# Patient Record
Sex: Female | Born: 1970 | Race: White | Hispanic: No | Smoking: Former smoker
Health system: Southern US, Community
[De-identification: ages and names within clinical notes are randomized; demographics above are authoritative.]

## PROBLEM LIST (undated history)

## (undated) DIAGNOSIS — C801 Malignant (primary) neoplasm, unspecified: Secondary | ICD-10-CM

## (undated) DIAGNOSIS — J449 Chronic obstructive pulmonary disease, unspecified: Secondary | ICD-10-CM

## (undated) DIAGNOSIS — F419 Anxiety disorder, unspecified: Secondary | ICD-10-CM

## (undated) DIAGNOSIS — T8859XA Other complications of anesthesia, initial encounter: Secondary | ICD-10-CM

## (undated) DIAGNOSIS — M797 Fibromyalgia: Secondary | ICD-10-CM

## (undated) DIAGNOSIS — M1990 Unspecified osteoarthritis, unspecified site: Secondary | ICD-10-CM

## (undated) DIAGNOSIS — K589 Irritable bowel syndrome without diarrhea: Secondary | ICD-10-CM

## (undated) DIAGNOSIS — J189 Pneumonia, unspecified organism: Secondary | ICD-10-CM

## (undated) DIAGNOSIS — F909 Attention-deficit hyperactivity disorder, unspecified type: Secondary | ICD-10-CM

## (undated) DIAGNOSIS — IMO0001 Reserved for inherently not codable concepts without codable children: Secondary | ICD-10-CM

## (undated) DIAGNOSIS — G709 Myoneural disorder, unspecified: Secondary | ICD-10-CM

## (undated) DIAGNOSIS — G5640 Causalgia of unspecified upper limb: Secondary | ICD-10-CM

## (undated) DIAGNOSIS — T4145XA Adverse effect of unspecified anesthetic, initial encounter: Secondary | ICD-10-CM

## (undated) DIAGNOSIS — M792 Neuralgia and neuritis, unspecified: Secondary | ICD-10-CM

## (undated) DIAGNOSIS — Z8719 Personal history of other diseases of the digestive system: Secondary | ICD-10-CM

## (undated) DIAGNOSIS — F329 Major depressive disorder, single episode, unspecified: Secondary | ICD-10-CM

## (undated) DIAGNOSIS — R519 Headache, unspecified: Secondary | ICD-10-CM

## (undated) DIAGNOSIS — F32A Depression, unspecified: Secondary | ICD-10-CM

## (undated) DIAGNOSIS — J45909 Unspecified asthma, uncomplicated: Secondary | ICD-10-CM

## (undated) DIAGNOSIS — K219 Gastro-esophageal reflux disease without esophagitis: Secondary | ICD-10-CM

## (undated) DIAGNOSIS — D649 Anemia, unspecified: Secondary | ICD-10-CM

## (undated) DIAGNOSIS — R51 Headache: Secondary | ICD-10-CM

## (undated) DIAGNOSIS — Z8489 Family history of other specified conditions: Secondary | ICD-10-CM

## (undated) DIAGNOSIS — G8929 Other chronic pain: Secondary | ICD-10-CM

## (undated) HISTORY — PX: WISDOM TOOTH EXTRACTION: SHX21

## (undated) HISTORY — PX: BUNIONECTOMY: SHX129

## (undated) HISTORY — PX: COLONOSCOPY: SHX174

## (undated) HISTORY — PX: TUBAL LIGATION: SHX77

## (undated) HISTORY — PX: DILATION AND CURETTAGE OF UTERUS: SHX78

## (undated) HISTORY — PX: TONSILLECTOMY: SUR1361

## (undated) HISTORY — PX: ESOPHAGOGASTRODUODENOSCOPY: SHX1529

## (undated) HISTORY — PX: FRACTURE SURGERY: SHX138

---

## 1998-05-24 HISTORY — PX: VAGINAL HYSTERECTOMY: SUR661

## 2010-05-24 DIAGNOSIS — J189 Pneumonia, unspecified organism: Secondary | ICD-10-CM

## 2010-05-24 HISTORY — DX: Pneumonia, unspecified organism: J18.9

## 2013-12-13 ENCOUNTER — Other Ambulatory Visit: Payer: Self-pay | Admitting: Orthopedic Surgery

## 2013-12-13 DIAGNOSIS — IMO0002 Reserved for concepts with insufficient information to code with codable children: Secondary | ICD-10-CM

## 2013-12-14 ENCOUNTER — Ambulatory Visit
Admission: RE | Admit: 2013-12-14 | Discharge: 2013-12-14 | Disposition: A | Discharge status: Home / Self Care | Payer: Medicaid Other | Source: Ambulatory Visit | Attending: Orthopedic Surgery | Admitting: Orthopedic Surgery

## 2013-12-14 DIAGNOSIS — IMO0002 Reserved for concepts with insufficient information to code with codable children: Secondary | ICD-10-CM

## 2014-03-21 ENCOUNTER — Encounter (HOSPITAL_COMMUNITY): Payer: Self-pay | Admitting: Pharmacy Technician

## 2014-03-26 ENCOUNTER — Encounter (HOSPITAL_COMMUNITY): Payer: Self-pay

## 2014-03-26 ENCOUNTER — Other Ambulatory Visit (HOSPITAL_COMMUNITY): Payer: Self-pay | Admitting: *Deleted

## 2014-03-26 ENCOUNTER — Encounter (HOSPITAL_COMMUNITY)
Admission: RE | Admit: 2014-03-26 | Discharge: 2014-03-26 | Disposition: A | Discharge status: Home / Self Care | Payer: Medicaid Other | Source: Ambulatory Visit | Attending: Orthopedic Surgery | Admitting: Orthopedic Surgery

## 2014-03-26 DIAGNOSIS — X58XXXA Exposure to other specified factors, initial encounter: Secondary | ICD-10-CM | POA: Diagnosis not present

## 2014-03-26 DIAGNOSIS — S82201A Unspecified fracture of shaft of right tibia, initial encounter for closed fracture: Secondary | ICD-10-CM | POA: Diagnosis not present

## 2014-03-26 DIAGNOSIS — Z01812 Encounter for preprocedural laboratory examination: Secondary | ICD-10-CM | POA: Diagnosis not present

## 2014-03-26 HISTORY — DX: Anemia, unspecified: D64.9

## 2014-03-26 HISTORY — DX: Adverse effect of unspecified anesthetic, initial encounter: T41.45XA

## 2014-03-26 HISTORY — DX: Chronic obstructive pulmonary disease, unspecified: J44.9

## 2014-03-26 HISTORY — DX: Irritable bowel syndrome, unspecified: K58.9

## 2014-03-26 HISTORY — DX: Malignant (primary) neoplasm, unspecified: C80.1

## 2014-03-26 HISTORY — DX: Unspecified asthma, uncomplicated: J45.909

## 2014-03-26 HISTORY — DX: Myoneural disorder, unspecified: G70.9

## 2014-03-26 HISTORY — DX: Anxiety disorder, unspecified: F41.9

## 2014-03-26 HISTORY — DX: Pneumonia, unspecified organism: J18.9

## 2014-03-26 HISTORY — DX: Headache: R51

## 2014-03-26 HISTORY — DX: Personal history of other diseases of the digestive system: Z87.19

## 2014-03-26 HISTORY — DX: Other complications of anesthesia, initial encounter: T88.59XA

## 2014-03-26 HISTORY — DX: Attention-deficit hyperactivity disorder, unspecified type: F90.9

## 2014-03-26 HISTORY — DX: Unspecified osteoarthritis, unspecified site: M19.90

## 2014-03-26 HISTORY — DX: Headache, unspecified: R51.9

## 2014-03-26 HISTORY — DX: Gastro-esophageal reflux disease without esophagitis: K21.9

## 2014-03-26 LAB — COMPREHENSIVE METABOLIC PANEL
ALT: 33 U/L (ref 0–35)
ANION GAP: 14 (ref 5–15)
AST: 31 U/L (ref 0–37)
Albumin: 4.2 g/dL (ref 3.5–5.2)
Alkaline Phosphatase: 97 U/L (ref 39–117)
BUN: 10 mg/dL (ref 6–23)
CALCIUM: 9.4 mg/dL (ref 8.4–10.5)
CO2: 20 mEq/L (ref 19–32)
Chloride: 104 mEq/L (ref 96–112)
Creatinine, Ser: 0.63 mg/dL (ref 0.50–1.10)
GFR calc non Af Amer: >90 mL/min (ref >90)
Glucose, Bld: 103 mg/dL — ABNORMAL HIGH (ref 70–99)
Potassium: 4.2 mEq/L (ref 3.7–5.3)
Sodium: 138 mEq/L (ref 137–147)
TOTAL PROTEIN: 7.6 g/dL (ref 6.0–8.3)
Total Bilirubin: 0.3 mg/dL (ref 0.3–1.2)

## 2014-03-26 LAB — SEDIMENTATION RATE: SED RATE: 3 mm/h (ref 0–22)

## 2014-03-26 LAB — CBC WITH DIFFERENTIAL/PLATELET
Basophils Absolute: 0 10*3/uL (ref 0.0–0.1)
Basophils Relative: 0 % (ref 0–1)
EOS ABS: 0.1 10*3/uL (ref 0.0–0.7)
EOS PCT: 1 % (ref 0–5)
HCT: 46.3 % — ABNORMAL HIGH (ref 36.0–46.0)
HEMOGLOBIN: 16.4 g/dL — AB (ref 12.0–15.0)
Lymphocytes Relative: 29 % (ref 12–46)
Lymphs Abs: 2 10*3/uL (ref 0.7–4.0)
MCH: 32 pg (ref 26.0–34.0)
MCHC: 35.4 g/dL (ref 30.0–36.0)
MCV: 90.4 fL (ref 78.0–100.0)
MONOS PCT: 8 % (ref 3–12)
Monocytes Absolute: 0.6 10*3/uL (ref 0.1–1.0)
Neutro Abs: 4.4 10*3/uL (ref 1.7–7.7)
Neutrophils Relative %: 62 % (ref 43–77)
PLATELETS: 312 10*3/uL (ref 150–400)
RBC: 5.12 MIL/uL — ABNORMAL HIGH (ref 3.87–5.11)
RDW: 12.3 % (ref 11.5–15.5)
WBC: 7.1 10*3/uL (ref 4.0–10.5)

## 2014-03-26 LAB — APTT: aPTT: 34 seconds (ref 24–37)

## 2014-03-26 LAB — C-REACTIVE PROTEIN: CRP: <0.5 mg/dL — ABNORMAL LOW (ref <0.60)

## 2014-03-26 LAB — PROTIME-INR
INR: 1.05 (ref 0.00–1.49)
PROTHROMBIN TIME: 13.8 s (ref 11.6–15.2)

## 2014-03-26 NOTE — Progress Notes (Signed)
No pre-op orders in EPIC. Called Dr. Carlean Jews office and requested orders. Spoke with [employer].

## 2014-03-26 NOTE — Pre-Procedure Instructions (Signed)
Ivianna Notch  03/26/2014   Your procedure is scheduled on:  Tuesday, April 02, 2014 at 8:00 AM.   Report to [facility] Entrance "A" Admitting Office at 6:00 AM.   Call this number if you have problems the morning of surgery: [phone]   Remember:   Do not eat food or drink liquids after midnight Monday, 04/01/14   Take these medicines the morning of surgery with A SIP OF WATER: diazepam (VALIUM) - if needed, albuterol (PROVENTIL HFA;VENTOLIN HFA) - if needed, acetaminophen (TYLENOL) - if needed.   Do not wear jewelry, make-up or nail polish.  Do not wear lotions, powders, or perfumes. You may wear deodorant.  Do not shave 48 hours prior to surgery.   Do not bring valuables to the hospital.  [facility] is not responsible                  for any belongings or valuables.               Contacts, dentures or bridgework may not be worn into surgery.  Leave suitcase in the car. After surgery it may be brought to your room.  For patients admitted to the hospital, discharge time is determined by your                treatment team.              Special Instructions: [facility] - Preparing for Surgery  Before surgery, you can play an important role.  Because skin is not sterile, your skin needs to be as free of germs as possible.  You can reduce the number of germs on you skin by washing with CHG (chlorahexidine gluconate) soap before surgery.  CHG is an antiseptic cleaner which kills germs and bonds with the skin to continue killing germs even after washing.  Please DO NOT use if you have an allergy to CHG or antibacterial soaps.  If your skin becomes reddened/irritated stop using the CHG and inform your nurse when you arrive at Short Stay.  Do not shave (including legs and underarms) for at least 48 hours prior to the first CHG shower.  You may shave your face.  Please follow these instructions carefully:   1.  Shower with CHG Soap the night before surgery and the                                 morning of Surgery.  2.  If you choose to wash your hair, wash your hair first as usual with your       normal shampoo.  3.  After you shampoo, rinse your hair and body thoroughly to remove the                      Shampoo.  4.  Use CHG as you would any other liquid soap.  You can apply chg directly       to the skin and wash gently with scrungie or a clean washcloth.  5.  Apply the CHG Soap to your body ONLY FROM THE NECK DOWN.        Do not use on open wounds or open sores.  Avoid contact with your eyes, ears, mouth and genitals (private parts).  Wash genitals (private parts) with your normal soap.  6.  Wash thoroughly, paying special attention to the area where your surgery  will be performed.  7.  Thoroughly rinse your body with warm water from the neck down.  8.  DO NOT shower/wash with your normal soap after using and rinsing off       the CHG Soap.  9.  Pat yourself dry with a clean towel.            10.  Wear clean pajamas.            11.  Place clean sheets on your bed the night of your first shower and do not        sleep with pets.  Day of Surgery  Do not apply any lotions the morning of surgery.  Please wear clean clothes to the hospital.     Please read over the following fact sheets that you were given: Pain Booklet, Coughing and Deep Breathing and Surgical Site Infection Prevention

## 2014-03-26 NOTE — Progress Notes (Signed)
Pt's PCP is [place], [place] with [facility] in [place], [place].

## 2014-03-27 LAB — VITAMIN D 25 HYDROXY (VIT D DEFICIENCY, FRACTURES): VIT D 25 HYDROXY: 52 ng/mL (ref 30–89)

## 2014-03-29 LAB — VITAMIN D 1,25 DIHYDROXY
VITAMIN D3 1, 25 (OH): 42 pg/mL
Vitamin D 1, 25 (OH)2 Total: 42 pg/mL (ref 18–72)
Vitamin D2 1, 25 (OH)2: <8 pg/mL

## 2014-03-29 LAB — NICOTINE/COTININE METABOLITES: Cotinine: <10 ng/mL

## 2014-04-01 MED ORDER — CEFAZOLIN SODIUM-DEXTROSE 2-3 GM-% IV SOLR
2.0000 g | Freq: Once | INTRAVENOUS | Status: AC
  Administered 2014-04-02: 2 g via INTRAVENOUS
  Filled 2014-04-01: qty 50

## 2014-04-01 MED ORDER — ACETAMINOPHEN 500 MG PO TABS
1000.0000 mg | ORAL_TABLET | Freq: Once | ORAL | Status: AC
  Administered 2014-04-02: 1000 mg via ORAL
  Filled 2014-04-01: qty 2

## 2014-04-01 NOTE — H&P (Signed)
[facility] H&P   Chief Complaint:  R distal tibia nonunion  HPI:   44 y/o white female sustained R distal tibia fracture approximately 2 years ago. Had multiple surgeries on R leg. Has persistent nonunion R distal tibia fracture. Has had revision IMN performed as well w/o success. Pt has long history of smoking but has recently quit and this has been confirmed on PAT labs.  Pt presents today for ORIF R distal tibial nonunion with ICBG autografting  Past Medical History  Diagnosis Date  . COPD (chronic obstructive pulmonary disease)   . Asthma   . Pneumonia   . Anxiety   . GERD (gastroesophageal reflux disease)     years ago  . History of hiatal hernia     years ago  . Headache     migraines   . Neuromuscular disorder     nerve damage in right leg  . Arthritis   . Cancer     uterine cancer, hysterectomy - ?2005  . Anemia     years ago  . Irritable bowel syndrome   . Complication of anesthesia     woke up with an anxiety attack with last surgery (2013)  . ADHD (attention deficit hyperactivity disorder)     Past Surgical History  Procedure Laterality Date  . Tonsillectomy    . Tubal ligation    . Vaginal hysterectomy    . Bunionectomy Bilateral   . Esophagogastroduodenoscopy    . Fracture surgery      right tibia  . Dilation and curettage of uterus    . Wisdom tooth extraction    . Colonoscopy      Family History  Problem Relation Age of Onset  . Lung cancer Mother   . Heart disease Father   . Alcoholism Father    Social History:  reports that she quit smoking about 4 months ago. Her smoking use included Cigarettes. She smoked 0.00 packs per day. She has never used smokeless tobacco. She reports that she drinks alcohol. She reports that she does not use illicit drugs.  Allergies: No Known Allergies  No prescriptions prior to admission    Labs Results for GISEL, VIPOND (MRN [id number]) as of 04/01/2014 21:51  Ref. Range 03/26/2014 15:07   Sodium Latest Range: 137-147 mEq/L 138  Potassium Latest Range: 3.7-5.3 mEq/L 4.2  Chloride Latest Range: 96-112 mEq/L 104  CO2 Latest Range: 19-32 mEq/L 20  BUN Latest Range: 6-23 mg/dL 10  Creatinine Latest Range: 0.50-1.10 mg/dL 0.63  Calcium Latest Range: 8.4-10.5 mg/dL 9.4  GFR calc non Af Amer Latest Range: >90 mL/min >90  GFR calc Af Amer Latest Range: >90 mL/min >90  Glucose Latest Range: 70-99 mg/dL 103 (H)  Anion gap Latest Range: 5-15  14  Alkaline Phosphatase Latest Range: 39-117 U/L 97  Albumin Latest Range: 3.5-5.2 g/dL 4.2  AST Latest Range: 0-37 U/L 31  ALT Latest Range: 0-35 U/L 33  Total Protein Latest Range: 6.0-8.3 g/dL 7.6  Total Bilirubin Latest Range: 0.3-1.2 mg/dL 0.3  CRP Latest Range: <0.60 mg/dL <0.5 (L)  Vit D, 25-Hydroxy Latest Range: 30-89 ng/mL 52  Vitamin D 1, 25 (OH) Total Latest Range: 18-72 pg/mL 42  Vitamin D2 1, 25 (OH) No range found <8  Vitamin D3 1, 25 (OH) No range found 42  WBC Latest Range: 4.0-10.5 K/uL 7.1  RBC Latest Range: 3.87-5.11 MIL/uL 5.12 (H)  Hemoglobin Latest Range: 12.0-15.0 g/dL 16.4 (H)  HCT Latest Range: 36.0-46.0 % 46.3 (H)  MCV Latest Range: 78.0-100.0 fL 90.4  MCH Latest Range: 26.0-34.0 pg 32.0  MCHC Latest Range: 30.0-36.0 g/dL 35.4  RDW Latest Range: 11.5-15.5 % 12.3  Platelets Latest Range: 150-400 K/uL 312  Neutrophils Relative % Latest Range: 43-77 % 62  Lymphocytes Relative Latest Range: 12-46 % 29  Monocytes Relative Latest Range: 3-12 % 8  Eosinophils Relative Latest Range: 0-5 % 1  Basophils Relative Latest Range: 0-1 % 0  NEUT# Latest Range: 1.7-7.7 K/uL 4.4  Lymphocytes Absolute Latest Range: 0.7-4.0 K/uL 2.0  Monocytes Absolute Latest Range: 0.1-1.0 K/uL 0.6  Eosinophils Absolute Latest Range: 0.0-0.7 K/uL 0.1  Basophils Absolute Latest Range: 0.0-0.1 K/uL 0.0  Sed Rate Latest Range: 0-22 mm/hr 3  Prothrombin Time Latest Range: 11.6-15.2 seconds 13.8  INR Latest Range: 0.00-1.49  1.05  APTT Latest  Range: 24-37 seconds 34  Cotinine No range found <10    Review of Systems  Constitutional: Negative for fever and chills.  Respiratory: Negative for shortness of breath and wheezing.   Cardiovascular: Negative for chest pain and palpitations.  Gastrointestinal: Negative for nausea, vomiting and abdominal pain.  Genitourinary: Negative for dysuria and urgency.  Musculoskeletal:       Persistent R ankle pain   Neurological: Positive for sensory change.       Decreased DPN and SPN sensation R foot    AF and vital signs are stable in office Updated vitals on arrival to pre-op  Physical Exam  Constitutional: She appears well-developed and well-nourished.  HENT:  Head: Normocephalic and atraumatic.  Eyes: EOM are normal. Pupils are equal, round, and reactive to light.  Cardiovascular: Normal rate, regular rhythm and normal heart sounds.   No murmur heard. Respiratory: Effort normal and breath sounds normal. She has no wheezes. She has no rales.  GI: Soft. Bowel sounds are normal. There is no tenderness.  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

## 2014-04-02 ENCOUNTER — Inpatient Hospital Stay (HOSPITAL_COMMUNITY): Payer: Medicaid Other

## 2014-04-02 ENCOUNTER — Encounter (HOSPITAL_COMMUNITY)
Admission: RE | Disposition: A | Discharge status: Home / Self Care | Payer: Self-pay | Source: Ambulatory Visit | Attending: Orthopedic Surgery

## 2014-04-02 ENCOUNTER — Inpatient Hospital Stay (HOSPITAL_COMMUNITY)
Admission: RE | Admit: 2014-04-02 | Discharge: 2014-04-05 | DRG: 493 | Disposition: A | Discharge status: Home / Self Care | Payer: Medicaid Other | Source: Ambulatory Visit | Attending: Orthopedic Surgery | Admitting: Orthopedic Surgery

## 2014-04-02 ENCOUNTER — Inpatient Hospital Stay (HOSPITAL_COMMUNITY): Payer: Medicaid Other | Admitting: Anesthesiology

## 2014-04-02 ENCOUNTER — Encounter (HOSPITAL_COMMUNITY): Payer: Self-pay | Admitting: *Deleted

## 2014-04-02 ENCOUNTER — Observation Stay (HOSPITAL_COMMUNITY): Payer: Medicaid Other

## 2014-04-02 DIAGNOSIS — J45909 Unspecified asthma, uncomplicated: Secondary | ICD-10-CM | POA: Diagnosis present

## 2014-04-02 DIAGNOSIS — X58XXXD Exposure to other specified factors, subsequent encounter: Secondary | ICD-10-CM | POA: Diagnosis present

## 2014-04-02 DIAGNOSIS — S82301K Unspecified fracture of lower end of right tibia, subsequent encounter for closed fracture with nonunion: Principal | ICD-10-CM

## 2014-04-02 DIAGNOSIS — S82201K Unspecified fracture of shaft of right tibia, subsequent encounter for closed fracture with nonunion: Secondary | ICD-10-CM

## 2014-04-02 DIAGNOSIS — F419 Anxiety disorder, unspecified: Secondary | ICD-10-CM | POA: Diagnosis present

## 2014-04-02 DIAGNOSIS — Z419 Encounter for procedure for purposes other than remedying health state, unspecified: Secondary | ICD-10-CM

## 2014-04-02 DIAGNOSIS — S82209A Unspecified fracture of shaft of unspecified tibia, initial encounter for closed fracture: Secondary | ICD-10-CM | POA: Diagnosis present

## 2014-04-02 DIAGNOSIS — F909 Attention-deficit hyperactivity disorder, unspecified type: Secondary | ICD-10-CM | POA: Diagnosis present

## 2014-04-02 DIAGNOSIS — D62 Acute posthemorrhagic anemia: Secondary | ICD-10-CM | POA: Diagnosis not present

## 2014-04-02 DIAGNOSIS — Z87891 Personal history of nicotine dependence: Secondary | ICD-10-CM

## 2014-04-02 DIAGNOSIS — K219 Gastro-esophageal reflux disease without esophagitis: Secondary | ICD-10-CM | POA: Diagnosis present

## 2014-04-02 DIAGNOSIS — J449 Chronic obstructive pulmonary disease, unspecified: Secondary | ICD-10-CM | POA: Diagnosis present

## 2014-04-02 HISTORY — DX: Family history of other specified conditions: Z84.89

## 2014-04-02 HISTORY — PX: HARDWARE REMOVAL: SHX979

## 2014-04-02 HISTORY — PX: HARVEST BONE GRAFT: SHX377

## 2014-04-02 HISTORY — PX: ORIF TIBIA FRACTURE: SHX5416

## 2014-04-02 LAB — CBC
HCT: 35.1 % — ABNORMAL LOW (ref 36.0–46.0)
Hemoglobin: 12.3 g/dL (ref 12.0–15.0)
MCH: 31.8 pg (ref 26.0–34.0)
MCHC: 35 g/dL (ref 30.0–36.0)
MCV: 90.7 fL (ref 78.0–100.0)
Platelets: 270 10*3/uL (ref 150–400)
RBC: 3.87 MIL/uL (ref 3.87–5.11)
RDW: 12.8 % (ref 11.5–15.5)
WBC: 10.6 10*3/uL — AB (ref 4.0–10.5)

## 2014-04-02 LAB — URINALYSIS, ROUTINE W REFLEX MICROSCOPIC
Bilirubin Urine: NEGATIVE
Glucose, UA: NEGATIVE mg/dL
Hgb urine dipstick: NEGATIVE
KETONES UR: 15 mg/dL — AB
LEUKOCYTES UA: NEGATIVE
NITRITE: NEGATIVE
PROTEIN: NEGATIVE mg/dL
Specific Gravity, Urine: 1.022 (ref 1.005–1.030)
UROBILINOGEN UA: 0.2 mg/dL (ref 0.0–1.0)
pH: 5.5 (ref 5.0–8.0)

## 2014-04-02 LAB — CREATININE, SERUM
CREATININE: 0.53 mg/dL (ref 0.50–1.10)
GFR calc Af Amer: >90 mL/min (ref >90)
GFR calc non Af Amer: >90 mL/min (ref >90)

## 2014-04-02 SURGERY — OPEN REDUCTION INTERNAL FIXATION (ORIF) TIBIA FRACTURE
Anesthesia: General | Laterality: Right

## 2014-04-02 MED ORDER — METOCLOPRAMIDE HCL 5 MG/ML IJ SOLN
5.0000 mg | Freq: Three times a day (TID) | INTRAMUSCULAR | Status: DC | PRN
  Administered 2014-04-03: 10 mg via INTRAVENOUS
  Filled 2014-04-02: qty 2

## 2014-04-02 MED ORDER — DOCUSATE SODIUM 100 MG PO CAPS
100.0000 mg | ORAL_CAPSULE | Freq: Two times a day (BID) | ORAL | Status: DC
  Administered 2014-04-02 – 2014-04-05 (×7): 100 mg via ORAL
  Filled 2014-04-02 (×7): qty 1

## 2014-04-02 MED ORDER — ENOXAPARIN SODIUM 40 MG/0.4ML ~~LOC~~ SOLN
40.0000 mg | SUBCUTANEOUS | Status: DC
  Administered 2014-04-03 – 2014-04-05 (×3): 40 mg via SUBCUTANEOUS
  Filled 2014-04-02 (×4): qty 0.4

## 2014-04-02 MED ORDER — GLYCOPYRROLATE 0.2 MG/ML IJ SOLN
INTRAMUSCULAR | Status: DC | PRN
  Administered 2014-04-02: 0.6 mg via INTRAVENOUS

## 2014-04-02 MED ORDER — PHENYLEPHRINE HCL 10 MG/ML IJ SOLN
10.0000 mg | INTRAVENOUS | Status: DC | PRN
  Administered 2014-04-02: 20 ug/min via INTRAVENOUS

## 2014-04-02 MED ORDER — ARTIFICIAL TEARS OP OINT
TOPICAL_OINTMENT | OPHTHALMIC | Status: DC | PRN
  Administered 2014-04-02: 1 via OPHTHALMIC

## 2014-04-02 MED ORDER — OXYCODONE HCL 5 MG/5ML PO SOLN: 5.0000 mg | Freq: Once | ORAL | Status: DC | PRN

## 2014-04-02 MED ORDER — PROPOFOL 10 MG/ML IV BOLUS
INTRAVENOUS | Status: DC | PRN
Start: 2014-04-02 — End: 2014-04-02
  Administered 2014-04-02: 130 mg via INTRAVENOUS

## 2014-04-02 MED ORDER — LIDOCAINE HCL (CARDIAC) 20 MG/ML IV SOLN
INTRAVENOUS | Status: DC | PRN
  Administered 2014-04-02: 20 mg via INTRAVENOUS

## 2014-04-02 MED ORDER — MORPHINE SULFATE 2 MG/ML IJ SOLN
1.0000 mg | INTRAMUSCULAR | Status: DC | PRN
Start: 2014-04-02 — End: 2014-04-03

## 2014-04-02 MED ORDER — BISACODYL 5 MG PO TBEC: 5.0000 mg | DELAYED_RELEASE_TABLET | Freq: Every day | ORAL | Status: DC | PRN

## 2014-04-02 MED ORDER — PROPOFOL 10 MG/ML IV BOLUS
INTRAVENOUS | Status: AC
  Filled 2014-04-02: qty 20

## 2014-04-02 MED ORDER — ONDANSETRON HCL 4 MG/2ML IJ SOLN
4.0000 mg | Freq: Four times a day (QID) | INTRAMUSCULAR | Status: DC | PRN
  Administered 2014-04-04 (×2): 4 mg via INTRAVENOUS
  Filled 2014-04-02 (×2): qty 2

## 2014-04-02 MED ORDER — METOCLOPRAMIDE HCL 10 MG PO TABS: 5.0000 mg | ORAL_TABLET | Freq: Three times a day (TID) | ORAL | Status: DC | PRN

## 2014-04-02 MED ORDER — POTASSIUM CHLORIDE IN NACL 20-0.9 MEQ/L-% IV SOLN
INTRAVENOUS | Status: DC
  Administered 2014-04-02: 18:00:00 via INTRAVENOUS
  Filled 2014-04-02 (×4): qty 1000

## 2014-04-02 MED ORDER — DIPHENHYDRAMINE HCL 50 MG/ML IJ SOLN: 12.5000 mg | Freq: Four times a day (QID) | INTRAMUSCULAR | Status: DC | PRN

## 2014-04-02 MED ORDER — CHLORHEXIDINE GLUCONATE 4 % EX LIQD
60.0000 mL | Freq: Once | CUTANEOUS | Status: DC
  Filled 2014-04-02: qty 60

## 2014-04-02 MED ORDER — MORPHINE SULFATE (PF) 1 MG/ML IV SOLN
INTRAVENOUS | Status: DC
  Administered 2014-04-02: 9 mg via INTRAVENOUS
  Administered 2014-04-02: 13:00:00 via INTRAVENOUS
  Administered 2014-04-02: 6.5 mg via INTRAVENOUS
  Administered 2014-04-03: via INTRAVENOUS
  Administered 2014-04-03: 6.49 mg via INTRAVENOUS
  Administered 2014-04-03: 10 mg via INTRAVENOUS
  Administered 2014-04-03: 8.7 mg via INTRAVENOUS
  Administered 2014-04-03: 18 mg via INTRAVENOUS
  Administered 2014-04-03 – 2014-04-04 (×2): via INTRAVENOUS
  Administered 2014-04-04: 16 mg via INTRAVENOUS
  Filled 2014-04-02 (×4): qty 25

## 2014-04-02 MED ORDER — DIAZEPAM 5 MG PO TABS
5.0000 mg | ORAL_TABLET | Freq: Two times a day (BID) | ORAL | Status: DC | PRN
  Administered 2014-04-02 – 2014-04-05 (×7): 5 mg via ORAL
  Filled 2014-04-02 (×8): qty 1

## 2014-04-02 MED ORDER — DIPHENHYDRAMINE HCL 50 MG/ML IJ SOLN
INTRAMUSCULAR | Status: AC
  Filled 2014-04-02: qty 1

## 2014-04-02 MED ORDER — PHENYLEPHRINE 40 MCG/ML (10ML) SYRINGE FOR IV PUSH (FOR BLOOD PRESSURE SUPPORT)
PREFILLED_SYRINGE | INTRAVENOUS | Status: AC
  Filled 2014-04-02: qty 10

## 2014-04-02 MED ORDER — METHOCARBAMOL 1000 MG/10ML IJ SOLN
1000.0000 mg | Freq: Four times a day (QID) | INTRAVENOUS | Status: DC | PRN
  Filled 2014-04-02: qty 10

## 2014-04-02 MED ORDER — BUPIVACAINE-EPINEPHRINE (PF) 0.5% -1:200000 IJ SOLN
INTRAMUSCULAR | Status: DC | PRN
  Administered 2014-04-02: 30 mL via PERINEURAL

## 2014-04-02 MED ORDER — MIDAZOLAM HCL 5 MG/5ML IJ SOLN
INTRAMUSCULAR | Status: DC | PRN
  Administered 2014-04-02 (×3): 1 mg via INTRAVENOUS

## 2014-04-02 MED ORDER — PNEUMOCOCCAL VAC POLYVALENT 25 MCG/0.5ML IJ INJ
0.5000 mL | INJECTION | INTRAMUSCULAR | Status: AC
  Administered 2014-04-03: 0.5 mL via INTRAMUSCULAR
  Filled 2014-04-02 (×2): qty 0.5

## 2014-04-02 MED ORDER — ALBUTEROL SULFATE HFA 108 (90 BASE) MCG/ACT IN AERS: 2.0000 | INHALATION_SPRAY | Freq: Four times a day (QID) | RESPIRATORY_TRACT | Status: DC | PRN

## 2014-04-02 MED ORDER — ALBUTEROL SULFATE (2.5 MG/3ML) 0.083% IN NEBU: 2.5000 mg | INHALATION_SOLUTION | Freq: Four times a day (QID) | RESPIRATORY_TRACT | Status: DC | PRN

## 2014-04-02 MED ORDER — LACTATED RINGERS IV SOLN: INTRAVENOUS | Status: DC

## 2014-04-02 MED ORDER — NALOXONE HCL 0.4 MG/ML IJ SOLN: 0.4000 mg | INTRAMUSCULAR | Status: DC | PRN

## 2014-04-02 MED ORDER — MORPHINE SULFATE (PF) 1 MG/ML IV SOLN
INTRAVENOUS | Status: AC
  Filled 2014-04-02: qty 25

## 2014-04-02 MED ORDER — OXYCODONE HCL 5 MG PO TABS: 5.0000 mg | ORAL_TABLET | ORAL | Status: DC | PRN

## 2014-04-02 MED ORDER — GLYCOPYRROLATE 0.2 MG/ML IJ SOLN
INTRAMUSCULAR | Status: AC
  Filled 2014-04-02: qty 2

## 2014-04-02 MED ORDER — HYDROMORPHONE HCL 1 MG/ML IJ SOLN
0.5000 mg | INTRAMUSCULAR | Status: DC | PRN
  Administered 2014-04-02: 1 mg via INTRAVENOUS

## 2014-04-02 MED ORDER — MONTELUKAST SODIUM 10 MG PO TABS
10.0000 mg | ORAL_TABLET | Freq: Every day | ORAL | Status: DC
  Administered 2014-04-02 – 2014-04-04 (×3): 10 mg via ORAL
  Filled 2014-04-02 (×5): qty 1

## 2014-04-02 MED ORDER — SODIUM CHLORIDE 0.9 % IJ SOLN
INTRAMUSCULAR | Status: DC | PRN
  Administered 2014-04-02: 20 mL

## 2014-04-02 MED ORDER — HYDROMORPHONE HCL 1 MG/ML IJ SOLN
INTRAMUSCULAR | Status: AC
  Filled 2014-04-02: qty 1

## 2014-04-02 MED ORDER — ONDANSETRON HCL 4 MG/2ML IJ SOLN
INTRAMUSCULAR | Status: DC | PRN
  Administered 2014-04-02: 4 mg via INTRAVENOUS

## 2014-04-02 MED ORDER — FENTANYL CITRATE 0.05 MG/ML IJ SOLN
INTRAMUSCULAR | Status: DC | PRN
  Administered 2014-04-02 (×3): 50 ug via INTRAVENOUS
  Administered 2014-04-02: 100 ug via INTRAVENOUS
  Administered 2014-04-02 (×3): 50 ug via INTRAVENOUS

## 2014-04-02 MED ORDER — HYDROMORPHONE HCL 1 MG/ML IJ SOLN
0.2500 mg | INTRAMUSCULAR | Status: DC | PRN
  Administered 2014-04-02 (×2): 1 mg via INTRAVENOUS

## 2014-04-02 MED ORDER — SODIUM CHLORIDE 0.9 % IJ SOLN: 9.0000 mL | INTRAMUSCULAR | Status: DC | PRN

## 2014-04-02 MED ORDER — MIDAZOLAM HCL 2 MG/2ML IJ SOLN
INTRAMUSCULAR | Status: AC
  Filled 2014-04-02: qty 2

## 2014-04-02 MED ORDER — ONDANSETRON HCL 4 MG PO TABS: 4.0000 mg | ORAL_TABLET | Freq: Four times a day (QID) | ORAL | Status: DC | PRN

## 2014-04-02 MED ORDER — ROCURONIUM BROMIDE 50 MG/5ML IV SOLN
INTRAVENOUS | Status: AC
  Filled 2014-04-02: qty 2

## 2014-04-02 MED ORDER — DEXTROSE 5 % IV SOLN
INTRAVENOUS | Status: DC | PRN
  Administered 2014-04-02: 08:00:00 via INTRAVENOUS

## 2014-04-02 MED ORDER — ONDANSETRON HCL 4 MG/2ML IJ SOLN
INTRAMUSCULAR | Status: AC
  Filled 2014-04-02: qty 2

## 2014-04-02 MED ORDER — DEXAMETHASONE SODIUM PHOSPHATE 4 MG/ML IJ SOLN
INTRAMUSCULAR | Status: DC | PRN
  Administered 2014-04-02: 8 mg via INTRAVENOUS

## 2014-04-02 MED ORDER — NEOSTIGMINE METHYLSULFATE 10 MG/10ML IV SOLN
INTRAVENOUS | Status: DC | PRN
  Administered 2014-04-02: 4 mg via INTRAVENOUS

## 2014-04-02 MED ORDER — INFLUENZA VAC SPLIT QUAD 0.5 ML IM SUSY
0.5000 mL | PREFILLED_SYRINGE | INTRAMUSCULAR | Status: AC
  Administered 2014-04-03: 0.5 mL via INTRAMUSCULAR
  Filled 2014-04-02: qty 0.5

## 2014-04-02 MED ORDER — OXYCODONE HCL 5 MG PO TABS: 5.0000 mg | ORAL_TABLET | Freq: Once | ORAL | Status: DC | PRN

## 2014-04-02 MED ORDER — GLYCOPYRROLATE 0.2 MG/ML IJ SOLN
INTRAMUSCULAR | Status: AC
  Filled 2014-04-02: qty 1

## 2014-04-02 MED ORDER — POLYETHYLENE GLYCOL 3350 17 G PO PACK
17.0000 g | PACK | Freq: Every day | ORAL | Status: DC
  Administered 2014-04-03 – 2014-04-05 (×3): 17 g via ORAL
  Filled 2014-04-02 (×4): qty 1

## 2014-04-02 MED ORDER — FENTANYL CITRATE 0.05 MG/ML IJ SOLN
INTRAMUSCULAR | Status: AC
  Filled 2014-04-02: qty 5

## 2014-04-02 MED ORDER — BUPIVACAINE HCL (PF) 0.5 % IJ SOLN
INTRAMUSCULAR | Status: DC | PRN
  Administered 2014-04-02: 10 mL

## 2014-04-02 MED ORDER — DIPHENHYDRAMINE HCL 50 MG/ML IJ SOLN
INTRAMUSCULAR | Status: DC | PRN
  Administered 2014-04-02: 12.5 mg via INTRAVENOUS

## 2014-04-02 MED ORDER — DEXAMETHASONE SODIUM PHOSPHATE 4 MG/ML IJ SOLN
INTRAMUSCULAR | Status: AC
  Filled 2014-04-02: qty 2

## 2014-04-02 MED ORDER — OXYCODONE-ACETAMINOPHEN 5-325 MG PO TABS
1.0000 | ORAL_TABLET | Freq: Four times a day (QID) | ORAL | Status: DC | PRN
  Administered 2014-04-02 – 2014-04-03 (×3): 2 via ORAL
  Administered 2014-04-03: 1 via ORAL
  Administered 2014-04-04 – 2014-04-05 (×5): 2 via ORAL
  Filled 2014-04-02 (×5): qty 2
  Filled 2014-04-02: qty 1
  Filled 2014-04-02 (×3): qty 2

## 2014-04-02 MED ORDER — ARTIFICIAL TEARS OP OINT
TOPICAL_OINTMENT | OPHTHALMIC | Status: AC
  Filled 2014-04-02: qty 3.5

## 2014-04-02 MED ORDER — CEFAZOLIN SODIUM 1-5 GM-% IV SOLN
1.0000 g | Freq: Four times a day (QID) | INTRAVENOUS | Status: AC
  Administered 2014-04-02 – 2014-04-03 (×3): 1 g via INTRAVENOUS
  Filled 2014-04-02 (×4): qty 50

## 2014-04-02 MED ORDER — ROCURONIUM BROMIDE 100 MG/10ML IV SOLN
INTRAVENOUS | Status: DC | PRN
  Administered 2014-04-02 (×2): 10 mg via INTRAVENOUS
  Administered 2014-04-02: 50 mg via INTRAVENOUS

## 2014-04-02 MED ORDER — LACTATED RINGERS IV SOLN
INTRAVENOUS | Status: DC | PRN
  Administered 2014-04-02 (×3): via INTRAVENOUS

## 2014-04-02 MED ORDER — LIDOCAINE HCL (CARDIAC) 20 MG/ML IV SOLN
INTRAVENOUS | Status: AC
  Filled 2014-04-02: qty 5

## 2014-04-02 MED ORDER — MIDAZOLAM HCL 2 MG/2ML IJ SOLN
1.0000 mg | Freq: Once | INTRAMUSCULAR | Status: AC
  Administered 2014-04-02: 1 mg via INTRAVENOUS

## 2014-04-02 MED ORDER — DIPHENHYDRAMINE HCL 12.5 MG/5ML PO ELIX: 12.5000 mg | ORAL_SOLUTION | Freq: Four times a day (QID) | ORAL | Status: DC | PRN

## 2014-04-02 MED ORDER — ONDANSETRON HCL 4 MG/2ML IJ SOLN
4.0000 mg | Freq: Four times a day (QID) | INTRAMUSCULAR | Status: DC | PRN
Start: 2014-04-02 — End: 2014-04-04
  Filled 2014-04-02: qty 2

## 2014-04-02 MED ORDER — HEMOSTATIC AGENTS (NO CHARGE) OPTIME
TOPICAL | Status: DC | PRN
  Administered 2014-04-02: 1 via TOPICAL

## 2014-04-02 MED ORDER — MAGNESIUM CITRATE PO SOLN: 1.0000 | Freq: Once | ORAL | Status: AC | PRN

## 2014-04-02 MED ORDER — 0.9 % SODIUM CHLORIDE (POUR BTL) OPTIME
TOPICAL | Status: DC | PRN
  Administered 2014-04-02: 1000 mL

## 2014-04-02 MED ORDER — BUPIVACAINE LIPOSOME 1.3 % IJ SUSP
20.0000 mL | INTRAMUSCULAR | Status: AC
  Administered 2014-04-02: 20 mL
  Filled 2014-04-02: qty 20

## 2014-04-02 MED ORDER — DIPHENHYDRAMINE HCL 12.5 MG/5ML PO ELIX
12.5000 mg | ORAL_SOLUTION | ORAL | Status: DC | PRN
  Administered 2014-04-04: 25 mg via ORAL
  Filled 2014-04-02: qty 10

## 2014-04-02 MED ORDER — METHOCARBAMOL 500 MG PO TABS
500.0000 mg | ORAL_TABLET | Freq: Four times a day (QID) | ORAL | Status: DC | PRN
  Administered 2014-04-02: 1000 mg via ORAL
  Administered 2014-04-03: 500 mg via ORAL
  Filled 2014-04-02: qty 2
  Filled 2014-04-02: qty 1
  Filled 2014-04-02: qty 2

## 2014-04-02 SURGICAL SUPPLY — 106 items
BANDAGE ELASTIC 4 VELCRO ST LF (GAUZE/BANDAGES/DRESSINGS) ×3 IMPLANT
BANDAGE ELASTIC 6 VELCRO ST LF (GAUZE/BANDAGES/DRESSINGS) ×3 IMPLANT
BANDAGE ESMARK 6X9 LF (GAUZE/BANDAGES/DRESSINGS) ×1 IMPLANT
BIT DRILL 2.5X2.75 QC CALB (BIT) ×3 IMPLANT
BIT DRILL CALIBRATED 2.7 (BIT) ×2 IMPLANT
BIT DRILL CALIBRATED 2.7MM (BIT) ×1
BIT DRILL SHORT 4.2 (BIT) ×1 IMPLANT
BLADE SURG 10 STRL SS (BLADE) ×3 IMPLANT
BLADE SURG 15 STRL LF DISP TIS (BLADE) ×1 IMPLANT
BLADE SURG 15 STRL SS (BLADE) ×2
BLADE SURG ROTATE 9660 (MISCELLANEOUS) IMPLANT
BNDG COHESIVE 4X5 TAN STRL (GAUZE/BANDAGES/DRESSINGS) IMPLANT
BNDG COHESIVE 6X5 TAN STRL LF (GAUZE/BANDAGES/DRESSINGS) IMPLANT
BNDG ESMARK 6X9 LF (GAUZE/BANDAGES/DRESSINGS) ×3
BNDG GAUZE ELAST 4 BULKY (GAUZE/BANDAGES/DRESSINGS) ×3 IMPLANT
BONE CANC CHIPS 40CC CAN1/2 (Bone Implant) ×3 IMPLANT
BRUSH SCRUB DISP (MISCELLANEOUS) ×6 IMPLANT
CHIPS CANC BONE 40CC CAN1/2 (Bone Implant) ×1 IMPLANT
CLEANER TIP ELECTROSURG 2X2 (MISCELLANEOUS) IMPLANT
CLOSURE WOUND 1/2 X4 (GAUZE/BANDAGES/DRESSINGS)
COVER MAYO STAND STRL (DRAPES) ×3 IMPLANT
COVER SURGICAL LIGHT HANDLE (MISCELLANEOUS) ×3 IMPLANT
CUFF TOURNIQUET SINGLE 18IN (TOURNIQUET CUFF) IMPLANT
CUFF TOURNIQUET SINGLE 24IN (TOURNIQUET CUFF) IMPLANT
CUFF TOURNIQUET SINGLE 34IN LL (TOURNIQUET CUFF) IMPLANT
DRAPE C-ARM 42X72 X-RAY (DRAPES) ×3 IMPLANT
DRAPE C-ARMOR (DRAPES) ×3 IMPLANT
DRAPE INCISE IOBAN 66X45 STRL (DRAPES) ×6 IMPLANT
DRAPE OEC MINIVIEW 54X84 (DRAPES) IMPLANT
DRAPE ORTHO SPLIT 77X108 STRL (DRAPES)
DRAPE SURG 17X23 STRL (DRAPES) ×3 IMPLANT
DRAPE SURG ORHT 6 SPLT 77X108 (DRAPES) IMPLANT
DRAPE U-SHAPE 47X51 STRL (DRAPES) ×3 IMPLANT
DRILL BIT SHORT 4.2 (BIT) ×2
DRSG ADAPTIC 3X8 NADH LF (GAUZE/BANDAGES/DRESSINGS) ×3 IMPLANT
DRSG MEPILEX BORDER 4X8 (GAUZE/BANDAGES/DRESSINGS) ×3 IMPLANT
DRSG PAD ABDOMINAL 8X10 ST (GAUZE/BANDAGES/DRESSINGS) ×3 IMPLANT
ELECT REM PT RETURN 9FT ADLT (ELECTROSURGICAL) ×3
ELECTRODE REM PT RTRN 9FT ADLT (ELECTROSURGICAL) ×1 IMPLANT
EVACUATOR 1/8 PVC DRAIN (DRAIN) IMPLANT
EVACUATOR 3/16  PVC DRAIN (DRAIN)
EVACUATOR 3/16 PVC DRAIN (DRAIN) IMPLANT
GAUZE SPONGE 4X4 12PLY STRL (GAUZE/BANDAGES/DRESSINGS) ×3 IMPLANT
GLOVE BIO SURGEON STRL SZ7.5 (GLOVE) ×3 IMPLANT
GLOVE BIO SURGEON STRL SZ8 (GLOVE) ×3 IMPLANT
GLOVE BIOGEL PI IND STRL 7.5 (GLOVE) ×1 IMPLANT
GLOVE BIOGEL PI IND STRL 8 (GLOVE) ×1 IMPLANT
GLOVE BIOGEL PI INDICATOR 7.5 (GLOVE) ×2
GLOVE BIOGEL PI INDICATOR 8 (GLOVE) ×2
GOWN STRL REUS W/ TWL LRG LVL3 (GOWN DISPOSABLE) ×2 IMPLANT
GOWN STRL REUS W/ TWL XL LVL3 (GOWN DISPOSABLE) ×2 IMPLANT
GOWN STRL REUS W/TWL LRG LVL3 (GOWN DISPOSABLE) ×4
GOWN STRL REUS W/TWL XL LVL3 (GOWN DISPOSABLE) ×4
IMMOBILIZER KNEE 22 UNIV (SOFTGOODS) IMPLANT
KIT BASIN OR (CUSTOM PROCEDURE TRAY) ×3 IMPLANT
KIT ROOM TURNOVER OR (KITS) ×3 IMPLANT
MANIFOLD NEPTUNE II (INSTRUMENTS) IMPLANT
NDL SUT .5 MAYO 1.404X.05X (NEEDLE) IMPLANT
NEEDLE 22X1 1/2 (OR ONLY) (NEEDLE) IMPLANT
NEEDLE MAYO TAPER (NEEDLE)
NS IRRIG 1000ML POUR BTL (IV SOLUTION) ×3 IMPLANT
PACK ORTHO EXTREMITY (CUSTOM PROCEDURE TRAY) ×3 IMPLANT
PAD ARMBOARD 7.5X6 YLW CONV (MISCELLANEOUS) ×6 IMPLANT
PAD CAST 4YDX4 CTTN HI CHSV (CAST SUPPLIES) ×1 IMPLANT
PADDING CAST COTTON 4X4 STRL (CAST SUPPLIES) ×2
PADDING CAST COTTON 6X4 STRL (CAST SUPPLIES) ×3 IMPLANT
PLATE 9H LT DIST ANTLAT TIB (Plate) ×2 IMPLANT
PLATE ANTLAT CNTR W 157X9 (Plate) ×1 IMPLANT
REAMER ROD DEEP FLUTE 2.5X950 (INSTRUMENTS) ×3 IMPLANT
SCREW CORTICAL 3.5MM  28MM (Screw) ×2 IMPLANT
SCREW CORTICAL 3.5MM  30MM (Screw) ×2 IMPLANT
SCREW CORTICAL 3.5MM 24MM (Screw) ×9 IMPLANT
SCREW CORTICAL 3.5MM 28MM (Screw) ×1 IMPLANT
SCREW CORTICAL 3.5MM 30MM (Screw) ×1 IMPLANT
SCREW LOCK CORT STAR 3.5X36 (Screw) ×6 IMPLANT
SCREW LOCK CORT STAR 3.5X40 (Screw) ×3 IMPLANT
SCREW LOCK CORT STAR 3.5X44 (Screw) ×3 IMPLANT
SCREW LP 3.5X44 (Screw) ×3 IMPLANT
SPLINT PLASTER CAST XFAST 5X30 (CAST SUPPLIES) ×1 IMPLANT
SPLINT PLASTER XFAST SET 5X30 (CAST SUPPLIES) ×2
SPONGE LAP 18X18 X RAY DECT (DISPOSABLE) ×3 IMPLANT
SPONGE SCRUB IODOPHOR (GAUZE/BANDAGES/DRESSINGS) ×3 IMPLANT
SPONGE SURGIFOAM ABS GEL SZ50 (HEMOSTASIS) ×3 IMPLANT
STAPLER VISISTAT 35W (STAPLE) ×3 IMPLANT
STOCKINETTE IMPERVIOUS LG (DRAPES) IMPLANT
STRIP CLOSURE SKIN 1/2X4 (GAUZE/BANDAGES/DRESSINGS) IMPLANT
SUCTION FRAZIER TIP 10 FR DISP (SUCTIONS) ×3 IMPLANT
SUT ETHILON 3 0 PS 1 (SUTURE) ×6 IMPLANT
SUT PDS AB 2-0 CT1 27 (SUTURE) IMPLANT
SUT PROLENE 0 CT 2 (SUTURE) IMPLANT
SUT VIC AB 0 CT1 27 (SUTURE) ×6
SUT VIC AB 0 CT1 27XBRD ANBCTR (SUTURE) ×3 IMPLANT
SUT VIC AB 1 CT1 27 (SUTURE)
SUT VIC AB 1 CT1 27XBRD ANBCTR (SUTURE) IMPLANT
SUT VIC AB 2-0 CT1 27 (SUTURE) ×4
SUT VIC AB 2-0 CT1 TAPERPNT 27 (SUTURE) ×2 IMPLANT
SYR 20ML ECCENTRIC (SYRINGE) IMPLANT
SYR CONTROL 10ML LL (SYRINGE) IMPLANT
TOWEL OR 17X24 6PK STRL BLUE (TOWEL DISPOSABLE) ×6 IMPLANT
TOWEL OR 17X26 10 PK STRL BLUE (TOWEL DISPOSABLE) ×6 IMPLANT
TRAY FOLEY CATH 16FRSI W/METER (SET/KITS/TRAYS/PACK) ×3 IMPLANT
TUBE CONNECTING 12'X1/4 (SUCTIONS) ×1
TUBE CONNECTING 12X1/4 (SUCTIONS) ×2 IMPLANT
UNDERPAD 30X30 INCONTINENT (UNDERPADS AND DIAPERS) ×3 IMPLANT
WATER STERILE IRR 1000ML POUR (IV SOLUTION) IMPLANT
YANKAUER SUCT BULB TIP NO VENT (SUCTIONS) ×3 IMPLANT

## 2014-04-02 NOTE — Transfer of Care (Signed)
Immediate Anesthesia Transfer of Care Note  Patient: Mieka Leaton  Procedure(s) Performed: Procedure(s): OPEN REDUCTION INTERNAL FIXATION (ORIF) RIGHT DISTAL TIBIA FRACTURE (Right) AND HARDWARE REMOVAL  (Right)  AND ILIAC BONE GRAFT (Right)  Patient Location: PACU  Anesthesia Type:General  Level of Consciousness: awake, alert  and patient cooperative  Airway & Oxygen Therapy: Patient Spontanous Breathing and Patient connected to nasal cannula oxygen  Post-op Assessment: Report given to PACU RN and Post -op Vital signs reviewed and stable  Post vital signs: Reviewed and stable  Complications: No apparent anesthesia complications

## 2014-04-02 NOTE — Anesthesia Preprocedure Evaluation (Addendum)
Anesthesia Evaluation  Patient identified by MRN, date of birth, ID band Patient awake    Reviewed: Allergy & Precautions, H&P , NPO status , Patient's Chart, lab work & pertinent test results  History of Anesthesia Complications (+) Emergence Delirium  Airway Mallampati: I  TM Distance: >3 FB Neck ROM: Full    Dental no notable dental hx. (+) Teeth Intact, Dental Advisory Given   Pulmonary asthma , COPD COPD inhaler, former smoker,  Used albuterol and singulair this morning breath sounds clear to auscultation  Pulmonary exam normal       Cardiovascular negative cardio ROS  Rhythm:Regular Rate:Normal     Neuro/Psych  Headaches, Anxiety    GI/Hepatic Neg liver ROS, hiatal hernia, GERD-  Medicated and Controlled,  Endo/Other  negative endocrine ROS  Renal/GU negative Renal ROS  negative genitourinary   Musculoskeletal  (+) Arthritis -, Osteoarthritis,    Abdominal   Peds  Hematology negative hematology ROS (+) anemia ,   Anesthesia Other Findings   Reproductive/Obstetrics negative OB ROS                           Anesthesia Physical Anesthesia Plan  ASA: II  Anesthesia Plan: General   Post-op Pain Management:    Induction: Intravenous  Airway Management Planned: Oral ETT  Additional Equipment:   Intra-op Plan:   Post-operative Plan: Extubation in OR  Informed Consent: I have reviewed the patients History and Physical, chart, labs and discussed the procedure including the risks, benefits and alternatives for the proposed anesthesia with the patient or authorized representative who has indicated his/her understanding and acceptance.   Dental advisory given  Plan Discussed with: CRNA  Anesthesia Plan Comments:         Anesthesia Quick Evaluation

## 2014-04-02 NOTE — Brief Op Note (Signed)
04/02/2014  11:38 AM  PATIENT:  April [name]  43 y.o. female  PRE-OPERATIVE DIAGNOSIS:  RIGHT DISTAL TIBIA NONUNION   POST-OPERATIVE DIAGNOSIS:  RIGHT DISTAL TIBIA NONUNION   PROCEDURE:  Procedure(s): 1. OPEN REDUCTION INTERNAL FIXATION (ORIF) RIGHT DISTAL TIBIA FRACTURE NONUNION 2. ILIAC CREST BONE GRAFTING (Right) 3. HARDWARE REMOVAL  (Right)  SURGEON:  Surgeon(s) and Role:    * Rozanna Box, MD - Primary  PHYSICIAN ASSISTANT: Ainsley Spinner, PA-C  ANESTHESIA:   general with supplemental regional block  I/O:  Total I/O In: 2050 [I.V.:2050] Out: 735 [Urine:600; Blood:135]  SPECIMEN:  Source of Specimen:  Nonunion site  DISPOSITION OF SPECIMEN:  To micro  TOURNIQUET:  * No tourniquets in log *  DICTATION: .Other Dictation: Dictation Number [id number]

## 2014-04-02 NOTE — Progress Notes (Signed)
Pt sleeps, no longer crying, cont to rate pain 10/10.

## 2014-04-02 NOTE — Anesthesia Procedure Notes (Addendum)
Anesthesia Regional Block:  Popliteal block  Pre-Anesthetic Checklist: ,, timeout performed, Correct Patient, Correct Site, Correct Laterality, Correct Procedure, Correct Position, site marked, Risks and benefits discussed, pre-op evaluation, post-op pain management  Laterality: Right  Prep: Maximum Sterile Barrier Precautions used and chloraprep       Needles:  Injection technique: Single-shot  Needle Type: Echogenic Stimulator Needle     Needle Length: 9cm 9 cm Needle Gauge: 21 and 21 G    Additional Needles:  Procedures: ultrasound guided (picture in chart) and nerve stimulator Popliteal block  Nerve Stimulator or Paresthesia:  Response: Peroneal,  Response: Tibial,   Additional Responses:   Narrative:  Start time: 04/02/2014 7:20 AM End time: 04/02/2014 7:33 AM Injection made incrementally with aspirations every 5 mL.  Additional Notes: 2% Lidocaine skin wheel. Saphenous block with 10cc of 0.5% Bupivicaine plain.   Date/Time: 04/02/2014 9:01 AM Performed by: Terrill Mohr

## 2014-04-02 NOTE — Progress Notes (Signed)
Orthopedic Tech Progress Note Patient Details:  April [name] 06/19/1970 [id number]  Ortho Devices Ortho Device/Splint Location: applied ohf to bed Ortho Device/Splint Interventions: Ordered, Application   Braulio Bosch 04/02/2014, 8:21 PM

## 2014-04-02 NOTE — Op Note (Signed)
[name], [name]              ACCOUNT NO.:  [id number]  MEDICAL RECORD NO.:  [id number]  LOCATION:  5N08C                        FACILITY:  [facility]  PHYSICIAN:  [name]. Marcelino Scot, M.D. DATE OF BIRTH:  07/31/1970  DATE OF PROCEDURE:  04/02/2014 DATE OF DISCHARGE:                              OPERATIVE REPORT   PREOPERATIVE DIAGNOSIS:  Right distal tibia nonunion.  POSTOPERATIVE DIAGNOSIS:  Right distal tibia nonunion.  PROCEDURES: 1. Repair of right tibial nonunion. 2. Iliac crest bone grafting. 3. Hardware removal of tibial nail with broken screw.  SURGEON:  [name]. Marcelino Scot, M.D.  ASSISTANT:  Jari Pigg, PA-C.  ANESTHESIA:  General.  COMPLICATIONS:  None.  TOURNIQUET:  None.  SPECIMENS:  Two anaerobic, aerobic cultures sent to Micro from the nonunion site.  I/O:  2050 mL crystalloid, out 735 (UOP 600 mL, EBL 135 mL).  DISPOSITION:  To PACU.  CONDITION:  Stable.  BRIEF SUMMARY AND INDICATION FOR PROCEDURE:  April [name] is a 43 year old female who is now 2 years status post IM nailing of a distal metaphyseal tibial fracture, treated with IM nailing and subsequent dynamization.  She did fracture the distal nail, but did not go onto unite.  She has been followed in the office for several months, during which time, she quit smoking in preparation for surgery.  I discussed with her the risks and benefits of surgery including the possibility of infection, nerve injury, vessel injury, DVT, PE, heart attack, stroke, loss of motion, arthritis, failure to achieve union and therefore, a persistent nonunion, symptomatic hardware, pain from the iliac crest bone grafting site, the later which we discussed in detail on several occasions.  After full discussion, she did wish to proceed.  BRIEF SUMMARY OF PROCEDURE:  [name] was taken to the operating room where general anesthesia was induced.  She did receive preoperative antibiotics.  Her right lower extremity was  prepped and draped in usual sterile fashion.  Again, no tourniquet was used during the procedure. She did receive a supplemental block preoperatively.  The procedure began by making part of the anterior knee incision, through which, curette was advanced in the center of the nail, curette was used to remove the nail.  I advanced the extraction bolt into the center and sequentially was able to engage this.  I then took C-arm distally where the lock screw that was broken was identified and then a drill bit introduced through the anterior cortex into the nail contacting the broken locking bolts.  I then in sequential fashion tapped that sufficiently out of the nail that we could again begin the attempt to extract it.  It was quite difficult, but ultimately successful.  Having not encounter any purulence around the nail, the canal was then sequentially reamed from 8.5-9.5 mm.  I did not wish to go beyond that given her small isthmus and to desire not to completely eradicate her intraosseous and intramedullary blood supply.  It should be noted that I did first prior to reaming the intramedullary canal make the medial incision, through which, a 9-hole Biomet plate was eventually inserted. I used a 10-blade and then a series of curettes and elevators to expose  the nonunion site, which was completely mobile and unhealed.  I was very careful to protect the neurovascular bundle with the help of my assistant and proceeded in a stepwise fashion until the entirety of the nonunion site was cleaned free of fibrinous debris.  I did send some of this for both anaerobic and aerobic culture.  It was at that point, then I passed the reamer so that could evacuate the fibrinous material.  We did not have lot of excellent cancellous graft from within the canal, which was not unanticipated.  Consequently at that time, I then turned attention to the iliac crest.  Here, a 3.5-cm incision was made, carrying  dissection down to the muscular plane to the edge of the iliac crest.  Bovie was used to clear the trapdoor area and then an osteotome was used to create the trapdoor and exposed the marrow between the tables.  Using a [employer], I was able to obtain an excellent quantity of graft that appeared very healthy and this was collected in a basin.  I then took that distally to the nonunion site and packed graft, laterally, posteriorly and anteriorly and then added additional cancellous allograft to further support this autograph.  Once the autograft had been placed in the keep positions and then placed the two remaining best autograph sections medially beneath the plate between the cortical bone ends.  Again, additional allograft was packed around this. I was careful to control for alignment and try to correct some of the recurvatum, but was unable to do so entirely and did except little bit of translation of the plate to the posterior side proximally, but easily achieved bicortical fixation.  Distally, I had one standard cortical screw and four locked in the metaphysis.  Proximally, I used two bicortical screws and then two slotted bicortical screws with no lockers.  Thin excellent purchase with all screws.  Wound was irrigated thoroughly and closed in standard layered fashion using 2-0 Vicryl and 3- 0 nylon.  Sterile gently compressive dressing was applied and a posterior stirrup splint.  The patient was awakened from anesthesia and transported to the PACU in stable condition.  The proximal wound was closed in similar fashion with #1 Vicryl for the trapdoor, 0, 2-0 and 3- 0 nylon and 0 and 2-0 and 3-0 nylon for the knee.  Ainsley Spinner, assisted me throughout and was absolutely necessary for the safe and expedient completion of the case.  He was required to facilitate exposure of the nonunion site and allow for grafting and maintenance of reduction during provisional and then definitive  fixation.  Similarly, he was required to extract the nail while I stabilized the extremity during a hardware removal and then he retracted the iliac crest site as well as performed the closure so that I could continue with direct grafting distally.  I am also assisted with wound closure.  PROGNOSIS:  Ms. Wake evidently has excellent chance to going on to union with biological interventions including cessation of smoking and the use of healthy iliac crest graft.  She also has better biomechanical control of this fracture, and we anticipate progression to weightbearing in 6 weeks.  She will be on DVT prophylaxis with Lovenox until discharge from the hospital.  We will continue to follow up her cultures.    [name]. Marcelino Scot, M.D.    MHH/MEDQ  D:  04/02/2014  T:  04/02/2014  Job:  [id number]

## 2014-04-03 ENCOUNTER — Encounter (HOSPITAL_COMMUNITY): Payer: Self-pay | Admitting: Orthopedic Surgery

## 2014-04-03 DIAGNOSIS — F909 Attention-deficit hyperactivity disorder, unspecified type: Secondary | ICD-10-CM | POA: Diagnosis present

## 2014-04-03 DIAGNOSIS — X58XXXD Exposure to other specified factors, subsequent encounter: Secondary | ICD-10-CM | POA: Diagnosis present

## 2014-04-03 DIAGNOSIS — Z87891 Personal history of nicotine dependence: Secondary | ICD-10-CM | POA: Diagnosis not present

## 2014-04-03 DIAGNOSIS — D62 Acute posthemorrhagic anemia: Secondary | ICD-10-CM | POA: Diagnosis not present

## 2014-04-03 DIAGNOSIS — J449 Chronic obstructive pulmonary disease, unspecified: Secondary | ICD-10-CM | POA: Diagnosis present

## 2014-04-03 DIAGNOSIS — F419 Anxiety disorder, unspecified: Secondary | ICD-10-CM | POA: Diagnosis present

## 2014-04-03 DIAGNOSIS — K219 Gastro-esophageal reflux disease without esophagitis: Secondary | ICD-10-CM | POA: Diagnosis present

## 2014-04-03 DIAGNOSIS — S82301K Unspecified fracture of lower end of right tibia, subsequent encounter for closed fracture with nonunion: Secondary | ICD-10-CM | POA: Diagnosis present

## 2014-04-03 DIAGNOSIS — J45909 Unspecified asthma, uncomplicated: Secondary | ICD-10-CM | POA: Diagnosis present

## 2014-04-03 LAB — CBC
HEMATOCRIT: 32.2 % — AB (ref 36.0–46.0)
Hemoglobin: 11.3 g/dL — ABNORMAL LOW (ref 12.0–15.0)
MCH: 32 pg (ref 26.0–34.0)
MCHC: 35.1 g/dL (ref 30.0–36.0)
MCV: 91.2 fL (ref 78.0–100.0)
PLATELETS: 300 10*3/uL (ref 150–400)
RBC: 3.53 MIL/uL — ABNORMAL LOW (ref 3.87–5.11)
RDW: 12.8 % (ref 11.5–15.5)
WBC: 13 10*3/uL — ABNORMAL HIGH (ref 4.0–10.5)

## 2014-04-03 LAB — BASIC METABOLIC PANEL
Anion gap: 11 (ref 5–15)
BUN: 5 mg/dL — AB (ref 6–23)
CO2: 22 mEq/L (ref 19–32)
CREATININE: 0.57 mg/dL (ref 0.50–1.10)
Calcium: 8.2 mg/dL — ABNORMAL LOW (ref 8.4–10.5)
Chloride: 107 mEq/L (ref 96–112)
Glucose, Bld: 114 mg/dL — ABNORMAL HIGH (ref 70–99)
Potassium: 4 mEq/L (ref 3.7–5.3)
Sodium: 140 mEq/L (ref 137–147)

## 2014-04-03 MED ORDER — OXYCODONE HCL 5 MG PO TABS
5.0000 mg | ORAL_TABLET | ORAL | Status: DC | PRN
  Administered 2014-04-03 (×3): 15 mg via ORAL
  Administered 2014-04-03: 5 mg via ORAL
  Administered 2014-04-03: 10 mg via ORAL
  Administered 2014-04-04 – 2014-04-05 (×10): 15 mg via ORAL
  Filled 2014-04-03: qty 1
  Filled 2014-04-03 (×2): qty 3
  Filled 2014-04-03: qty 2
  Filled 2014-04-03 (×12): qty 3

## 2014-04-03 MED ORDER — METHOCARBAMOL 1000 MG/10ML IJ SOLN
1000.0000 mg | Freq: Four times a day (QID) | INTRAMUSCULAR | Status: DC
  Filled 2014-04-03 (×12): qty 10

## 2014-04-03 MED ORDER — DEXTROSE 5 % IV SOLN: 1000.0000 mg | Freq: Four times a day (QID) | INTRAVENOUS | Status: DC

## 2014-04-03 MED ORDER — METHOCARBAMOL 500 MG PO TABS
1000.0000 mg | ORAL_TABLET | Freq: Four times a day (QID) | ORAL | Status: DC
  Administered 2014-04-03 – 2014-04-05 (×9): 1000 mg via ORAL
  Filled 2014-04-03 (×15): qty 2

## 2014-04-03 MED ORDER — MORPHINE SULFATE 2 MG/ML IJ SOLN
1.0000 mg | INTRAMUSCULAR | Status: DC | PRN
  Administered 2014-04-03 – 2014-04-05 (×2): 2 mg via INTRAVENOUS
  Filled 2014-04-03 (×2): qty 1

## 2014-04-03 MED ORDER — GABAPENTIN 300 MG PO CAPS
300.0000 mg | ORAL_CAPSULE | Freq: Two times a day (BID) | ORAL | Status: DC
  Administered 2014-04-03 – 2014-04-05 (×5): 300 mg via ORAL
  Filled 2014-04-03 (×6): qty 1

## 2014-04-03 MED ORDER — METHOCARBAMOL 500 MG PO TABS
1000.0000 mg | ORAL_TABLET | Freq: Four times a day (QID) | ORAL | Status: DC
Start: 2014-04-03 — End: 2014-04-03

## 2014-04-03 NOTE — Anesthesia Postprocedure Evaluation (Signed)
Anesthesia Post Note  Patient: April [name]  Procedure(s) Performed: Procedure(s) (LRB): OPEN REDUCTION INTERNAL FIXATION (ORIF) RIGHT DISTAL TIBIA FRACTURE (Right) AND HARDWARE REMOVAL  (Right)  AND ILIAC BONE GRAFT (Right)  Anesthesia type: General  Patient location: PACU  Post pain: Pain level controlled and Adequate analgesia  Post assessment: Post-op Vital signs reviewed, Patient's Cardiovascular Status Stable, Respiratory Function Stable, Patent Airway and Pain level controlled  Last Vitals:  Filed Vitals:   04/03/14 0558  BP:   Pulse:   Temp:   Resp: 14    Post vital signs: Reviewed and stable  Level of consciousness: awake, alert  and oriented  Complications: No apparent anesthesia complications

## 2014-04-03 NOTE — Plan of Care (Signed)
Problem: Consults Goal: TIB FIB Fracture Patient Education See Patient Education Module for education specifics.  Outcome: Completed/Met Date Met:  04/03/14 Goal: Skin Care Protocol Initiated - if Braden Score 18 or less If consults are not indicated, leave blank or document N/A  Outcome: Completed/Met Date Met:  04/03/14 Goal: Nutrition Consult-if indicated Outcome: Completed/Met Date Met:  04/03/14 Goal: Diabetes Guidelines if Diabetic/Glucose > 140 If diabetic or lab glucose is > 140 mg/dl - Initiate Diabetes/Hyperglycemia Guidelines & Document Interventions  Outcome: Completed/Met Date Met:  04/03/14  Problem: Phase I Progression Outcomes Goal: Clear liquids, advance diet as tolerated Outcome: Completed/Met Date Met:  04/03/14

## 2014-04-03 NOTE — Progress Notes (Signed)
OT Cancellation Note  Patient Details Name: April [name] MRN: [id number] DOB: 1970-12-14   Cancelled Treatment:    Reason Eval/Treat Not Completed: Patient declined, no reason specified Pt declined OT at this time stating "It's been a really hard day." OT will follow up as available to complete evaluation.   Villa Herb M   Cyndie Chime, OTR/L Occupational Therapist [phone] (pager)  04/03/2014, 5:26 PM

## 2014-04-03 NOTE — Progress Notes (Signed)
Pt still c/o pain after oxycodone 15 mg, Percocet 2, robaxin, and neurontin and valium this AM. Pt is averaging 8 mg Morphine per PCA every 4 hours. Pt repts that her ace splint of her RLE is "too" tight. Pt is able to move toes, positive cap refill, positive movement, toes pink and warm. Pt has RLE elevated on 4-5 pillows with ice. Rept to [employer] PA. Per his order, [facility] ortho tech in to rewrap ace cast splint. After rewrapping splint, pt states pain is "noticeably better". Will continue to monitor.

## 2014-04-03 NOTE — Anesthesia Postprocedure Evaluation (Deleted)
Anesthesia Post Note  Patient: April [name]  Procedure(s) Performed: Procedure(s) (LRB): OPEN REDUCTION INTERNAL FIXATION (ORIF) RIGHT DISTAL TIBIA FRACTURE (Right) AND HARDWARE REMOVAL  (Right)  AND ILIAC BONE GRAFT (Right)  Anesthesia type: General  Patient location: PACU  Post pain: Pain level controlled and Adequate analgesia  Post assessment: Post-op Vital signs reviewed, Patient's Cardiovascular Status Stable, Respiratory Function Stable, Patent Airway and Pain level controlled  Last Vitals:  Filed Vitals:   04/03/14 0558  BP:   Pulse:   Temp:   Resp: 14    Post vital signs: Reviewed and stable  Level of consciousness: awake, alert  and oriented  Complications: No apparent anesthesia complications

## 2014-04-03 NOTE — Evaluation (Signed)
Physical Therapy Evaluation Patient Details Name: April [name] MRN: [id number] DOB: 02-13-1971 Today's Date: 04/03/2014   History of Present Illness  43 y/o white female sustained R distal tibia fracture approximately 2 years ago. Had multiple surgeries on R leg. Has persistent nonunion R distal tibia fracture. Has had revision IMN performed as well w/o success. Pt has long history of smoking but has recently quit and this has been confirmed on PAT labs.  Pt now s/p ORIF R distal tibial nonunion with ICBG autografting  Past Medical History  Diagnosis Date  . COPD (chronic obstructive pulmonary disease)   . Asthma   . Pneumonia   . Anxiety   . GERD (gastroesophageal reflux disease)     years ago  . History of hiatal hernia     years ago  . Headache     migraines   . Neuromuscular disorder     nerve damage in right leg  . Arthritis   . Cancer     uterine cancer, hysterectomy - ?2005  . Anemia     years ago  . Irritable bowel syndrome   . Complication of anesthesia     woke up with an anxiety attack with last surgery (2013)  . ADHD (attention deficit hyperactivity disorder)   . Family history of adverse reaction to anesthesia     " mother had difficulty waking "   Past Surgical History  Procedure Laterality Date  . Tonsillectomy    . Tubal ligation    . Vaginal hysterectomy    . Bunionectomy Bilateral   . Esophagogastroduodenoscopy    . Fracture surgery      right tibia  . Dilation and curettage of uterus    . Wisdom tooth extraction    . Colonoscopy    . Orif tibia fracture Right 04/02/2014    Procedure: OPEN REDUCTION INTERNAL FIXATION (ORIF) RIGHT DISTAL TIBIA FRACTURE;  Surgeon: Rozanna Box, MD;  Location: [facility];  Service: Orthopedics;  Laterality: Right;  . Hardware removal Right 04/02/2014    Procedure: AND HARDWARE REMOVAL ;  Surgeon: Rozanna Box, MD;  Location: [facility];  Service: Orthopedics;  Laterality: Right;  . Harvest bone graft Right 04/02/2014     Procedure:  AND ILIAC BONE GRAFT;  Surgeon: Rozanna Box, MD;  Location: [facility];  Service: Orthopedics;  Laterality: Right;     Clinical Impression   Patient is s/p above surgery resulting in functional limitations due to the deficits listed below (see PT Problem List).  Patient will benefit from skilled PT to increase their independence and safety with mobility to allow discharge to the venue listed below.   Returned when pt more agreeable to getting up and working on walking, and pt participated better than anticipated; She requests a wheelchair, which is reasonable for longer duration community access.       Follow Up Recommendations Outpatient PT  The potential need for Outpatient PT can be addressed at Ortho follow-up appointments.     Equipment Recommendations  Wheelchair (measurements PT);Other (comment) (with elevating legrest)    Recommendations for Other Services       Precautions / Restrictions Precautions Precautions: None Restrictions RLE Weight Bearing: Non weight bearing      Mobility  Bed Mobility Overal bed mobility: Modified Independent                Transfers Overall transfer level: Needs assistance Equipment used: Rolling walker (2 wheeled) Transfers: Sit to/from Stand Sit to Stand: Supervision  General transfer comment: Cues for hand placement and technique; tends to push up on RW  Ambulation/Gait Ambulation/Gait assistance: Supervision Ambulation Distance (Feet): 100 Feet (with one seated rest break) Assistive device: Rolling walker (2 wheeled)       General Gait Details: Keeping NWBing well; RW height adjusted for optimal fit, and pt educated in measuring for fit on her own RW at home; Fatigued at end of amb  Stairs Stairs:  (Able to correctly verbalize technique with crutches and RW)          Wheelchair Mobility    Modified Rankin (Stroke Patients Only)       Balance                                              Pertinent Vitals/Pain Pain Assessment: 0-10 Pain Score:  ("40 out of 10") Pain Location: RLE, lower leg and hip Pain Descriptors / Indicators: Aching Pain Intervention(s): Monitored during session;Premedicated before session;PCA encouraged    Home Living Family/patient expects to be discharged to:: Private residence Living Arrangements: Alone Available Help at Discharge: Family;Available PRN/intermittently Type of Home: House Home Access: Stairs to enter Entrance Stairs-Rails: None Entrance Stairs-Number of Steps: 1 (Pt staes she will have a ramp in place by time of dc) Home Layout: One level Home Equipment: Walker - 2 wheels;Crutches      Prior Function Level of Independence: Independent with assistive device(s)         Comments: Used Rw and/or crutches in the past, depending on the circumstances     Hand Dominance        Extremity/Trunk Assessment   Upper Extremity Assessment: Overall WFL for tasks assessed           Lower Extremity Assessment: RLE deficits/detail RLE Deficits / Details: Decr AROM and strength, limited by pain postop; quad activation present, and able to perform a straight leg raise for bed mobility and maneuvering       Communication   Communication: No difficulties  Cognition Arousal/Alertness: Awake/alert Behavior During Therapy: WFL for tasks assessed/performed Overall Cognitive Status: Within Functional Limits for tasks assessed                      General Comments      Exercises Total Joint Exercises Quad Sets: AROM;Right;5 reps      Assessment/Plan    PT Assessment Patient needs continued PT services  PT Diagnosis Difficulty walking;Acute pain   PT Problem List Decreased strength;Decreased range of motion;Decreased activity tolerance;Decreased mobility;Decreased knowledge of use of DME;Decreased knowledge of precautions;Pain  PT Treatment Interventions DME instruction;Gait training;Stair  training;Functional mobility training;Therapeutic activities;Therapeutic exercise;Patient/family education;Wheelchair mobility training   PT Goals (Current goals can be found in the Care Plan section) Acute Rehab PT Goals Patient Stated Goal: Home soon PT Goal Formulation: With patient Time For Goal Achievement: 04/10/14 Potential to Achieve Goals: Good    Frequency Min 5X/week   Barriers to discharge        Co-evaluation               End of Session   Activity Tolerance: Patient tolerated treatment well Patient left: with call bell/phone within reach;with family/visitor present (in bathroom on toilet) Nurse Communication: Mobility status         Time: 1340-1413 PT Time Calculation (min) (ACUTE ONLY): 33 min   Charges:  PT Evaluation $Initial PT Evaluation Tier I: 1 Procedure PT Treatments $Gait Training: 23-37 mins   PT G Codes:          Roney Marion Hamff 04/03/2014, 4:29 PM  Roney Marion, [place] Pager [phone] Office [phone]

## 2014-04-03 NOTE — Progress Notes (Signed)
UR completed 

## 2014-04-03 NOTE — Plan of Care (Signed)
Problem: Phase I Progression Outcomes Goal: Post op CMS Neurovascular WDL Outcome: Completed/Met Date Met:  04/03/14 Goal: Voiding-avoid urinary catheter unless indicated Outcome: Completed/Met Date Met:  04/03/14 Goal: Hemodynamically stable Outcome: Completed/Met Date Met:  04/03/14 Goal: Other Phase I Outcomes/Goals Outcome: Completed/Met Date Met:  04/03/14  Problem: Phase II Progression Outcomes Goal: Bed to chair transfers BID Outcome: Adequate for Discharge Goal: Post op CMS Neurovascular WDL Outcome: Completed/Met Date Met:  04/03/14 Goal: Tolerating diet Outcome: Completed/Met Date Met:  04/03/14 Goal: Other Phase II Outcomes/Goals Outcome: Completed/Met Date Met:  04/03/14  Problem: Phase III Progression Outcomes Goal: Dressing/splint clean, dry, intact Outcome: Completed/Met Date Met:  04/03/14 Goal: Maintains weight - bearing per MD orders Outcome: Completed/Met Date Met:  04/03/14 Goal: Other Phase III Outcomes/Goals Outcome: Completed/Met Date Met:  04/03/14  Problem: Discharge Progression Outcomes Goal: Barriers To Progression Addressed/Resolved Outcome: Adequate for Discharge Goal: Hemodynamically stable Outcome: Completed/Met Date Met:  04/03/14 Goal: CMS at or above baseline Outcome: Completed/Met Date Met:  04/03/14 Goal: Dressing/splint clean, dry, intact, no S/S of infection Outcome: Completed/Met Date Met:  04/03/14 Goal: Tolerates diet Outcome: Completed/Met Date Met:  04/03/14

## 2014-04-03 NOTE — Progress Notes (Signed)
Orthopaedic Trauma Service Progress Note  Subjective  Block starting to wear off, as is local around R hip  Has been up already  Foley has been dc'd and pt has voided Notes pretty substantial neuropathic type pain- feels like leg is on fire  Review of Systems  Constitutional: Negative for fever and chills.  Respiratory: Negative for shortness of breath and wheezing.   Cardiovascular: Negative for chest pain and palpitations.  Gastrointestinal: Negative for nausea, vomiting and abdominal pain.  Genitourinary: Negative for dysuria.  Neurological:       Baseline sensory changes to  R foot stable      Objective   BP 99/52 mmHg  Pulse 81  Temp(Src) 98.8 F (37.1 C) (Oral)  Resp 14  SpO2 100%  Intake/Output      11/10 0701 - 11/11 0700 11/11 0701 - 11/12 0700   P.O. 860    I.V. 3325    IV Piggyback 200    Total Intake 4385     Urine 5550    Blood 135    Total Output 5685     Net -1300          Urine Occurrence  1 x     Labs  Results for April, [name] (MRN [id number]) as of 04/03/2014 08:41  Ref. Range 04/03/2014 01:05  Sodium Latest Range: 137-147 mEq/L 140  Potassium Latest Range: 3.7-5.3 mEq/L 4.0  Chloride Latest Range: 96-112 mEq/L 107  CO2 Latest Range: 19-32 mEq/L 22  BUN Latest Range: 6-23 mg/dL 5 (L)  Creatinine Latest Range: 0.50-1.10 mg/dL 0.57  Calcium Latest Range: 8.4-10.5 mg/dL 8.2 (L)  GFR calc non Af Amer Latest Range: >90 mL/min >90  GFR calc Af Amer Latest Range: >90 mL/min >90  Glucose Latest Range: 70-99 mg/dL 114 (H)  Anion gap Latest Range: 5-15  11  WBC Latest Range: 4.0-10.5 K/uL 13.0 (H)  RBC Latest Range: 3.87-5.11 MIL/uL 3.53 (L)  Hemoglobin Latest Range: 12.0-15.0 g/dL 11.3 (L)  HCT Latest Range: 36.0-46.0 % 32.2 (L)  MCV Latest Range: 78.0-100.0 fL 91.2  MCH Latest Range: 26.0-34.0 pg 32.0  MCHC Latest Range: 30.0-36.0 g/dL 35.1  RDW Latest Range: 11.5-15.5 % 12.8  Platelets Latest Range: 150-400 K/uL 300    Exam  Gen:  awake and alert, in bed, trying to eat breakfast, looks uncomfortable at times Lungs: CTA B  Cardiac: RRR, S1 and S2 Abd: soft, NTND, +BS Ext:       Right Lower Extremity   Dressing and splint c/d/i  Ext warm  Pt can flex and extend toes  Dec sensation along DPN and SPN (baseline)  Some hyperesthesia along SPN  TN sensation intact  No significant swelling    Dressing R hip stable. C/D/I   Assessment and Plan   POD/HD#: 1   43 y/o female s/p repair R tibial shaft nonunion   1. R tibial shaft nonunion   NWB   Ice and elevate  Continue with splint  Knee ROM as tolerated  PT/OT consults  Ok to leave floor   2. R ICBG harvest  Dressing changes starting tomorrow as needed  Ice prn    3. Pain management:  Continue with pca  Add neurontin  Schedule robaxin   4. ABL anemia/Hemodynamics  Stable  Cbc in am  5. Medical issues   Continue with home meds  6. DVT/PE prophylaxis:  lovenox   Will dc on 3 weeks of lovenox   7. ID:   Completed periop abx  8. Metabolic Bone Disease:  Labs look good  No additional nutritional supplementation needed   9. Activity:  NWB R leg  OOB as tolerated   10. FEN/Foley/Lines:  Diet as tolerated  Foley has been dc'd   11. Dispo:  Continue with inpatient care  Probable dc home Friday     Jari Pigg, PA-C [facility] [phone] (O) 04/03/2014 8:33 AM

## 2014-04-03 NOTE — Progress Notes (Signed)
PT Cancellation Note  Patient Details Name: April [name] MRN: [id number] DOB: [birth year]   Cancelled Treatment:    Reason Eval/Treat Not Completed: Other (comment)   Politely declined PT/mobility/OOB, despite my best effort at encouragement;  States she will work with PT tomorrow;   Took time to validate pt's frustration with the situation, and gain some rapport, with goal of increasing pt's participation in therapy.  Roney Marion, [place]  Acute Rehabilitation Services Pager [phone] Office [phone]     Roney Marion [facility] 04/03/2014, 1:41 PM

## 2014-04-03 NOTE — Plan of Care (Signed)
Problem: Phase II Progression Outcomes Goal: Bed to chair transfers BID Outcome: Completed/Met Date Met:  04/03/14

## 2014-04-03 NOTE — Progress Notes (Signed)
INITIAL NUTRITION ASSESSMENT  DOCUMENTATION CODES Per approved criteria  -Not Applicable   INTERVENTION: Provide nourishment snack in between meals (fruit cups). Ordered.  Encourage PO intake.  NUTRITION DIAGNOSIS: Increased nutrient needs related to s/p procedure as evidenced by estimated nutrition needs.   Goal: Pt to meet >/= 90% of their estimated nutrition needs   Monitor:  PO intake, weight trends, labs, I/O's  Reason for Assessment: MST  43 y.o. female  Admitting Dx: Tibia Fracture  ASSESSMENT: Pt sustained R distal tibia fracture approximately 2 years ago. Had multiple surgeries on R leg. Has persistent nonunion R distal tibia fracture. Has had revision IMN performed as well w/o success. Pt with PMH of COPD, asthma, GERD, IBS, and arthritis.   PROCEDURES (11/10): 1. Repair of right tibial nonunion. 2. Iliac crest bone grafting. 3. Hardware removal of tibial nail with broken screw.  Pt reports a lack of appetite currently, but says she was fine yesterday eating well. Meal completion is 75%. Daughter reports pt has been fatigued since yesterday, which may contribute to lack of appetite. Pt reports her weight has been stable with usual body weight of 126 lbs. Pt declined oral supplements, but reports she would like snacks ordered between meals (fruit cup). Will order. Pt was encouraged to eat her food at meals.   Unable to perform nutrition focused physical exam at this time as pt was agitated during visit. Will perform during next visit.  Labs: Low BUN and GFR.  Height: Ht Readings from Last 1 Encounters:  03/26/14 5\' 7"  (1.702 m)    Weight: Wt Readings from Last 1 Encounters:  03/26/14 126 lb 14.4 oz (57.561 kg)    Ideal Body Weight: 135 lbs  % Ideal Body Weight: 93%  Wt Readings from Last 10 Encounters:  03/26/14 126 lb 14.4 oz (57.561 kg)    Usual Body Weight: 125 lbs  % Usual Body Weight: 101%  BMI:  Body Mass Index: 19.89 kg/(m^2)  Estimated  Nutritional Needs: Kcal: 1700-1900 Protein: 70-85 grams  Fluid: 1.7 - 1.9 L/day  Skin: incision on right hip and leg  Diet Order: Diet regular  EDUCATION NEEDS: -No education needs identified at this time   Intake/Output Summary (Last 24 hours) at 04/03/14 1421 Last data filed at 04/03/14 1327  Gross per 24 hour  Intake   2415 ml  Output   4500 ml  Net  -2085 ml    Last BM: 11/9  Labs:   Recent Labs Lab 04/02/14 1550 04/03/14 0105  NA  --  140  K  --  4.0  CL  --  107  CO2  --  22  BUN  --  5*  CREATININE 0.53 0.57  CALCIUM  --  8.2*  GLUCOSE  --  114*    CBG (last 3)  No results for input(s): GLUCAP in the last 72 hours.  Scheduled Meds: . docusate sodium  100 mg Oral BID  . enoxaparin (LOVENOX) injection  40 mg Subcutaneous Q24H  . gabapentin  300 mg Oral BID  . methocarbamol  1,000 mg Oral QID   Or  . methocarbamol (ROBAXIN)  IV  1,000 mg Intravenous QID  . montelukast  10 mg Oral QHS  . morphine   Intravenous 6 times per day  . polyethylene glycol  17 g Oral Daily    Continuous Infusions: . 0.9 % NaCl with KCl 20 mEq / L 50 mL/hr at 04/03/14 0830    Past Medical History  Diagnosis Date  .  COPD (chronic obstructive pulmonary disease)   . Asthma   . Pneumonia   . Anxiety   . GERD (gastroesophageal reflux disease)     years ago  . History of hiatal hernia     years ago  . Headache     migraines   . Neuromuscular disorder     nerve damage in right leg  . Arthritis   . Cancer     uterine cancer, hysterectomy - ?2005  . Anemia     years ago  . Irritable bowel syndrome   . Complication of anesthesia     woke up with an anxiety attack with last surgery (2013)  . ADHD (attention deficit hyperactivity disorder)   . Family history of adverse reaction to anesthesia     " mother had difficulty waking "    Past Surgical History  Procedure Laterality Date  . Tonsillectomy    . Tubal ligation    . Vaginal hysterectomy    . Bunionectomy  Bilateral   . Esophagogastroduodenoscopy    . Fracture surgery      right tibia  . Dilation and curettage of uterus    . Wisdom tooth extraction    . Colonoscopy    . Orif tibia fracture Right 04/02/2014    Procedure: OPEN REDUCTION INTERNAL FIXATION (ORIF) RIGHT DISTAL TIBIA FRACTURE;  Surgeon: Rozanna Box, MD;  Location: [facility];  Service: Orthopedics;  Laterality: Right;  . Hardware removal Right 04/02/2014    Procedure: AND HARDWARE REMOVAL ;  Surgeon: Rozanna Box, MD;  Location: [facility];  Service: Orthopedics;  Laterality: Right;  . Harvest bone graft Right 04/02/2014    Procedure:  AND ILIAC BONE GRAFT;  Surgeon: Rozanna Box, MD;  Location: [place];  Service: Orthopedics;  Laterality: Right;    Kallie Locks, MS, RD, LDN Pager # [phone] After hours/ weekend pager # [phone]

## 2014-04-04 LAB — CBC
HCT: 34.2 % — ABNORMAL LOW (ref 36.0–46.0)
Hemoglobin: 11.7 g/dL — ABNORMAL LOW (ref 12.0–15.0)
MCH: 32.2 pg (ref 26.0–34.0)
MCHC: 34.2 g/dL (ref 30.0–36.0)
MCV: 94.2 fL (ref 78.0–100.0)
PLATELETS: 258 10*3/uL (ref 150–400)
RBC: 3.63 MIL/uL — ABNORMAL LOW (ref 3.87–5.11)
RDW: 13.1 % (ref 11.5–15.5)
WBC: 9.9 10*3/uL (ref 4.0–10.5)

## 2014-04-04 NOTE — Progress Notes (Signed)
Physical Therapy Treatment Patient Details Name: April [name] MRN: [id number] DOB: 1970-11-13 Today's Date: 04/04/2014    History of Present Illness 43 y/o white female sustained R distal tibia fracture approximately 2 years ago. Had multiple surgeries on R leg. Has persistent nonunion R distal tibia fracture. Has had revision IMN performed as well w/o success. Pt has long history of smoking but has recently quit and this has been confirmed on PAT labs.  Pt now s/p ORIF R distal tibial nonunion with ICBG autografting    PT Comments    Pt. Mobilizing well.  She asked if she could have an aide to come into her home several times a week to help her bathe.   Pt. And I discussed with Ricki Miller, CM who confirms that due to pt having Medicaid, no in home services available.  Pt's 41 year old will be assisting pt. In the home environment. Will plan to see in the am  And pt. Anticipates Dc home tomorrow.    Follow Up Recommendations  Outpatient PT (when surgeon feels appropriate)     Equipment Recommendations  Wheelchair (measurements PT);Wheelchair cushion (measurements PT);Other (comment) (with elevating leg rests)    Recommendations for Other Services       Precautions / Restrictions Precautions Precautions: None Restrictions Weight Bearing Restrictions: Yes RLE Weight Bearing: Non weight bearing    Mobility  Bed Mobility Overal bed mobility: Modified Independent                Transfers Overall transfer level: Modified independent Equipment used: Rolling walker (2 wheeled) Transfers: Sit to/from Stand Sit to Stand: Modified independent (Device/Increase time)         General transfer comment: good hand placement and technique today  Ambulation/Gait Ambulation/Gait assistance: Supervision Ambulation Distance (Feet): 100 Feet Assistive device: Rolling walker (2 wheeled) Gait Pattern/deviations: Step-to pattern (single leg hop) Gait velocity: decreased   General  Gait Details: no problems with NWB; no overt LOB; appears steady with RW   Stairs            Wheelchair Mobility    Modified Rankin (Stroke Patients Only)       Balance                                    Cognition Arousal/Alertness: Awake/alert Behavior During Therapy: WFL for tasks assessed/performed Overall Cognitive Status: Within Functional Limits for tasks assessed                      Exercises      General Comments        Pertinent Vitals/Pain Pain Assessment: 0-10 Pain Score: 8  Pain Location: R hip and lower leg Pain Descriptors / Indicators: Aching Pain Intervention(s): RN gave pain meds during session;Repositioned;Monitored during session    Home Living Family/patient expects to be discharged to:: Private residence Living Arrangements: Alone Available Help at Discharge: Family;Available PRN/intermittently Type of Home: House Home Access: Stairs to enter Entrance Stairs-Rails: None Home Layout: One level Home Equipment: Environmental consultant - 2 wheels;Crutches      Prior Function Level of Independence: Independent with assistive device(s)      Comments: Used Rw and/or crutches in the past, depending on the circumstances   PT Goals (current goals can now be found in the care plan section) Acute Rehab PT Goals Patient Stated Goal: home maybe today or tomorrow Progress towards PT goals: Progressing  toward goals    Frequency  Min 5X/week    PT Plan Current plan remains appropriate    Co-evaluation             End of Session Equipment Utilized During Treatment: Gait belt Activity Tolerance: Patient tolerated treatment well Patient left: in bed;with call bell/phone within reach     Time: 1519-1550 PT Time Calculation (min) (ACUTE ONLY): 31 min  Charges:  $Gait Training: 23-37 mins                    G Codes:      Ladona Ridgel 04/04/2014, 4:07 PM Gerlean Ren PT Acute Rehab Services [phone] [place]  [phone]

## 2014-04-04 NOTE — Progress Notes (Signed)
Orthopaedic Trauma Service Progress Note  Subjective  Doing much better this am Pain improved Tolerating diet  Eager to work with therapy   Review of Systems  Constitutional: Negative for fever and chills.  Respiratory: Negative for shortness of breath and wheezing.   Cardiovascular: Negative for chest pain and palpitations.  Gastrointestinal: Negative for nausea, vomiting and abdominal pain.  Genitourinary: Negative for dysuria.  Neurological: Negative for headaches.     Objective    BP 119/61 mmHg  Pulse 104  Temp(Src) 99.8 F (37.7 C) (Oral)  Resp 18  SpO2 99%  Intake/Output      11/11 0701 - 11/12 0700 11/12 0701 - 11/13 0700   P.O. 680    I.V. 385    IV Piggyback     Total Intake 1065     Urine 900    Blood     Total Output 900     Net +165          Urine Occurrence 8 x      Labs  Results for CALEA, HRIBAR (MRN [id number]) as of 04/04/2014 08:27  Ref. Range 04/04/2014 00:22  WBC Latest Range: 4.0-10.5 K/uL 9.9  RBC Latest Range: 3.87-5.11 MIL/uL 3.63 (L)  Hemoglobin Latest Range: 12.0-15.0 g/dL 11.7 (L)  HCT Latest Range: 36.0-46.0 % 34.2 (L)  MCV Latest Range: 78.0-100.0 fL 94.2  MCH Latest Range: 26.0-34.0 pg 32.2  MCHC Latest Range: 30.0-36.0 g/dL 34.2  RDW Latest Range: 11.5-15.5 % 13.1  Platelets Latest Range: 150-400 K/uL 258    Exam  Gen: awake and alert, appears comfortable Lungs: clear  Cardiac: RRR Abd: NTND, + BS Ext:       Right Lower Extremity   Dressing and splint c/d/i             Ext warm             Pt can flex and extend toes             Dec sensation along DPN and SPN (baseline)             Some hyperesthesia along SPN             TN sensation intact             No significant swelling                          Dressing R hip stable. C/D/I     Assessment and Plan   POD/HD#: 2   43 y/o female s/p repair R tibial shaft nonunion   1. R tibial shaft nonunion               NWB               Ice and elevate           Continue with splint             Knee ROM as tolerated             PT/OT              Ok to leave floor   2. R ICBG harvest             Dressing changes as needed             Ice prn    3. Pain management:             dc pca  Add neurontin             Schedule robaxin              4. ABL anemia/Hemodynamics             Stable  5. Medical issues               Continue with home meds  6. DVT/PE prophylaxis:             lovenox               Will dc on 3 weeks of lovenox   7. ID:               Completed periop abx   8. Metabolic Bone Disease:             Labs look good             No additional nutritional supplementation needed   9. Activity:             NWB R leg             OOB as tolerated   10. FEN/Foley/Lines:             Diet as tolerated             NSL IV  Dc IVF              11. Dispo:             Continue with inpatient care             Probable dc home tomorrow    Jari Pigg, PA-C [facility] [phone] (O) 04/04/2014 8:25 AM

## 2014-04-04 NOTE — Evaluation (Signed)
Occupational Therapy Evaluation and Discharge Patient Details Name: April [name] MRN: [id number] DOB: April [name] Today's Date: 04/04/2014    History of Present Illness 43 y/o white female sustained R distal tibia fracture approximately 2 years ago. Had multiple surgeries on R leg. Has persistent nonunion R distal tibia fracture. Has had revision IMN performed as well w/o success. Pt has long history of smoking but has recently quit and this has been confirmed on PAT labs.  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 She is at a Mod I level for BADLs and per her report she will prn A at home. No further BADL concerns per pt, we will sign off.    Follow Up Recommendations  No OT follow up    Equipment Recommendations  Tub/shower bench;Wheelchair (measurements OT);Wheelchair cushion (measurements OT) (elevating leg rests)       Precautions / Restrictions Precautions Precautions: None Restrictions Weight Bearing Restrictions: Yes RLE Weight Bearing: Non weight bearing      Mobility Bed Mobility Overal bed mobility: Modified Independent                Transfers Overall transfer level: Modified independent Equipment used: Rolling walker (2 wheeled) Transfers: Sit to/from Stand Sit to Stand: Modified independent (Device/Increase time)                   ADL Overall ADL's : Modified independent                                       General ADL Comments: Pt does not feel that she needs a 3n1, she dressed herself this AM, I demonstrated to her in the gym how to do the tub transfer to tub bench in our rehab gym--she politely declined to practice               Pertinent Vitals/Pain  No c/o pain     Hand Dominance Right   Extremity/Trunk Assessment Upper Extremity Assessment Upper Extremity Assessment: Overall WFL for tasks assessed            Communication Communication Communication: No difficulties   Cognition Arousal/Alertness: Awake/alert Behavior During Therapy: WFL for tasks assessed/performed Overall Cognitive Status: Within Functional Limits for tasks assessed                                Home Living Family/patient expects to be discharged to:: Private residence Living Arrangements: Alone Available Help at Discharge: Family;Available PRN/intermittently Type of Home: House Home Access: Stairs to enter [name] of Steps: 1 Entrance Stairs-Rails: None Home Layout: One level     Bathroom Shower/Tub: Tub/shower unit;Curtain Shower/tub characteristics: Architectural technologist: Standard     Home Equipment: Environmental consultant - 2 wheels;Crutches          Prior Functioning/Environment Level of Independence: Independent with assistive device(s)        Comments: Used Rw and/or crutches in the past, depending on the circumstances    OT Diagnosis: Generalized weakness         OT Goals(Current goals can be found in the care plan section) Acute Rehab OT Goals Patient Stated Goal: home maybe today or tomorrow  OT Frequency:  End of Session Equipment Utilized During Treatment: Surveyor, mining Communication:  (pt requests meds for nausea)  Activity Tolerance: Patient tolerated treatment well Patient left: in bed;with call bell/phone within reach   Time: 4643-1427 OT Time Calculation (min): 26 min Charges:  OT General Charges $OT Visit: 1 Procedure OT Evaluation $Initial OT Evaluation Tier I: 1 Procedure OT Treatments $Self Care/Home Management : 8-22 mins  April [name] [phone] 04/04/2014, 3:15 PM

## 2014-04-04 NOTE — Progress Notes (Signed)
Patient thinks that she may be getting a yeast infection.  She would like to get medication ordered for this.

## 2014-04-04 NOTE — Plan of Care (Signed)
Problem: Phase I Progression Outcomes Goal: Initial discharge plan identified Outcome: Completed/Met Date Met:  04/04/14  Problem: Phase II Progression Outcomes Goal: Discharge plan established Outcome: Completed/Met Date Met:  04/04/14  Problem: Phase III Progression Outcomes Goal: Discharge plan remains appropriate-arrangements made Outcome: Completed/Met Date Met:  04/04/14  Problem: Discharge Progression Outcomes Goal: Discharge plan in place and appropriate Outcome: Completed/Met Date Met:  76/16/07 Goal: Complications resolved/controlled Outcome: Completed/Met Date Met:  04/04/14 Goal: Ambulates safely using assistive device Outcome: Completed/Met Date Met:  04/04/14 Goal: Activity appropriate for discharge plan Outcome: Completed/Met Date Met:  04/04/14

## 2014-04-05 DIAGNOSIS — J45909 Unspecified asthma, uncomplicated: Secondary | ICD-10-CM | POA: Diagnosis present

## 2014-04-05 DIAGNOSIS — K219 Gastro-esophageal reflux disease without esophagitis: Secondary | ICD-10-CM | POA: Diagnosis present

## 2014-04-05 DIAGNOSIS — F419 Anxiety disorder, unspecified: Secondary | ICD-10-CM | POA: Diagnosis present

## 2014-04-05 LAB — TISSUE CULTURE
Culture: NO GROWTH
Gram Stain: NONE SEEN

## 2014-04-05 MED ORDER — METHOCARBAMOL 500 MG PO TABS: 500.0000 mg | ORAL_TABLET | Freq: Four times a day (QID) | ORAL | Status: DC | PRN

## 2014-04-05 MED ORDER — ASPIRIN EC 325 MG PO TBEC: 325.0000 mg | DELAYED_RELEASE_TABLET | Freq: Two times a day (BID) | ORAL | Status: DC

## 2014-04-05 MED ORDER — GABAPENTIN 300 MG PO CAPS: 300.0000 mg | ORAL_CAPSULE | Freq: Two times a day (BID) | ORAL | Status: DC

## 2014-04-05 MED ORDER — OXYCODONE-ACETAMINOPHEN 5-325 MG PO TABS: 1.0000 | ORAL_TABLET | Freq: Four times a day (QID) | ORAL | Status: DC | PRN

## 2014-04-05 MED ORDER — OXYCODONE HCL 5 MG PO TABS: 5.0000 mg | ORAL_TABLET | Freq: Four times a day (QID) | ORAL | Status: DC | PRN

## 2014-04-05 MED ORDER — DSS 100 MG PO CAPS: 100.0000 mg | ORAL_CAPSULE | Freq: Two times a day (BID) | ORAL | Status: DC

## 2014-04-05 NOTE — Progress Notes (Signed)
6:59 AM 04/05/2014  Pt complained of pain at incision site from a right hip bone graft. She was very upset and crying. She stated that no one had been by to assess her hip or change the dressing since she has been on the unit. Pt assessed by both the charge nurse and myself. Assessment notes of redness, tenderness, and warmth to right hip where bone graft was done. PA, Ainsley Spinner paged at 6:40am. MD Office called at 6:50am. On call, PA, Mateo Flow returned my call at 6:58am. She informed me that they were changing shifts, she did not know the patient and to let the pt know that the attending MD will be around this morning and she can express her concerns then. WBC-9.9, Temp. 98.7. Will continue to monitor.

## 2014-04-05 NOTE — Progress Notes (Signed)
[facility] Progress Note  Subjective  Ready to go home Nursing notes reviewed Pt very upset Able to calm pt down C/o pain R hip Voiding w/o difficulty   Review of Systems  Constitutional: Negative for fever and chills.  Respiratory: Negative for shortness of breath and wheezing.   Cardiovascular: Negative for chest pain and palpitations.  Gastrointestinal: Negative for nausea, vomiting and abdominal pain.  Genitourinary: Negative for dysuria and urgency.  Neurological: Negative for headaches.     Objective   BP 117/58 mmHg  Pulse 103  Temp(Src) 99.2 F (37.3 C) (Oral)  Resp 16  SpO2 100%  Intake/Output      11/12 0701 - 11/13 0700 11/13 0701 - 11/14 0700   P.O.     I.V. 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 R tibial shaft nonunion               NWB               Ice and elevate             Continue with splint until follow up              Knee ROM as tolerated             PT/OT               Ok to leave floor   2. R ICBG harvest             Dressing changed today  Daily dressing changes, ok to wash with soap and water              Ice prn    3. Pain management:          PO  Percocet  Oxy IR  Robaxin               4. ABL anemia/Hemodynamics             Stable  5. Medical issues               Continue with home meds  6. DVT/PE prophylaxis:             lovenox      Will dc on ASA 325 po BID x 4 weeks  7. ID:               Completed periop abx   8. Metabolic Bone Disease:             Labs look good             No additional nutritional supplementation needed   9.  Activity:             NWB R leg             OOB as tolerated   10. FEN/Foley/Lines:             Diet as tolerated             dc IV             Dc IVF              11. Dispo:             dc home today   Follow up with ortho in 10-14 days, pt will call for appointment       Jari Pigg, PA-C [facility] [phone] (O) 04/05/2014 11:04 AM

## 2014-04-05 NOTE — Progress Notes (Signed)
Patient stated that she got up to go to the bathroom by herself and then environmental services came in and saw her attempting to take herself to the bathroom. Environmental services states she went into hall way and asked Beth, NT to assist patient to bathroom. Patient and environmental services state that [facility] said "I can't help right now because the is not my patient and I have someone on the bedside commode that needs assistance." Patient is very upset that she was not assisted immediately and notified Ainsley Spinner, PA and myself. We apologized to patient and I instructed patient to use call light to notify nursing staff whenever she needs to go to the bathroom and that she should not get up by herself.

## 2014-04-05 NOTE — Discharge Summary (Signed)
Orthopaedic Trauma Service (OTS)  Patient ID: April [name] MRN: [id number] DOB/AGE: 27-Aug-1970 43 y.o.  Admit date: 04/02/2014 Discharge date: 04/05/2014  Admission Diagnoses: Right distal tibial shaft nonunion Asthma Anxiety GERD  Discharge Diagnoses:  Active Problems:   nonunion tibia   Asthma   Anxiety   GERD (gastroesophageal reflux disease)   Procedures Performed: 04/02/2014- Dr. Marcelino Scot 1. Repair of right tibial nonunion. 2. Iliac crest bone grafting. 3. Hardware removal of tibial nail with broken screw   Discharged Condition: good  Hospital Course:   Patient is a 43 year old female with approximately two-year history of a right tibia fracture. This was treated at another institution. Patient developed a nonunion and also had some neuromuscular dysfunction related to her fracture and presented to our office via another community orthopedist. Multiple treatment options were discussed with the patient. Patient was seen numerous times in the office and finally elected to proceed with surgical intervention of her nonunion. Also we told the patient that we would not proceed with repair of her nonunion until she quit smoking. Patient was able to do this and this was confirmed on preadmission lab testing.  Patient was taken to the operating room on the date noted above she tolerated procedures well. After surgery she was transferred to the orthopedic floor for continued observation and pain control. Her hospital stay was relatively uncomplicated. She did receive a block prior to surgery. This did wear off quite quickly overnight on postoperative day #1. She was requiring PCA and breakthrough IV pain medication to control her pain. Apparently patient had numerous instances during her stay where she indicated that her needs were not met by staffing conversations with the nursing staff and nursing techs noted that the patient was difficult to please. No clinical complications were  noted during patient's hospital stay and on particularly day #3 patient was stable for discharge to home. On postoperative day #3 received many calls from the patient as well as the daughter prior to my arrival to the floor stating that patient's right ICBG site was infected. I changed the dressing myself on postoperative day #3 and the wound was not infected at all. There is minimal erythema along her flank and this is likely from pressure. Her wound is completely sealed without any drainage.  Patient was covered with Lovenox for DVT and PE prophylaxis during her hospital stay and she will be on aspirin 325 mg every 12 hours for 4 weeks at discharge  Consults: None  Significant Diagnostic Studies: labs:   Results for April, [name] (MRN [id number]) as of 04/12/2014 11:20  Ref. Range 04/04/2014 00:22  WBC Latest Range: 4.0-10.5 K/uL 9.9  RBC Latest Range: 3.87-5.11 MIL/uL 3.63 (L)  Hemoglobin Latest Range: 12.0-15.0 g/dL 11.7 (L)  HCT Latest Range: 36.0-46.0 % 34.2 (L)  MCV Latest Range: 78.0-100.0 fL 94.2  MCH Latest Range: 26.0-34.0 pg 32.2  MCHC Latest Range: 30.0-36.0 g/dL 34.2  RDW Latest Range: 11.5-15.5 % 13.1  Platelets Latest Range: 150-400 K/uL 258    Tissue culture  Status: Final result     Visible to patient:  MyChart     Next appt: None              10d ago     Specimen Description TISSUE RIGHT TIBIA    Special Requests RIGHT DISTAL TIBIA NON UNION    Gram Stain NO WBC SEEN  NO SQUAMOUS EPITHELIAL CELLS SEEN  NO ORGANISMS SEEN  Performed at [facility]  Culture NO GROWTH 3 DAYS  Performed at [facility]        Report Status 04/05/2014 FINAL    Resulting Agency [facility]    Specimen Collected: 04/02/14 10:07 AM Last Resulted: 04/05/14 7:15 AM       Treatments: IV hydration, antibiotics: Ancef, analgesia: acetaminophen, Morphine and oxycodone and Percocet, anticoagulation: LMW heparin, therapies: PT, OT and RN and surgery: As  above  Discharge Exam:   Orthopaedic Trauma Service Progress Note  Subjective  Ready to go home Nursing notes reviewed Pt very upset Able to calm pt down C/o pain R hip Voiding w/o difficulty   Review of Systems  Constitutional: Negative for fever and chills.  Respiratory: Negative for shortness of breath and wheezing.   Cardiovascular: Negative for chest pain and palpitations.  Gastrointestinal: Negative for nausea, vomiting and abdominal pain.  Genitourinary: Negative for dysuria and urgency.  Neurological: Negative for headaches.     Objective   BP 117/58 mmHg  Pulse 103  Temp(Src) 99.2 F (37.3 C) (Oral)  Resp 16  SpO2 100%  Intake/Output       11/12 0701 - 11/13 0700 11/13 0701 - 11/14 0700    P.O.      I.V. 40     Total Intake 40      Urine 300     Total Output 300      Net -260            Urine Occurrence 1 x 2 x      Labs  No new labs   Exam  Gen: awake, alert, NAD, comfortably   Lungs: clear   Cardiac: RRR Ext:        Right Lower Extremity               R hip dressing change, ICBG incision looks great, no drainage               Mild redness posterior flank, likely from pressure             No evidence of infection             Splint c/d/i             Distal motor and sensory functions at baseline               Ext warm             + DP pule               No pain with passive stretch     Assessment and Plan   POD/HD#: 5   43 y/o female s/p repair R tibial shaft nonunion   1. R tibial shaft nonunion               NWB               Ice and elevate             Continue with splint until follow up               Knee ROM as tolerated             PT/OT               Ok to leave floor   2. R ICBG harvest             Dressing changed today  Daily dressing changes, ok to wash with soap and water               Ice prn    3. Pain management:          PO             Percocet             Oxy IR             Robaxin                 4. ABL anemia/Hemodynamics             Stable  5. Medical issues               Continue with home meds  6. DVT/PE prophylaxis:             lovenox               Will dc on ASA 325 po BID x 4 weeks  7. ID:               Completed periop abx   8. Metabolic Bone Disease:             Labs look good             No additional nutritional supplementation needed   9. Activity:             NWB R leg             OOB as tolerated   10. FEN/Foley/Lines:             Diet as tolerated             dc IV             Dc IVF              11. Dispo:             dc home today               Follow up with ortho in 10-14 days, pt will call for appointment        Jari Pigg, PA-C [facility] [phone] (O) 04/05/2014 11:04 AM   Disposition: Home  Discharge Instructions    Call MD / Call 911    Complete by:  As directed   If you experience chest pain or shortness of breath, CALL 911 and be transported to the hospital emergency room.  If you develope a fever above 101 F, pus (white drainage) or increased drainage or redness at the wound, or calf pain, call your surgeon's office.     Constipation Prevention    Complete by:  As directed   Drink plenty of fluids.  Prune juice may be helpful.  You may use a stool softener, such as Colace (over the counter) 100 mg twice a day.  Use MiraLax (over the counter) for constipation as needed.     Diet general    Complete by:  As directed      Discharge instructions    Complete by:  As directed   Orthopaedic Trauma Service Discharge Instructions   General Discharge Instructions  WEIGHT BEARING STATUS: Nonweightbearing Right Leg  RANGE OF MOTION/ACTIVITY: range of motion of R hip and knee as tolerated. Do not remove splint. We will remove at first follow up appointment  Wound Care: daily dressing changes to R  hip. Can leave open to air if no drainage. Ok to wash with soap and water. Do not apply  ointments or lotions to wound.    Diet: as you were eating previously.  Can use over the counter stool softeners and bowel preparations, such as Miralax, to help with bowel movements.  Narcotics can be constipating.  Be sure to drink plenty of fluids  STOP SMOKING OR USING NICOTINE PRODUCTS!!!!  As discussed nicotine severely impairs your body's ability to heal surgical and traumatic wounds but also impairs bone healing.  Wounds and bone heal by forming microscopic blood vessels (angiogenesis) and nicotine is a vasoconstrictor (essentially, shrinks blood vessels).  Therefore, if vasoconstriction occurs to these microscopic blood vessels they essentially disappear and are unable to deliver necessary nutrients to the healing tissue.  This is one modifiable factor that you can do to dramatically increase your chances of healing your injury.    (This means no smoking, no nicotine gum, patches, etc)  DO NOT USE NONSTEROIDAL ANTI-INFLAMMATORY DRUGS (NSAID'S)  Using products such as Advil (ibuprofen), Aleve (naproxen), Motrin (ibuprofen) for additional pain control during fracture healing can delay and/or prevent the healing response.  If you would like to take over the counter (OTC) medication, Tylenol (acetaminophen) is ok.  However, some narcotic medications that are given for pain control contain acetaminophen as well. Therefore, you should not exceed more than 4000 mg of tylenol in a day if you do not have liver disease.  Also note that there are may OTC medicines, such as cold medicines and allergy medicines that my contain tylenol as well.  If you have any questions about medications and/or interactions please ask your doctor/PA or your pharmacist.   PAIN MEDICATION USE AND EXPECTATIONS  You have likely been given narcotic medications to help control your pain.  After a traumatic event that results in an fracture (broken bone) with or without surgery, it is ok to use narcotic pain medications to help  control one's pain.  We understand that everyone responds to pain differently and each individual patient will be evaluated on a regular basis for the continued need for narcotic medications. Ideally, narcotic medication use should last no more than 6-8 weeks (coinciding with fracture healing).   As a patient it is your responsibility as well to monitor narcotic medication use and report the amount and frequency you use these medications when you come to your office visit.   We would also advise that if you are using narcotic medications, you should take a dose prior to therapy to maximize you participation.  IF YOU ARE ON NARCOTIC MEDICATIONS IT IS NOT PERMISSIBLE TO OPERATE A MOTOR VEHICLE (MOTORCYCLE/CAR/TRUCK/MOPED) OR HEAVY MACHINERY DO NOT MIX NARCOTICS WITH OTHER CNS (CENTRAL NERVOUS SYSTEM) DEPRESSANTS SUCH AS ALCOHOL       ICE AND ELEVATE INJURED/OPERATIVE EXTREMITY  Using ice and elevating the injured extremity above your heart can help with swelling and pain control.  Icing in a pulsatile fashion, such as 20 minutes on and 20 minutes off, can be followed.    Do not place ice directly on skin. Make sure there is a barrier between to skin and the ice pack.    Using frozen items such as frozen peas works well as the conform nicely to the are that needs to be iced.  USE AN ACE WRAP OR TED HOSE FOR SWELLING CONTROL  In addition to icing and elevation, Ace wraps or TED hose are used to help limit and resolve  swelling.  It is recommended to use Ace wraps or TED hose until you are informed to stop.    When using Ace Wraps start the wrapping distally (farthest away from the body) and wrap proximally (closer to the body)   Example: If you had surgery on your leg or thing and you do not have a splint on, start the ace wrap at the toes and work your way up to the thigh        If you had surgery on your upper extremity and do not have a splint on, start the ace wrap at your fingers and work your way  up to the upper arm  IF YOU ARE IN A SPLINT OR CAST DO NOT [place]   If your splint gets wet for any reason please contact the office immediately. You may shower in your splint or cast as long as you keep it dry.  This can be done by wrapping in a cast cover or garbage back (or similar)  Do Not stick any thing down your splint or cast such as pencils, money, or hangers to try and scratch yourself with.  If you feel itchy take benadryl as prescribed on the bottle for itching  IF YOU ARE IN A CAM BOOT (BLACK BOOT)  You may remove boot periodically. Perform daily dressing changes as noted below.  Wash the liner of the boot regularly and wear a sock when wearing the boot. It is recommended that you sleep in the boot until told otherwise  CALL THE OFFICE WITH ANY QUESTIONS OR CONCERTS: [phone]     Discharge Pin Site Instructions  Dress pins daily with Kerlix roll starting on POD 2. Wrap the Kerlix so that it tamps the skin down around the pin-skin interface to prevent/limit motion of the skin relative to the pin.  (Pin-skin motion is the primary cause of pain and infection related to external fixator pin sites).  Remove any crust or coagulum that may obstruct drainage with a saline moistened gauze or soap and water.  After POD 3, if there is no discernable drainage on the pin site dressing, the interval for change can by increased to every other day.  You may shower with the fixator, cleaning all pin sites gently with soap and water.  If you have a surgical wound this needs to be completely dry and without drainage before showering.  The extremity can be lifted by the fixator to facilitate wound care and transfers.  Notify the office/Doctor if you experience increasing drainage, redness, or pain from a pin site, or if you notice purulent (thick, snot-like) drainage.  Discharge Wound Care Instructions  Do NOT apply any ointments, solutions or lotions to pin sites or  surgical wounds.  These prevent needed drainage and even though solutions like hydrogen peroxide kill bacteria, they also damage cells lining the pin sites that help fight infection.  Applying lotions or ointments can keep the wounds moist and can cause them to breakdown and open up as well. This can increase the risk for infection. When in doubt call the office.  Surgical incisions should be dressed daily.  If any drainage is noted, use one layer of adaptic, then gauze, Kerlix, and an ace wrap.  Once the incision is completely dry and without drainage, it may be left open to air out.  Showering may begin 36-48 hours later.  Cleaning gently with soap and water.  Traumatic wounds should be dressed daily as well.  One layer of adaptic, gauze, Kerlix, then ace wrap.  The adaptic can be discontinued once the draining has ceased    If you have a wet to dry dressing: wet the gauze with saline the squeeze as much saline out so the gauze is moist (not soaking wet), place moistened gauze over wound, then place a dry gauze over the moist one, followed by Kerlix wrap, then ace wrap.     Driving restrictions    Complete by:  As directed   No driving     Increase activity slowly as tolerated    Complete by:  As directed      Non weight bearing    Complete by:  As directed   Laterality:  right  Extremity:  Lower            Medication List    STOP taking these medications        acetaminophen 500 MG tablet  Commonly known as:  TYLENOL      TAKE these medications        albuterol 108 (90 BASE) MCG/ACT inhaler  Commonly known as:  PROVENTIL HFA;VENTOLIN HFA  Inhale 2 puffs into the lungs every 6 (six) hours as needed for wheezing or shortness of breath.     aspirin EC 325 MG tablet  Take 1 tablet (325 mg total) by mouth every 12 (twelve) hours.     diazepam 5 MG tablet  Commonly known as:  VALIUM  Take 5 mg by mouth 2 (two) times daily as needed for anxiety.     DSS 100 MG Caps  Take  100 mg by mouth 2 (two) times daily.     gabapentin 300 MG capsule  Commonly known as:  NEURONTIN  Take 1 capsule (300 mg total) by mouth 2 (two) times daily.     methocarbamol 500 MG tablet  Commonly known as:  ROBAXIN  Take 1-2 tablets (500-1,000 mg total) by mouth every 6 (six) hours as needed for muscle spasms.     montelukast 10 MG tablet  Commonly known as:  SINGULAIR  Take 10 mg by mouth at bedtime.     oxyCODONE 5 MG immediate release tablet  Commonly known as:  Oxy IR/ROXICODONE  Take 1-2 tablets (5-10 mg total) by mouth every 6 (six) hours as needed for breakthrough pain (take between percocet doses for breakthrough pain).     oxyCODONE-acetaminophen 5-325 MG per tablet  Commonly known as:  PERCOCET/ROXICET  Take 1-2 tablets by mouth every 6 (six) hours as needed for moderate pain.     promethazine 25 MG tablet  Commonly known as:  PHENERGAN  Take 25 mg by mouth every 6 (six) hours as needed for nausea or vomiting.           Follow-up Information    Follow up with HANDY,MICHAEL H, MD. Schedule an appointment as soon as possible for a visit in 10 days.   Specialty:  Orthopedic Surgery   Why:  For suture removal, For wound re-check   Contact information:   [address] [place] [address] [phone]       Discharge Instructions and Plan:  The patient has had a long-standing nonunion of her right tibia. We are hopeful that she will go on and unite after revision ORIF and autografting. Patient will be nonweightbearing for 6-8 weeks and will remain in her splint for about 2 weeks before we initiate range of motion.  She'll be on aspirin for  DVT and PE prophylaxis until further notice  She can continue with her activities as tolerated while maintaining weight bearing restrictions  We'll see her back in the office in 10-14 days for reevaluation  Although the patient has been able to be compliant with smoking cessation I am concerned that she  may revert back to smoking which could cause complications. This is been reviewed with the patient in great detail  Patient to contact the office with any questions or concerns   Signed:  Jari Pigg, PA-C [facility] [phone] (P) 04/05/2014, 11:23 AM

## 2014-04-05 NOTE — Plan of Care (Signed)
Problem: Phase III Progression Outcomes Goal: Ambulate BID Outcome: Completed/Met Date Met:  04/05/14

## 2014-04-05 NOTE — Progress Notes (Signed)
PT Cancellation Note  Patient Details Name: April [name] MRN: [id number] DOB: 03/04/1971   Cancelled Treatment:    Reason Eval/Treat Not Completed: Patient declined, no reason specified .  Pt. Refused to practice with PT, is upset with "how I've been treated" and naming multiple complaints.  Pt. Did not want to discuss with unit director, stating "I will go way above that".  Anticipate DC today.  Pt. Confirms that ramp has been built at her home.  Will sign off due to anticipated Dc and pt. Refusal.  Ladona Ridgel 04/05/2014, 11:59 AM Gerlean Ren PT [facility] [phone] [place] [phone]

## 2014-04-05 NOTE — Progress Notes (Signed)
Patient ready for discharge. Daughter will provide transportation.

## 2014-04-05 NOTE — Plan of Care (Signed)
Problem: Discharge Progression Outcomes Goal: Barriers To Progression Addressed/Resolved Outcome: Completed/Met Date Met:  04/05/14 Goal: Pain controlled with appropriate interventions Outcome: Completed/Met Date Met:  04/05/14 Goal: Other Discharge Outcomes/Goals Outcome: Not Applicable Date Met:  79/02/40

## 2014-04-05 NOTE — Discharge Instructions (Signed)
Orthopaedic Trauma Service Discharge Instructions   General Discharge Instructions  WEIGHT BEARING STATUS: Nonweightbearing Right Leg  RANGE OF MOTION/ACTIVITY: range of motion of R hip and knee as tolerated. Do not remove splint. We will remove at first follow up appointment  Wound Care: daily dressing changes to R hip. Can leave open to air if no drainage. Ok to wash with soap and water. Do not apply ointments or lotions to wound.    Diet: as you were eating previously.  Can use over the counter stool softeners and bowel preparations, such as Miralax, to help with bowel movements.  Narcotics can be constipating.  Be sure to drink plenty of fluids  STOP SMOKING OR USING NICOTINE PRODUCTS!!!!  As discussed nicotine severely impairs your body's ability to heal surgical and traumatic wounds but also impairs bone healing.  Wounds and bone heal by forming microscopic blood vessels (angiogenesis) and nicotine is a vasoconstrictor (essentially, shrinks blood vessels).  Therefore, if vasoconstriction occurs to these microscopic blood vessels they essentially disappear and are unable to deliver necessary nutrients to the healing tissue.  This is one modifiable factor that you can do to dramatically increase your chances of healing your injury.    (This means no smoking, no nicotine gum, patches, etc)  DO NOT USE NONSTEROIDAL ANTI-INFLAMMATORY DRUGS (NSAID'S)  Using products such as Advil (ibuprofen), Aleve (naproxen), Motrin (ibuprofen) for additional pain control during fracture healing can delay and/or prevent the healing response.  If you would like to take over the counter (OTC) medication, Tylenol (acetaminophen) is ok.  However, some narcotic medications that are given for pain control contain acetaminophen as well. Therefore, you should not exceed more than 4000 mg of tylenol in a day if you do not have liver disease.  Also note that there are may OTC medicines, such as cold medicines and allergy  medicines that my contain tylenol as well.  If you have any questions about medications and/or interactions please ask your doctor/PA or your pharmacist.   PAIN MEDICATION USE AND EXPECTATIONS  You have likely been given narcotic medications to help control your pain.  After a traumatic event that results in an fracture (broken bone) with or without surgery, it is ok to use narcotic pain medications to help control one's pain.  We understand that everyone responds to pain differently and each individual patient will be evaluated on a regular basis for the continued need for narcotic medications. Ideally, narcotic medication use should last no more than 6-8 weeks (coinciding with fracture healing).   As a patient it is your responsibility as well to monitor narcotic medication use and report the amount and frequency you use these medications when you come to your office visit.   We would also advise that if you are using narcotic medications, you should take a dose prior to therapy to maximize you participation.  IF YOU ARE ON NARCOTIC MEDICATIONS IT IS NOT PERMISSIBLE TO OPERATE A MOTOR VEHICLE (MOTORCYCLE/CAR/TRUCK/MOPED) OR HEAVY MACHINERY DO NOT MIX NARCOTICS WITH OTHER CNS (CENTRAL NERVOUS SYSTEM) DEPRESSANTS SUCH AS ALCOHOL       ICE AND ELEVATE INJURED/OPERATIVE EXTREMITY  Using ice and elevating the injured extremity above your heart can help with swelling and pain control.  Icing in a pulsatile fashion, such as 20 minutes on and 20 minutes off, can be followed.    Do not place ice directly on skin. Make sure there is a barrier between to skin and the ice pack.    Using  frozen items such as frozen peas works well as the conform nicely to the are that needs to be iced.  USE AN ACE WRAP OR TED HOSE FOR SWELLING CONTROL  In addition to icing and elevation, Ace wraps or TED hose are used to help limit and resolve swelling.  It is recommended to use Ace wraps or TED hose until you are informed  to stop.    When using Ace Wraps start the wrapping distally (farthest away from the body) and wrap proximally (closer to the body)   Example: If you had surgery on your leg or thing and you do not have a splint on, start the ace wrap at the toes and work your way up to the thigh        If you had surgery on your upper extremity and do not have a splint on, start the ace wrap at your fingers and work your way up to the upper arm  IF YOU ARE IN A SPLINT OR CAST DO NOT [place]   If your splint gets wet for any reason please contact the office immediately. You may shower in your splint or cast as long as you keep it dry.  This can be done by wrapping in a cast cover or garbage back (or similar)  Do Not stick any thing down your splint or cast such as pencils, money, or hangers to try and scratch yourself with.  If you feel itchy take benadryl as prescribed on the bottle for itching  IF YOU ARE IN A CAM BOOT (BLACK BOOT)  You may remove boot periodically. Perform daily dressing changes as noted below.  Wash the liner of the boot regularly and wear a sock when wearing the boot. It is recommended that you sleep in the boot until told otherwise  CALL THE OFFICE WITH ANY QUESTIONS OR CONCERTS: [phone]     Discharge Pin Site Instructions  Dress pins daily with Kerlix roll starting on POD 2. Wrap the Kerlix so that it tamps the skin down around the pin-skin interface to prevent/limit motion of the skin relative to the pin.  (Pin-skin motion is the primary cause of pain and infection related to external fixator pin sites).  Remove any crust or coagulum that may obstruct drainage with a saline moistened gauze or soap and water.  After POD 3, if there is no discernable drainage on the pin site dressing, the interval for change can by increased to every other day.  You may shower with the fixator, cleaning all pin sites gently with soap and water.  If you have a surgical wound this  needs to be completely dry and without drainage before showering.  The extremity can be lifted by the fixator to facilitate wound care and transfers.  Notify the office/Doctor if you experience increasing drainage, redness, or pain from a pin site, or if you notice purulent (thick, snot-like) drainage.  Discharge Wound Care Instructions  Do NOT apply any ointments, solutions or lotions to pin sites or surgical wounds.  These prevent needed drainage and even though solutions like hydrogen peroxide kill bacteria, they also damage cells lining the pin sites that help fight infection.  Applying lotions or ointments can keep the wounds moist and can cause them to breakdown and open up as well. This can increase the risk for infection. When in doubt call the office.  Surgical incisions should be dressed daily.  If any drainage is noted, use one layer  of adaptic, then gauze, Kerlix, and an ace wrap.  Once the incision is completely dry and without drainage, it may be left open to air out.  Showering may begin 36-48 hours later.  Cleaning gently with soap and water.  Traumatic wounds should be dressed daily as well.    One layer of adaptic, gauze, Kerlix, then ace wrap.  The adaptic can be discontinued once the draining has ceased    If you have a wet to dry dressing: wet the gauze with saline the squeeze as much saline out so the gauze is moist (not soaking wet), place moistened gauze over wound, then place a dry gauze over the moist one, followed by Kerlix wrap, then ace wrap.

## 2014-04-07 LAB — ANAEROBIC CULTURE: GRAM STAIN: NONE SEEN

## 2014-06-25 ENCOUNTER — Other Ambulatory Visit: Payer: Self-pay | Admitting: Orthopedic Surgery

## 2014-06-25 DIAGNOSIS — S82241K Displaced spiral fracture of shaft of right tibia, subsequent encounter for closed fracture with nonunion: Secondary | ICD-10-CM

## 2014-06-26 ENCOUNTER — Other Ambulatory Visit: Payer: Medicaid Other

## 2014-06-28 ENCOUNTER — Ambulatory Visit
Admission: RE | Admit: 2014-06-28 | Discharge: 2014-06-28 | Disposition: A | Discharge status: Home / Self Care | Payer: Medicaid Other | Source: Ambulatory Visit | Attending: Orthopedic Surgery | Admitting: Orthopedic Surgery

## 2014-06-28 DIAGNOSIS — S82241K Displaced spiral fracture of shaft of right tibia, subsequent encounter for closed fracture with nonunion: Secondary | ICD-10-CM

## 2014-07-15 ENCOUNTER — Encounter (HOSPITAL_COMMUNITY): Payer: Self-pay | Admitting: *Deleted

## 2014-07-16 ENCOUNTER — Observation Stay (HOSPITAL_COMMUNITY)
Admission: RE | Admit: 2014-07-16 | Discharge: 2014-07-18 | Disposition: A | Discharge status: Home / Self Care | Payer: Medicaid Other | Source: Ambulatory Visit | Attending: Orthopedic Surgery | Admitting: Orthopedic Surgery

## 2014-07-16 ENCOUNTER — Inpatient Hospital Stay (HOSPITAL_COMMUNITY): Payer: Medicaid Other | Admitting: Anesthesiology

## 2014-07-16 ENCOUNTER — Encounter (HOSPITAL_COMMUNITY)
Admission: RE | Disposition: A | Discharge status: Home / Self Care | Payer: Self-pay | Source: Ambulatory Visit | Attending: Orthopedic Surgery

## 2014-07-16 ENCOUNTER — Inpatient Hospital Stay (HOSPITAL_COMMUNITY): Payer: Medicaid Other

## 2014-07-16 ENCOUNTER — Observation Stay (HOSPITAL_COMMUNITY): Payer: Medicaid Other

## 2014-07-16 ENCOUNTER — Encounter (HOSPITAL_COMMUNITY): Payer: Self-pay

## 2014-07-16 DIAGNOSIS — K589 Irritable bowel syndrome without diarrhea: Secondary | ICD-10-CM | POA: Diagnosis not present

## 2014-07-16 DIAGNOSIS — F909 Attention-deficit hyperactivity disorder, unspecified type: Secondary | ICD-10-CM | POA: Diagnosis not present

## 2014-07-16 DIAGNOSIS — F419 Anxiety disorder, unspecified: Secondary | ICD-10-CM | POA: Diagnosis not present

## 2014-07-16 DIAGNOSIS — S82209A Unspecified fracture of shaft of unspecified tibia, initial encounter for closed fracture: Secondary | ICD-10-CM | POA: Insufficient documentation

## 2014-07-16 DIAGNOSIS — J449 Chronic obstructive pulmonary disease, unspecified: Secondary | ICD-10-CM | POA: Insufficient documentation

## 2014-07-16 DIAGNOSIS — X58XXXD Exposure to other specified factors, subsequent encounter: Secondary | ICD-10-CM | POA: Diagnosis not present

## 2014-07-16 DIAGNOSIS — Z419 Encounter for procedure for purposes other than remedying health state, unspecified: Secondary | ICD-10-CM

## 2014-07-16 DIAGNOSIS — S82301P Unspecified fracture of lower end of right tibia, subsequent encounter for closed fracture with malunion: Secondary | ICD-10-CM | POA: Diagnosis present

## 2014-07-16 DIAGNOSIS — Z859 Personal history of malignant neoplasm, unspecified: Secondary | ICD-10-CM | POA: Diagnosis not present

## 2014-07-16 DIAGNOSIS — G709 Myoneural disorder, unspecified: Secondary | ICD-10-CM | POA: Insufficient documentation

## 2014-07-16 DIAGNOSIS — Z9889 Other specified postprocedural states: Secondary | ICD-10-CM

## 2014-07-16 DIAGNOSIS — F329 Major depressive disorder, single episode, unspecified: Secondary | ICD-10-CM | POA: Diagnosis not present

## 2014-07-16 DIAGNOSIS — M818 Other osteoporosis without current pathological fracture: Secondary | ICD-10-CM | POA: Diagnosis not present

## 2014-07-16 DIAGNOSIS — J45909 Unspecified asthma, uncomplicated: Secondary | ICD-10-CM | POA: Diagnosis not present

## 2014-07-16 DIAGNOSIS — K219 Gastro-esophageal reflux disease without esophagitis: Secondary | ICD-10-CM | POA: Diagnosis not present

## 2014-07-16 HISTORY — DX: Depression, unspecified: F32.A

## 2014-07-16 HISTORY — PX: TIBIA OSTEOTOMY: SHX1065

## 2014-07-16 HISTORY — PX: HARDWARE REMOVAL: SHX979

## 2014-07-16 HISTORY — DX: Reserved for inherently not codable concepts without codable children: IMO0001

## 2014-07-16 HISTORY — DX: Major depressive disorder, single episode, unspecified: F32.9

## 2014-07-16 LAB — CBC
HCT: 39.1 % (ref 36.0–46.0)
HCT: 33.5 % — ABNORMAL LOW (ref 36.0–46.0)
HEMOGLOBIN: 11.3 g/dL — AB (ref 12.0–15.0)
Hemoglobin: 13.1 g/dL (ref 12.0–15.0)
MCH: 30.3 pg (ref 26.0–34.0)
MCH: 30.6 pg (ref 26.0–34.0)
MCHC: 33.5 g/dL (ref 30.0–36.0)
MCHC: 33.7 g/dL (ref 30.0–36.0)
MCV: 90.3 fL (ref 78.0–100.0)
MCV: 90.8 fL (ref 78.0–100.0)
Platelets: 291 10*3/uL (ref 150–400)
Platelets: 262 10*3/uL (ref 150–400)
RBC: 4.33 MIL/uL (ref 3.87–5.11)
RBC: 3.69 MIL/uL — ABNORMAL LOW (ref 3.87–5.11)
RDW: 13.1 % (ref 11.5–15.5)
RDW: 13.1 % (ref 11.5–15.5)
WBC: 7.4 10*3/uL (ref 4.0–10.5)
WBC: 9.3 10*3/uL (ref 4.0–10.5)

## 2014-07-16 LAB — BASIC METABOLIC PANEL
Anion gap: 4 — ABNORMAL LOW (ref 5–15)
CALCIUM: 8.9 mg/dL (ref 8.4–10.5)
CO2: 26 mmol/L (ref 19–32)
Chloride: 109 mmol/L (ref 96–112)
Creatinine, Ser: 0.57 mg/dL (ref 0.50–1.10)
GFR calc non Af Amer: >90 mL/min (ref >90)
GLUCOSE: 102 mg/dL — AB (ref 70–99)
POTASSIUM: 3.7 mmol/L (ref 3.5–5.1)
Sodium: 139 mmol/L (ref 135–145)

## 2014-07-16 LAB — CREATININE, SERUM
Creatinine, Ser: 0.64 mg/dL (ref 0.50–1.10)
GFR calc non Af Amer: >90 mL/min (ref >90)

## 2014-07-16 SURGERY — OSTEOTOMY, TIBIA
Anesthesia: General | Site: Leg Lower | Laterality: Right

## 2014-07-16 MED ORDER — BISACODYL 5 MG PO TBEC: 5.0000 mg | DELAYED_RELEASE_TABLET | Freq: Every day | ORAL | Status: DC | PRN

## 2014-07-16 MED ORDER — MIDAZOLAM HCL 5 MG/5ML IJ SOLN
INTRAMUSCULAR | Status: DC | PRN
  Administered 2014-07-16: 2 mg via INTRAVENOUS

## 2014-07-16 MED ORDER — NEOSTIGMINE METHYLSULFATE 10 MG/10ML IV SOLN
INTRAVENOUS | Status: DC | PRN
  Administered 2014-07-16: 3 mg via INTRAVENOUS

## 2014-07-16 MED ORDER — MEPERIDINE HCL 25 MG/ML IJ SOLN: 6.2500 mg | INTRAMUSCULAR | Status: DC | PRN

## 2014-07-16 MED ORDER — ALBUTEROL SULFATE (2.5 MG/3ML) 0.083% IN NEBU: 2.5000 mg | INHALATION_SOLUTION | Freq: Four times a day (QID) | RESPIRATORY_TRACT | Status: DC | PRN

## 2014-07-16 MED ORDER — LACTATED RINGERS IV SOLN
INTRAVENOUS | Status: DC | PRN
  Administered 2014-07-16 (×2): via INTRAVENOUS

## 2014-07-16 MED ORDER — MONTELUKAST SODIUM 10 MG PO TABS
10.0000 mg | ORAL_TABLET | Freq: Every day | ORAL | Status: DC
  Administered 2014-07-16 – 2014-07-17 (×2): 10 mg via ORAL
  Filled 2014-07-16 (×2): qty 1

## 2014-07-16 MED ORDER — CEFAZOLIN SODIUM 1-5 GM-% IV SOLN
1.0000 g | Freq: Four times a day (QID) | INTRAVENOUS | Status: AC
  Administered 2014-07-16 – 2014-07-17 (×2): 1 g via INTRAVENOUS
  Filled 2014-07-16 (×3): qty 50

## 2014-07-16 MED ORDER — ROCURONIUM BROMIDE 50 MG/5ML IV SOLN
INTRAVENOUS | Status: AC
  Filled 2014-07-16: qty 1

## 2014-07-16 MED ORDER — ALBUTEROL SULFATE HFA 108 (90 BASE) MCG/ACT IN AERS: 2.0000 | INHALATION_SPRAY | Freq: Four times a day (QID) | RESPIRATORY_TRACT | Status: DC | PRN

## 2014-07-16 MED ORDER — LIDOCAINE HCL (CARDIAC) 20 MG/ML IV SOLN
INTRAVENOUS | Status: DC | PRN
  Administered 2014-07-16: 50 mg via INTRAVENOUS

## 2014-07-16 MED ORDER — DOCUSATE SODIUM 100 MG PO CAPS
100.0000 mg | ORAL_CAPSULE | Freq: Two times a day (BID) | ORAL | Status: DC
  Administered 2014-07-16 – 2014-07-17 (×3): 100 mg via ORAL
  Filled 2014-07-16 (×3): qty 1

## 2014-07-16 MED ORDER — FENTANYL CITRATE 0.05 MG/ML IJ SOLN
INTRAMUSCULAR | Status: AC
  Filled 2014-07-16: qty 5

## 2014-07-16 MED ORDER — MIDAZOLAM HCL 2 MG/2ML IJ SOLN
INTRAMUSCULAR | Status: AC
  Filled 2014-07-16: qty 2

## 2014-07-16 MED ORDER — PROPOFOL 10 MG/ML IV BOLUS
INTRAVENOUS | Status: DC | PRN
Start: 2014-07-16 — End: 2014-07-16
  Administered 2014-07-16: 150 mg via INTRAVENOUS

## 2014-07-16 MED ORDER — ONDANSETRON HCL 4 MG/2ML IJ SOLN
4.0000 mg | Freq: Four times a day (QID) | INTRAMUSCULAR | Status: DC | PRN
  Filled 2014-07-16: qty 2

## 2014-07-16 MED ORDER — ROPIVACAINE HCL 5 MG/ML IJ SOLN
INTRAMUSCULAR | Status: DC | PRN
  Administered 2014-07-16: 10 mL via PERINEURAL
  Administered 2014-07-16: 20 mL via PERINEURAL

## 2014-07-16 MED ORDER — NEOSTIGMINE METHYLSULFATE 10 MG/10ML IV SOLN
INTRAVENOUS | Status: AC
  Filled 2014-07-16: qty 1

## 2014-07-16 MED ORDER — HYDROXYZINE HCL 25 MG PO TABS
50.0000 mg | ORAL_TABLET | Freq: Three times a day (TID) | ORAL | Status: DC | PRN
  Administered 2014-07-17: 50 mg via ORAL
  Filled 2014-07-16: qty 2

## 2014-07-16 MED ORDER — CEFAZOLIN SODIUM-DEXTROSE 2-3 GM-% IV SOLR
INTRAVENOUS | Status: AC
  Filled 2014-07-16: qty 50

## 2014-07-16 MED ORDER — SENNOSIDES-DOCUSATE SODIUM 8.6-50 MG PO TABS: 1.0000 | ORAL_TABLET | Freq: Every evening | ORAL | Status: DC | PRN

## 2014-07-16 MED ORDER — OXYCODONE HCL 5 MG PO TABS
5.0000 mg | ORAL_TABLET | ORAL | Status: DC | PRN
  Administered 2014-07-17 (×3): 10 mg via ORAL
  Administered 2014-07-17: 5 mg via ORAL
  Administered 2014-07-17: 10 mg via ORAL
  Filled 2014-07-16 (×2): qty 2
  Filled 2014-07-16 (×2): qty 1
  Filled 2014-07-16 (×4): qty 2

## 2014-07-16 MED ORDER — DIAZEPAM 5 MG PO TABS
5.0000 mg | ORAL_TABLET | Freq: Two times a day (BID) | ORAL | Status: DC | PRN
  Administered 2014-07-16 – 2014-07-17 (×3): 5 mg via ORAL
  Filled 2014-07-16 (×3): qty 1

## 2014-07-16 MED ORDER — FENTANYL CITRATE 0.05 MG/ML IJ SOLN
INTRAMUSCULAR | Status: DC | PRN
  Administered 2014-07-16: 100 ug via INTRAVENOUS
  Administered 2014-07-16 (×3): 50 ug via INTRAVENOUS
  Administered 2014-07-16: 100 ug via INTRAVENOUS
  Administered 2014-07-16 (×3): 50 ug via INTRAVENOUS

## 2014-07-16 MED ORDER — DIPHENHYDRAMINE HCL 12.5 MG/5ML PO ELIX
12.5000 mg | ORAL_SOLUTION | Freq: Four times a day (QID) | ORAL | Status: DC | PRN
Start: 2014-07-16 — End: 2014-07-17
  Filled 2014-07-16: qty 5

## 2014-07-16 MED ORDER — METOCLOPRAMIDE HCL 5 MG/ML IJ SOLN: 5.0000 mg | Freq: Three times a day (TID) | INTRAMUSCULAR | Status: DC | PRN

## 2014-07-16 MED ORDER — SODIUM CHLORIDE 0.9 % IJ SOLN
9.0000 mL | INTRAMUSCULAR | Status: DC | PRN
Start: 2014-07-16 — End: 2014-07-17

## 2014-07-16 MED ORDER — GLYCOPYRROLATE 0.2 MG/ML IJ SOLN
INTRAMUSCULAR | Status: AC
  Filled 2014-07-16: qty 2

## 2014-07-16 MED ORDER — HYDROMORPHONE 0.3 MG/ML IV SOLN
INTRAVENOUS | Status: DC
  Administered 2014-07-16: 13:00:00 via INTRAVENOUS
  Administered 2014-07-16: 4.5 mg via INTRAVENOUS
  Administered 2014-07-17: via INTRAVENOUS
  Filled 2014-07-16: qty 25

## 2014-07-16 MED ORDER — MAGNESIUM CITRATE PO SOLN: 1.0000 | Freq: Once | ORAL | Status: AC | PRN

## 2014-07-16 MED ORDER — METHOCARBAMOL 500 MG PO TABS
500.0000 mg | ORAL_TABLET | Freq: Four times a day (QID) | ORAL | Status: DC | PRN
  Administered 2014-07-16 – 2014-07-17 (×2): 1000 mg via ORAL
  Filled 2014-07-16 (×2): qty 2

## 2014-07-16 MED ORDER — ONDANSETRON HCL 4 MG/2ML IJ SOLN: 4.0000 mg | Freq: Four times a day (QID) | INTRAMUSCULAR | Status: DC | PRN

## 2014-07-16 MED ORDER — ONDANSETRON HCL 4 MG PO TABS
4.0000 mg | ORAL_TABLET | Freq: Four times a day (QID) | ORAL | Status: DC | PRN
  Administered 2014-07-17: 4 mg via ORAL
  Filled 2014-07-16: qty 1

## 2014-07-16 MED ORDER — ROCURONIUM BROMIDE 100 MG/10ML IV SOLN
INTRAVENOUS | Status: DC | PRN
  Administered 2014-07-16: 40 mg via INTRAVENOUS

## 2014-07-16 MED ORDER — GABAPENTIN 300 MG PO CAPS
600.0000 mg | ORAL_CAPSULE | Freq: Three times a day (TID) | ORAL | Status: DC
  Administered 2014-07-16: 600 mg via ORAL
  Filled 2014-07-16: qty 2

## 2014-07-16 MED ORDER — LIDOCAINE HCL (CARDIAC) 20 MG/ML IV SOLN
INTRAVENOUS | Status: AC
  Filled 2014-07-16: qty 5

## 2014-07-16 MED ORDER — DIPHENHYDRAMINE HCL 50 MG/ML IJ SOLN
12.5000 mg | Freq: Four times a day (QID) | INTRAMUSCULAR | Status: DC | PRN
  Filled 2014-07-16: qty 0.25

## 2014-07-16 MED ORDER — ONDANSETRON HCL 4 MG/2ML IJ SOLN
INTRAMUSCULAR | Status: AC
  Filled 2014-07-16: qty 2

## 2014-07-16 MED ORDER — HYDROMORPHONE 0.3 MG/ML IV SOLN
INTRAVENOUS | Status: AC
  Filled 2014-07-16: qty 25

## 2014-07-16 MED ORDER — GLYCOPYRROLATE 0.2 MG/ML IJ SOLN
INTRAMUSCULAR | Status: DC | PRN
  Administered 2014-07-16: 0.4 mg via INTRAVENOUS

## 2014-07-16 MED ORDER — NALOXONE HCL 0.4 MG/ML IJ SOLN
0.4000 mg | INTRAMUSCULAR | Status: DC | PRN
  Filled 2014-07-16: qty 1

## 2014-07-16 MED ORDER — ENOXAPARIN SODIUM 40 MG/0.4ML ~~LOC~~ SOLN
40.0000 mg | SUBCUTANEOUS | Status: DC
  Administered 2014-07-17: 40 mg via SUBCUTANEOUS
  Filled 2014-07-16: qty 0.4

## 2014-07-16 MED ORDER — HYDROMORPHONE HCL 1 MG/ML IJ SOLN
INTRAMUSCULAR | Status: AC
  Administered 2014-07-16: 0.5 mg via INTRAVENOUS
  Filled 2014-07-16: qty 1

## 2014-07-16 MED ORDER — 0.9 % SODIUM CHLORIDE (POUR BTL) OPTIME
TOPICAL | Status: DC | PRN
  Administered 2014-07-16: 1000 mL

## 2014-07-16 MED ORDER — PROPOFOL 10 MG/ML IV BOLUS
INTRAVENOUS | Status: AC
  Filled 2014-07-16: qty 20

## 2014-07-16 MED ORDER — OXYCODONE-ACETAMINOPHEN 5-325 MG PO TABS
1.0000 | ORAL_TABLET | ORAL | Status: DC | PRN
  Administered 2014-07-17 (×2): 2 via ORAL
  Filled 2014-07-16 (×2): qty 2

## 2014-07-16 MED ORDER — METOCLOPRAMIDE HCL 10 MG PO TABS
5.0000 mg | ORAL_TABLET | Freq: Three times a day (TID) | ORAL | Status: DC | PRN
  Administered 2014-07-17: 10 mg via ORAL
  Filled 2014-07-16 (×2): qty 1

## 2014-07-16 MED ORDER — POTASSIUM CHLORIDE IN NACL 20-0.9 MEQ/L-% IV SOLN
INTRAVENOUS | Status: DC
  Administered 2014-07-17: via INTRAVENOUS
  Filled 2014-07-16 (×4): qty 1000

## 2014-07-16 MED ORDER — PROMETHAZINE HCL 25 MG/ML IJ SOLN: 6.2500 mg | INTRAMUSCULAR | Status: DC | PRN

## 2014-07-16 MED ORDER — ONDANSETRON HCL 4 MG/2ML IJ SOLN
INTRAMUSCULAR | Status: DC | PRN
  Administered 2014-07-16: 4 mg via INTRAVENOUS

## 2014-07-16 MED ORDER — HYDROMORPHONE HCL 1 MG/ML IJ SOLN
0.2500 mg | INTRAMUSCULAR | Status: DC | PRN
  Administered 2014-07-16 (×2): 0.5 mg via INTRAVENOUS

## 2014-07-16 MED ORDER — CEFAZOLIN SODIUM-DEXTROSE 2-3 GM-% IV SOLR
INTRAVENOUS | Status: DC | PRN
  Administered 2014-07-16: 2 g via INTRAVENOUS

## 2014-07-16 SURGICAL SUPPLY — 86 items
BANDAGE ELASTIC 4 VELCRO ST LF (GAUZE/BANDAGES/DRESSINGS) ×3 IMPLANT
BANDAGE ELASTIC 6 VELCRO ST LF (GAUZE/BANDAGES/DRESSINGS) ×3 IMPLANT
BANDAGE ESMARK 6X9 LF (GAUZE/BANDAGES/DRESSINGS) ×1 IMPLANT
BIT DRILL LCP QC 2X140 (BIT) ×3 IMPLANT
BLADE LONG MED 31MMX9MM (MISCELLANEOUS) ×1
BLADE LONG MED 31X9 (MISCELLANEOUS) ×2 IMPLANT
BLADE SURG 10 STRL SS (BLADE) ×3 IMPLANT
BLADE SURG CLIPPER 3M 9600 (MISCELLANEOUS) IMPLANT
BNDG COHESIVE 4X5 TAN STRL (GAUZE/BANDAGES/DRESSINGS) ×3 IMPLANT
BNDG ESMARK 6X9 LF (GAUZE/BANDAGES/DRESSINGS) ×3
BONE CANC CHIPS 20CC PCAN1/4 (Bone Implant) ×3 IMPLANT
CHIPS CANC BONE 20CC PCAN1/4 (Bone Implant) ×1 IMPLANT
CLEANER TIP ELECTROSURG 2X2 (MISCELLANEOUS) IMPLANT
COVER MAYO STAND STRL (DRAPES) ×3 IMPLANT
COVER SURGICAL LIGHT HANDLE (MISCELLANEOUS) IMPLANT
CUFF TOURNIQUET SINGLE 24IN (TOURNIQUET CUFF) ×3 IMPLANT
CUFF TOURNIQUET SINGLE 34IN LL (TOURNIQUET CUFF) ×3 IMPLANT
CUFF TOURNIQUET SINGLE 44IN (TOURNIQUET CUFF) IMPLANT
DRAPE C-ARM 42X72 X-RAY (DRAPES) ×3 IMPLANT
DRAPE C-ARMOR (DRAPES) ×3 IMPLANT
DRAPE IMP U-DRAPE 54X76 (DRAPES) IMPLANT
DRAPE ORTHO SPLIT 77X108 STRL (DRAPES)
DRAPE SURG ORHT 6 SPLT 77X108 (DRAPES) IMPLANT
DRAPE U-SHAPE 47X51 STRL (DRAPES) ×3 IMPLANT
DRSG ADAPTIC 3X8 NADH LF (GAUZE/BANDAGES/DRESSINGS) ×3 IMPLANT
DRSG PAD ABDOMINAL 8X10 ST (GAUZE/BANDAGES/DRESSINGS) ×3 IMPLANT
DURAPREP 26ML APPLICATOR (WOUND CARE) IMPLANT
ELECT REM PT RETURN 9FT ADLT (ELECTROSURGICAL) ×3
ELECTRODE REM PT RTRN 9FT ADLT (ELECTROSURGICAL) ×1 IMPLANT
EVACUATOR 1/8 PVC DRAIN (DRAIN) IMPLANT
FACESHIELD OPICON LG (MASK) IMPLANT
GAUZE SPONGE 4X4 12PLY STRL (GAUZE/BANDAGES/DRESSINGS) ×3 IMPLANT
GLOVE BIO SURGEON STRL SZ7.5 (GLOVE) ×6 IMPLANT
GLOVE BIOGEL PI IND STRL 7.5 (GLOVE) ×2 IMPLANT
GLOVE BIOGEL PI IND STRL 8 (GLOVE) ×1 IMPLANT
GLOVE BIOGEL PI INDICATOR 7.5 (GLOVE) ×4
GLOVE BIOGEL PI INDICATOR 8 (GLOVE) ×2
GLOVE BIOGEL PI ORTHO PRO SZ7 (GLOVE) ×2
GLOVE PI ORTHO PRO STRL SZ7 (GLOVE) ×1 IMPLANT
GLOVE SURG SS PI 6.5 STRL IVOR (GLOVE) ×3 IMPLANT
GOWN EXTRA PROTECTION XXL 0583 (GOWNS) IMPLANT
GOWN STRL REUS W/ TWL LRG LVL3 (GOWN DISPOSABLE) ×2 IMPLANT
GOWN STRL REUS W/ TWL XL LVL3 (GOWN DISPOSABLE) ×1 IMPLANT
GOWN STRL REUS W/TWL LRG LVL3 (GOWN DISPOSABLE) ×4
GOWN STRL REUS W/TWL XL LVL3 (GOWN DISPOSABLE) ×2
IMMOBILIZER KNEE 22 UNIV (SOFTGOODS) IMPLANT
KIT BASIN OR (CUSTOM PROCEDURE TRAY) ×3 IMPLANT
KIT INFUSE LRG II (Orthopedic Implant) ×3 IMPLANT
KIT ROOM TURNOVER OR (KITS) ×3 IMPLANT
MANIFOLD NEPTUNE II (INSTRUMENTS) IMPLANT
NEEDLE 22X1 1/2 (OR ONLY) (NEEDLE) ×3 IMPLANT
NS IRRIG 1000ML POUR BTL (IV SOLUTION) ×3 IMPLANT
PACK ORTHO EXTREMITY (CUSTOM PROCEDURE TRAY) ×3 IMPLANT
PACK UNIVERSAL I (CUSTOM PROCEDURE TRAY) IMPLANT
PAD ARMBOARD 7.5X6 YLW CONV (MISCELLANEOUS) ×3 IMPLANT
PAD CAST 4YDX4 CTTN HI CHSV (CAST SUPPLIES) ×1 IMPLANT
PADDING CAST COTTON 4X4 STRL (CAST SUPPLIES) ×2
PADDING CAST COTTON 6X4 STRL (CAST SUPPLIES) ×3 IMPLANT
PENCIL BUTTON HOLSTER BLD 10FT (ELECTRODE) IMPLANT
PLATE MEDIAL HOLE 8 (Plate) ×3 IMPLANT
SCREW LOCK VA ST 2.7X32 (Screw) ×3 IMPLANT
SCREW LOCKING VA 2.7X38 (Screw) ×6 IMPLANT
SCREW LOCKING VA 2.7X40MM (Screw) ×3 IMPLANT
SCRUB BETADINE 4OZ XXX (MISCELLANEOUS) ×3 IMPLANT
SOLUTION BETADINE 4OZ (MISCELLANEOUS) ×3 IMPLANT
SPLINT PLASTER CAST XFAST 5X30 (CAST SUPPLIES) ×1 IMPLANT
SPLINT PLASTER XFAST SET 5X30 (CAST SUPPLIES) ×2
SPONGE LAP 18X18 X RAY DECT (DISPOSABLE) ×3 IMPLANT
STAPLER VISISTAT 35W (STAPLE) IMPLANT
STOCKINETTE IMPERVIOUS 9X36 MD (GAUZE/BANDAGES/DRESSINGS) IMPLANT
STOCKINETTE IMPERVIOUS LG (DRAPES) ×3 IMPLANT
SUCTION FRAZIER TIP 10 FR DISP (SUCTIONS) ×3 IMPLANT
SUT ETHILON 3 0 PS 1 (SUTURE) ×6 IMPLANT
SUT VIC AB 0 CT1 27 (SUTURE) ×4
SUT VIC AB 0 CT1 27XBRD ANBCTR (SUTURE) ×2 IMPLANT
SUT VIC AB 2-0 CT1 27 (SUTURE) ×2
SUT VIC AB 2-0 CT1 TAPERPNT 27 (SUTURE) ×1 IMPLANT
SYR BULB IRRIGATION 50ML (SYRINGE) ×3 IMPLANT
SYR CONTROL 10ML LL (SYRINGE) ×3 IMPLANT
TOWEL OR 17X24 6PK STRL BLUE (TOWEL DISPOSABLE) ×3 IMPLANT
TOWEL OR 17X26 10 PK STRL BLUE (TOWEL DISPOSABLE) ×3 IMPLANT
TUBE CONNECTING 12'X1/4 (SUCTIONS) ×1
TUBE CONNECTING 12X1/4 (SUCTIONS) ×2 IMPLANT
UNDERPAD 30X30 INCONTINENT (UNDERPADS AND DIAPERS) ×3 IMPLANT
WATER STERILE IRR 1000ML POUR (IV SOLUTION) IMPLANT
YANKAUER SUCT BULB TIP NO VENT (SUCTIONS) ×3 IMPLANT

## 2014-07-16 NOTE — Transfer of Care (Signed)
Immediate Anesthesia Transfer of Care Note  Patient: April [name]  Procedure(s) Performed: Procedure(s): RIGHT TIBIAL OSTEOTOMY (Right) HARDWARE REMOVAL (Right)  Patient Location: PACU  Anesthesia Type:General  Level of Consciousness: awake, alert  and oriented  Airway & Oxygen Therapy: Patient Spontanous Breathing and Patient connected to nasal cannula oxygen  Post-op Assessment: Report given to RN and Post -op Vital signs reviewed and stable  Post vital signs: Reviewed and stable  Last Vitals:  Filed Vitals:   07/16/14 1231  BP: 108/85  Pulse: 88  Temp:   Resp: 16    Complications: No apparent anesthesia complications

## 2014-07-16 NOTE — Anesthesia Postprocedure Evaluation (Signed)
Anesthesia Post Note  Patient: April [name]  Procedure(s) Performed: Procedure(s) (LRB): RIGHT TIBIAL OSTEOTOMY (Right) HARDWARE REMOVAL (Right)  Anesthesia type: General  Patient location: PACU  Post pain: Pain level controlled  Post assessment: Post-op Vital signs reviewed  Last Vitals: BP 98/53 mmHg  Pulse 78  Temp(Src) 36.2 C (Oral)  Resp 22  Ht 5\' 7"  (1.702 m)  Wt 127 lb (57.607 kg)  BMI 19.89 kg/m2  SpO2 100%  Post vital signs: Reviewed  Level of consciousness: sedated  Complications: No apparent anesthesia complications

## 2014-07-16 NOTE — H&P (Signed)
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 Patient had initially done very well and had just started to begin weightbearing activities about a month prior to her follow-up. This most recent follow-up on 06/24/2014 patient reported increased pain in her right leg along with neuropathic pain along her peroneal nerve distribution. She also developed varus angulation at her distal tibia. A CT scan was obtained which did confirm increased consolidation at her previous nonunion site. No fracture of her fibula was noted. Options were discussed with the patient and patient presents today for repair of her malunion.  Past Medical History  Diagnosis Date  . COPD (chronic obstructive pulmonary disease)   . Asthma   . Anxiety   . GERD (gastroesophageal reflux disease)     years ago  . History of hiatal hernia     years ago  . Headache     migraines   . Neuromuscular disorder     nerve damage in right leg  . Arthritis   . Cancer     uterine cancer, hysterectomy - ?2005  . Anemia     years ago  . Irritable bowel syndrome   . ADHD (attention deficit hyperactivity disorder)   . Family history of adverse reaction to anesthesia     " mother had difficulty waking "  "had alot of issues, including infection".  . Shortness of breath dyspnea   . Pneumonia 2012  . Depression   . Complication of anesthesia     woke up with an anxiety attack with last surgery (2013), pt. reports that she has high tolerance to medicine, also has woken during anesth.     Past Surgical History  Procedure Laterality Date  . Tonsillectomy    . Tubal ligation    . Bunionectomy Bilateral   . Esophagogastroduodenoscopy    . Fracture surgery      right tibia  . Dilation and curettage of uterus    . Wisdom tooth extraction    . Colonoscopy    . Orif  tibia fracture Right 04/02/2014    Procedure: OPEN REDUCTION INTERNAL FIXATION (ORIF) RIGHT DISTAL TIBIA FRACTURE;  Surgeon: Rozanna Box, MD;  Location: [facility];  Service: Orthopedics;  Laterality: Right;  . Hardware removal Right 04/02/2014    Procedure: AND HARDWARE REMOVAL ;  Surgeon: Rozanna Box, MD;  Location: [facility];  Service: Orthopedics;  Laterality: Right;  . Harvest bone graft Right 04/02/2014    Procedure:  AND ILIAC BONE GRAFT;  Surgeon: Rozanna Box, MD;  Location: [facility];  Service: Orthopedics;  Laterality: Right;  . Vaginal hysterectomy  2000    Family History  Problem Relation Age of Onset  . Lung cancer Mother   . Heart disease Father   . Alcoholism Father    Social History:  reports that she quit smoking about 7 months ago. Her smoking use included Cigarettes. She has never used smokeless tobacco. She reports that she drinks alcohol. She reports that she does not use illicit drugs.  Allergies: No Known Allergies  Medications Prior to Admission  Medication Sig Dispense Refill  . albuterol (PROVENTIL HFA;VENTOLIN HFA) 108 (90 BASE) MCG/ACT inhaler Inhale 2 puffs into the lungs every 6 (six) hours as needed for wheezing or shortness of breath.    . diazepam (VALIUM) 5 MG tablet Take 5 mg  by mouth 2 (two) times daily as needed for anxiety.    . gabapentin (NEURONTIN) 300 MG capsule Take 1 capsule (300 mg total) by mouth 2 (two) times daily. (Patient taking differently: Take 600 mg by mouth 3 (three) times daily. ) 60 capsule 1  . methocarbamol (ROBAXIN) 500 MG tablet Take 1-2 tablets (500-1,000 mg total) by mouth every 6 (six) hours as needed for muscle spasms. 80 tablet 1  . montelukast (SINGULAIR) 10 MG tablet Take 10 mg by mouth at bedtime.    Marland Kitchen oxyCODONE-acetaminophen (PERCOCET/ROXICET) 5-325 MG per tablet Take 1-2 tablets by mouth every 6 (six) hours as needed for moderate pain. 90 tablet 0  . promethazine (PHENERGAN) 25 MG tablet Take 25 mg by mouth every 6 (six)  hours.     Marland Kitchen aspirin EC 325 MG tablet Take 1 tablet (325 mg total) by mouth every 12 (twelve) hours. (Patient not taking: Reported on 07/11/2014) 60 tablet 0  . docusate sodium 100 MG CAPS Take 100 mg by mouth 2 (two) times daily. (Patient not taking: Reported on 07/11/2014) 10 capsule 0  . oxyCODONE (OXY IR/ROXICODONE) 5 MG immediate release tablet Take 1-2 tablets (5-10 mg total) by mouth every 6 (six) hours as needed for breakthrough pain (take between percocet doses for breakthrough pain). (Patient not taking: Reported on 07/11/2014) 70 tablet 0    Results for orders placed or performed during the hospital encounter of 07/16/14 (from the past 48 hour(s))  CBC     Status: None   Collection Time: 07/16/14  6:47 AM  Result Value Ref Range   WBC 7.4 4.0 - 10.5 K/uL   RBC 4.33 3.87 - 5.11 MIL/uL   Hemoglobin 13.1 12.0 - 15.0 g/dL   HCT 39.1 36.0 - 46.0 %   MCV 90.3 78.0 - 100.0 fL   MCH 30.3 26.0 - 34.0 pg   MCHC 33.5 30.0 - 36.0 g/dL   RDW 13.1 11.5 - 15.5 %   Platelets 291 150 - 400 K/uL   No results found.  Review of Systems  Constitutional: Negative for fever and chills.  Respiratory: Negative for shortness of breath and wheezing.   Cardiovascular: Negative for chest pain and palpitations.  Gastrointestinal: Negative for nausea, vomiting and abdominal pain.  Genitourinary: Negative for dysuria and urgency.  Musculoskeletal:       Right leg pain  Neurological: Positive for tingling (Right lower extremity) and sensory change (Right lower extremity).    Blood pressure 113/52, pulse 82, temperature 97.8 F (36.6 C), temperature source Oral, resp. rate 18, height 5\' 7"  (1.702 m), weight 57.607 kg (127 lb), SpO2 98 %.  Estimated body mass index is 19.89 kg/(m^2) as calculated from the following:   Height as of this encounter: 5\' 7"  (1.702 m).   Weight as of this encounter: 57.607 kg (127 lb).  Physical Exam  Constitutional: She is oriented to person, place, and time. She appears  well-developed and well-nourished.  HENT:  Head: Normocephalic and atraumatic.  Eyes: EOM are normal. Pupils are equal, round, and reactive to light.  Cardiovascular: Normal rate and regular rhythm.   Respiratory: Effort normal and breath sounds normal. She has no wheezes.  GI: Soft. She exhibits no distension.  Musculoskeletal:  Right lower extremity   Healed surgical   Tenderness along the fibula as well as distal tibia    Extremity is warm    + DP pulse    Excellent ankle range of motion with 20 of extension to 30 of  flexion. No significant swelling distally    DPN and TN sensory functions grossly intact    SPN sensory functions decreased slightly this is baseline for patient since we established care for her    EHL FHL, anterior tibialis, posterior tibialis, peroneals and gastrocsoleus complex motor     Neurological: She is alert and oriented to person, place, and time.  Skin: Skin is warm.     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 Would anticipate a 2 to three-day hospital stay postoperatively  Jari Pigg, PA-C [facility] [phone] (P) 07/16/2014, 7:22 AM

## 2014-07-16 NOTE — Brief Op Note (Signed)
07/16/2014  4:49 PM  PATIENT:  Celene Skeen  44 y.o. female  PRE-OPERATIVE DIAGNOSIS:  MALUNION RIGHT TIBIA  POST-OPERATIVE DIAGNOSIS:  MALUNION RIGHT TIBIA  PROCEDURE:  Procedure(s): 1. RIGHT TIBIAL OSTEOTOMY (Right) 2. HARDWARE REMOVAL (Right)  SURGEON:  Surgeon(s) and Role:    * Rozanna Box, MD - Primary  PHYSICIAN ASSISTANT: Ainsley Spinner, PA-C  ANESTHESIA:   general  I/O:  Total I/O In: 1444 [P.O.:240; I.V.:1000] Out: 875 [Urine:775; Blood:100]  SPECIMEN:  No Specimen  TOURNIQUET:  None  DICTATION: .Other Dictation: Dictation Number [id number]

## 2014-07-16 NOTE — Anesthesia Preprocedure Evaluation (Signed)
Anesthesia Evaluation  Patient identified by MRN, date of birth, ID band Patient awake    Reviewed: Allergy & Precautions, H&P , NPO status , Patient's Chart, lab work & pertinent test results  History of Anesthesia Complications (+) Emergence Delirium and Family history of anesthesia reaction  Airway Mallampati: I  TM Distance: >3 FB Neck ROM: Full    Dental no notable dental hx. (+) Teeth Intact, Dental Advisory Given   Pulmonary shortness of breath and with exertion, asthma , pneumonia -, resolved, COPD COPD inhaler, former smoker,  breath sounds clear to auscultation  Pulmonary exam normal       Cardiovascular negative cardio ROS  Rhythm:Regular Rate:Normal     Neuro/Psych  Headaches, PSYCHIATRIC DISORDERS Anxiety Depression    GI/Hepatic Neg liver ROS, hiatal hernia, GERD-  Medicated and Controlled,  Endo/Other  negative endocrine ROS  Renal/GU negative Renal ROS     Musculoskeletal  (+) Arthritis -, Osteoarthritis,    Abdominal   Peds  Hematology negative hematology ROS (+) anemia ,   Anesthesia Other Findings   Reproductive/Obstetrics                             Anesthesia Physical  Anesthesia Plan  ASA: II  Anesthesia Plan: General   Post-op Pain Management:    Induction: Intravenous  Airway Management Planned: Oral ETT  Additional Equipment:   Intra-op Plan:   Post-operative Plan: Extubation in OR  Informed Consent: I have reviewed the patients History and Physical, chart, labs and discussed the procedure including the risks, benefits and alternatives for the proposed anesthesia with the patient or authorized representative who has indicated his/her understanding and acceptance.   Dental advisory given  Plan Discussed with: CRNA  Anesthesia Plan Comments:         Anesthesia Quick Evaluation

## 2014-07-16 NOTE — Progress Notes (Signed)
Orthopedic Tech Progress Note Patient Details:  April [name] 1970/06/04 [id number] OHF applied to bed Patient ID: April [name], female   DOB: 1971/04/28, 44 y.o.   MRN: [id number]   Fenton Foy 07/16/2014, 5:00 PM

## 2014-07-16 NOTE — Anesthesia Procedure Notes (Addendum)
Anesthesia Regional Block:  Popliteal block  Pre-Anesthetic Checklist: ,, timeout performed, Correct Patient, Correct Site, Correct Laterality, Correct Procedure, Correct Position, site marked, Risks and benefits discussed,  Surgical consent,  Pre-op evaluation,  At surgeon's request and post-op pain management  Laterality: Right  Prep: chloraprep       Needles:  Injection technique: Single-shot  Needle Type: Stimiplex     Needle Length: 10cm 10 cm Needle Gauge: 21 and 21 G    Additional Needles:  Procedures: ultrasound guided (picture in chart) and nerve stimulator  Motor weakness within 5 minutes. Popliteal block Narrative:  Injection made incrementally with aspirations every 5 mL.  Performed by: Personally   Additional Notes: Good perineural spread.   Anesthesia Regional Block:  Adductor canal block  Pre-Anesthetic Checklist: ,, timeout performed, Correct Patient, Correct Site, Correct Laterality, Correct Procedure, Correct Position, site marked, Risks and benefits discussed,  Surgical consent,  Pre-op evaluation,  At surgeon's request and post-op pain management  Laterality: Right  Prep: Betadine       Needles:  Injection technique: Single-shot  Needle Type: Stimulator Needle - 80      Needle Gauge: 22 and 22 G  Needle insertion depth: 6 cm   Additional Needles:  Procedures: ultrasound guided (picture in chart) Adductor canal block  Nerve Stimulator or Paresthesia:  Response: Twitch elicited, 0.8 mA,   Additional Responses:   Narrative:  Injection made incrementally with aspirations every 5 mL.  Performed by: Personally  Anesthesiologist: Nolon Nations R  Additional Notes: Good perineural spread.

## 2014-07-17 DIAGNOSIS — S82301P Unspecified fracture of lower end of right tibia, subsequent encounter for closed fracture with malunion: Secondary | ICD-10-CM | POA: Diagnosis not present

## 2014-07-17 LAB — CBC
HCT: 34.1 % — ABNORMAL LOW (ref 36.0–46.0)
HEMOGLOBIN: 11.5 g/dL — AB (ref 12.0–15.0)
MCH: 30.5 pg (ref 26.0–34.0)
MCHC: 33.7 g/dL (ref 30.0–36.0)
MCV: 90.5 fL (ref 78.0–100.0)
Platelets: 261 10*3/uL (ref 150–400)
RBC: 3.77 MIL/uL — ABNORMAL LOW (ref 3.87–5.11)
RDW: 13 % (ref 11.5–15.5)
WBC: 8.8 10*3/uL (ref 4.0–10.5)

## 2014-07-17 LAB — BASIC METABOLIC PANEL
Anion gap: 4 — ABNORMAL LOW (ref 5–15)
BUN: <5 mg/dL — ABNORMAL LOW (ref 6–23)
CALCIUM: 8.3 mg/dL — AB (ref 8.4–10.5)
CHLORIDE: 107 mmol/L (ref 96–112)
CO2: 26 mmol/L (ref 19–32)
Creatinine, Ser: 0.54 mg/dL (ref 0.50–1.10)
GFR calc Af Amer: >90 mL/min (ref >90)
GFR calc non Af Amer: >90 mL/min (ref >90)
GLUCOSE: 117 mg/dL — AB (ref 70–99)
Potassium: 3.8 mmol/L (ref 3.5–5.1)
SODIUM: 137 mmol/L (ref 135–145)

## 2014-07-17 MED ORDER — PREGABALIN 100 MG PO CAPS
100.0000 mg | ORAL_CAPSULE | Freq: Three times a day (TID) | ORAL | Status: DC
Start: 2014-07-17 — End: 2014-07-18
  Administered 2014-07-17 – 2014-07-18 (×4): 100 mg via ORAL
  Filled 2014-07-17 (×4): qty 1

## 2014-07-17 MED ORDER — ALBUTEROL SULFATE HFA 108 (90 BASE) MCG/ACT IN AERS
2.0000 | INHALATION_SPRAY | Freq: Four times a day (QID) | RESPIRATORY_TRACT | Status: DC | PRN
  Filled 2014-07-17: qty 6.7

## 2014-07-17 MED ORDER — ALBUTEROL SULFATE (2.5 MG/3ML) 0.083% IN NEBU: 2.5000 mg | INHALATION_SOLUTION | Freq: Four times a day (QID) | RESPIRATORY_TRACT | Status: DC | PRN

## 2014-07-17 MED ORDER — OXYCODONE HCL 5 MG PO TABS
5.0000 mg | ORAL_TABLET | Freq: Four times a day (QID) | ORAL | Status: DC | PRN
  Administered 2014-07-17 – 2014-07-18 (×4): 5 mg via ORAL
  Administered 2014-07-18: 10 mg via ORAL
  Filled 2014-07-17 (×3): qty 1

## 2014-07-17 MED ORDER — METHOCARBAMOL 500 MG PO TABS
1000.0000 mg | ORAL_TABLET | Freq: Four times a day (QID) | ORAL | Status: DC
  Administered 2014-07-17 – 2014-07-18 (×4): 1000 mg via ORAL
  Filled 2014-07-17 (×4): qty 2

## 2014-07-17 MED ORDER — ALBUTEROL SULFATE (2.5 MG/3ML) 0.083% IN NEBU: 2.5000 mg | INHALATION_SOLUTION | Freq: Four times a day (QID) | RESPIRATORY_TRACT | Status: DC

## 2014-07-17 MED ORDER — MORPHINE SULFATE 2 MG/ML IJ SOLN
INTRAMUSCULAR | Status: AC
Start: 2014-07-17 — End: 2014-07-17
  Filled 2014-07-17: qty 1

## 2014-07-17 MED ORDER — OXYCODONE-ACETAMINOPHEN 5-325 MG PO TABS
1.0000 | ORAL_TABLET | Freq: Four times a day (QID) | ORAL | Status: DC | PRN
  Administered 2014-07-17 (×2): 2 via ORAL
  Administered 2014-07-17 – 2014-07-18 (×3): 1 via ORAL
  Filled 2014-07-17: qty 1
  Filled 2014-07-17 (×3): qty 2
  Filled 2014-07-17: qty 1

## 2014-07-17 MED ORDER — HYDROXYZINE HCL 25 MG PO TABS
50.0000 mg | ORAL_TABLET | Freq: Three times a day (TID) | ORAL | Status: DC
  Administered 2014-07-17 – 2014-07-18 (×4): 50 mg via ORAL
  Filled 2014-07-17 (×4): qty 2

## 2014-07-17 MED ORDER — MORPHINE SULFATE 2 MG/ML IJ SOLN
1.0000 mg | INTRAMUSCULAR | Status: DC | PRN
  Administered 2014-07-17 – 2014-07-18 (×9): 2 mg via INTRAVENOUS
  Filled 2014-07-17 (×8): qty 1

## 2014-07-17 NOTE — Evaluation (Signed)
Physical Therapy Evaluation and Discharge  Patient Details Name: April [name] MRN: [id number] DOB: 1971/02/18 Today's Date: 07/17/2014   History of Present Illness  pt is a 44 y.o. female adm due to R tibia nonunion s/p osteomyelitis . pt is s/p RIGHT TIBIAL OSTEOTOMY and hardware removal.   Clinical Impression  Patient evaluated by Physical Therapy with no further acute PT needs identified. All education has been completed and the patient has no further questions. Pt c/o 10/10 pain throughout session, RN made aware.  See below for any follow-up Physial Therapy or equipment needs. PT is signing off. Thank you for this referral.     Follow Up Recommendations No PT follow up;Supervision/Assistance - 24 hour    Equipment Recommendations  None recommended by PT    Recommendations for Other Services       Precautions / Restrictions Precautions Precautions: None Restrictions Weight Bearing Restrictions: Yes RLE Weight Bearing: Non weight bearing      Mobility  Bed Mobility Overal bed mobility: Independent                Transfers Overall transfer level: Modified independent Equipment used: Rolling walker (2 wheeled)             General transfer comment: no LOB noted  Ambulation/Gait Ambulation/Gait assistance: Supervision;Modified independent (Device/Increase time) Ambulation Distance (Feet): 40 Feet Assistive device: Rolling walker (2 wheeled) Gait Pattern/deviations: Step-to pattern Gait velocity: decr Gait velocity interpretation: Below normal speed for age/gender General Gait Details: pt demo good ability to balance and maintain NWB status on Rt LE   Stairs            Wheelchair Mobility    Modified Rankin (Stroke Patients Only)       Balance Overall balance assessment: No apparent balance deficits (not formally assessed)                                           Pertinent Vitals/Pain Pain Assessment: 0-10 Pain Score:  10-Worst pain ever Pain Location: Rt LE Pain Descriptors / Indicators: Burning;Constant;Grimacing;Guarding Pain Intervention(s): Limited activity within patient's tolerance;Monitored during session;Premedicated before session;Repositioned;Patient requesting pain meds-RN notified    Home Living Family/patient expects to be discharged to:: Private residence Living Arrangements: Alone Available Help at Discharge: Family;Available 24 hours/day Type of Home: House Home Access: Level entry     Home Layout: One level Home Equipment: Walker - 2 wheels;Crutches;Wheelchair - manual Additional Comments: daughter lives with her    Prior Function Level of Independence: Independent with assistive device(s)         Comments: Used Rw and/or crutches in the past, depending on the circumstances     Hand Dominance   Dominant Hand: Right    Extremity/Trunk Assessment   Upper Extremity Assessment: Overall WFL for tasks assessed           Lower Extremity Assessment: RLE deficits/detail RLE Deficits / Details: unable to assess ankle; quad and hip WFL    Cervical / Trunk Assessment: Normal  Communication   Communication: No difficulties  Cognition Arousal/Alertness: Awake/alert Behavior During Therapy: Impulsive;Anxious Overall Cognitive Status: Within Functional Limits for tasks assessed                      General Comments General comments (skin integrity, edema, etc.): encouraged OOB activity     Exercises  Assessment/Plan    PT Assessment Patent does not need any further PT services  PT Diagnosis Difficulty walking;Acute pain   PT Problem List    PT Treatment Interventions     PT Goals (Current goals can be found in the Care Plan section) Acute Rehab PT Goals Patient Stated Goal: to get this pain controlled PT Goal Formulation: All assessment and education complete, DC therapy    Frequency     Barriers to discharge        Co-evaluation                End of Session   Activity Tolerance: Patient limited by pain Patient left: in bed;with call bell/phone within reach;with nursing/sitter in room Nurse Communication: Mobility status;Weight bearing status    Functional Assessment Tool Used: clinical judgement  Functional Limitation: Mobility: Walking and moving around Mobility: Walking and Moving Around Current Status (Q7619): At least 1 percent but less than 20 percent impaired, limited or restricted Mobility: Walking and Moving Around Goal Status [phone]): At least 1 percent but less than 20 percent impaired, limited or restricted Mobility: Walking and Moving Around Discharge Status [phone]): At least 1 percent but less than 20 percent impaired, limited or restricted    Time: 1517-1529 PT Time Calculation (min) (ACUTE ONLY): 12 min   Charges:   PT Evaluation $Initial PT Evaluation Tier I: 1 Procedure     PT G Codes:   PT G-Codes **NOT FOR INPATIENT CLASS** Functional Assessment Tool Used: clinical judgement  Functional Limitation: Mobility: Walking and moving around Mobility: Walking and Moving Around Current Status (P8099): At least 1 percent but less than 20 percent impaired, limited or restricted Mobility: Walking and Moving Around Goal Status [phone]): At least 1 percent but less than 20 percent impaired, limited or restricted Mobility: Walking and Moving Around Discharge Status [phone]): At least 1 percent but less than 20 percent impaired, limited or restricted    Gustavus Bryant, [place] 07/17/2014, 3:39 PM

## 2014-07-17 NOTE — Progress Notes (Signed)
PT Cancellation Note  Patient Details Name: April [name] MRN: [id number] DOB: Nov 16, 1970   Cancelled Treatment:    Reason Eval/Treat Not Completed: Pain limiting ability to participate;Patient declined, no reason specified. Pt stating " i will not be working with any type of therapy today. i cried from 8pm last night to 7am this morning and i just go back in bed". Will re-attempt to see pt at next avialable time, pending her participation.    Elie Confer [place], [place] 07/17/2014, 10:53 AM

## 2014-07-17 NOTE — Op Note (Signed)
April [name], April [name]              ACCOUNT NO.:  [id number]  MEDICAL RECORD NO.:  [id number]  LOCATION:                                 FACILITY:  PHYSICIAN:  [name]. Marcelino Scot, M.D. DATE OF BIRTH:  Oct 07, 1970  DATE OF PROCEDURE:  07/16/2014 DATE OF DISCHARGE:  07/16/2014                              OPERATIVE REPORT   PREOPERATIVE DIAGNOSIS:  Malunion, right tibia.  POSTOPERATIVE DIAGNOSIS:  Malunion, right tibia.  PROCEDURES:  Right tibia osteotomy with correction of malunion on 2nd hardware removal.  SURGEON:  [name]. Marcelino Scot, M.D.  ASSISTANT:  Jari Pigg, PA-C.  ANESTHESIA:  General.  COMPLICATIONS:  None.  I/O:  1240 mL in, UOP 775.  EBL:  100 mL.  DISPOSITION:  PACU.  CONDITION:  Stable.  TOURNIQUET:  None.  BRIEF SUMMARY AND INDICATION FOR PROCEDURE:  Ms. Bridgers is a 44 year old female, who sustained a displaced distal tibia fracture treated with the intramedullary nailing that went on to nonunion.  The patient underwent repair of her nonunion with plate and iliac crest grafting. Unfortunately subsequent to healing, she began to experience progressive lateral-sided pain particularly in the area of the tension of the fibula from her varus malalignment.  I discussed with her the risks and benefits of corrective osteotomy, and she wished to proceed.  She recognized these risks to include  infection, nerve injury, vessel injury, DVT, PE, malunion, nonunion, symptomatic hardware, need for further surgery among others.  BRIEF SUMMARY OF PROCEDURE:  Ms. Hoppe was given preoperative antibiotics in the operating room where general anesthesia was induced. Her right lower extremity was prepped and draped in usual sterile fashion.  I began after a time-out by making extensile medial incision. Through this, we then went down and exposed and removed her hardware. There was complete union with no visible motion.  We then identified the apex of the deformity  radiographically and used an one-third tubular plate and guides in order to make a dome osteotomy using multiple passes with the 25 drill and then connecting these with the osteotome.  This enabled [place] to swing and correct her alignment but did result in some opening wedge characteristics along the medial side, there we did retain a medial connection.  I continued to shave the nonunion repaired callus and work to produce a tibial plafond subchondral surface that was perpendicular to the axis of weightbearing.  This caused [place] to except a slight translation and some valgus of the tibia.  Clinically this appeared to produce perfect alignment of the leg.  X-ray was used to confirm appropriate hardware placement, alignment at the osteotomy site, and the alignment of the articular surfaces with regard to the long bone.  I did have to do substantial bending of the tibial plate as well, which was a Synthes distal tibial locking ostial plate.  Once this was achieved, large infused sponge and allograft was packed into the nonunion site  anteroposteriorly, medially, and laterally without stripping the periosteum and rather using an opening in the bone from the osteotomy site.  Ainsley Spinner, PA-C did assist me throughout and assistant was absolutely necessary to control, create, and reduce the osteotomy.  Standard layered  closure was performed.  Ms. Finan was wakened from anesthesia and transported to PACU in stable condition.  PROGNOSIS:  Ms. Creegan will be nonweightbearing with unrestricted range of motion of the knee and ankle for the right leg.  We anticipate protected weightbearing in 6 weeks, and she will remain on Lyrica or Neurontin and will start Forteo in the next 2 weeks.     [name]. Marcelino Scot, M.D.     MHH/MEDQ  D:  07/16/2014  T:  07/17/2014  Job:  [id number]

## 2014-07-17 NOTE — Progress Notes (Signed)
Called into pt room because IV was beeping. It was beeping because the pt did not have the oxygen on to monitor her respirations while on the PCA pump. Pt states that she "wanted me to turn the pump off" and that she was "sick of it". I turned the PCA off at the pt request awaiting for MD to manage her pain

## 2014-07-17 NOTE — Progress Notes (Signed)
OT Cancellation Note  Patient Details Name: April [name] MRN: [id number] DOB: 1971/01/31   Cancelled Treatment:    Reason Eval/Treat Not Completed: OT screened, no needs identified, will sign off Pt has all DME and is able to complete self care with compensatory techniques. Pt will have assistance as needed. Pt appears primarily limited by pain. OT signing off.   [place], OTR/L  [phone] 07/17/2014 07/17/2014, 3:07 PM

## 2014-07-17 NOTE — Progress Notes (Signed)
Pt looks more comfortable, but still states pain is a 10/10. Per pt states that pain has eased off a little.

## 2014-07-17 NOTE — Progress Notes (Signed)
[facility] Progress Note  Subjective  Tearful Anxious States that she didn't sleep much last night Block wore off around 10pm   Review of Systems  Constitutional: Negative for fever and chills.  Respiratory: Negative for shortness of breath and wheezing.   Cardiovascular: Negative for chest pain and palpitations.  Gastrointestinal: Negative for nausea, vomiting and abdominal pain.  Musculoskeletal:       R leg pain   Neurological: Positive for tingling. Negative for headaches.     Objective   BP 119/55 mmHg  Pulse 85  Temp(Src) 98.6 F (37 C) (Oral)  Resp 19  Ht 5\' 7"  (1.702 m)  Wt 57.607 kg (127 lb)  BMI 19.89 kg/m2  SpO2 97%  Intake/Output      02/23 0701 - 02/24 0700 02/24 0701 - 02/25 0700   P.O. 240    I.V. (mL/kg) 1180.8 (20.5)    IV Piggyback 100    Total Intake(mL/kg) 1520.8 (26.4)    Urine (mL/kg/hr) 4225 (3.1)    Blood 100 (0.1)    Total Output 4325     Net -2804.2            Labs  Results for HALIEY, ROMBERG (MRN [id number]) as of 07/17/2014 09:01  Ref. Range 07/17/2014 05:00  Sodium Latest Range: 135-145 mmol/L 137  Potassium Latest Range: 3.5-5.1 mmol/L 3.8  Chloride Latest Range: 96-112 mmol/L 107  CO2 Latest Range: 19-32 mmol/L 26  BUN Latest Range: 6-23 mg/dL PENDING  Creatinine Latest Range: 0.50-1.10 mg/dL 0.54  Calcium Latest Range: 8.4-10.5 mg/dL 8.3 (L)  GFR calc non Af Amer Latest Range: >90 mL/min >90  GFR calc Af Amer Latest Range: >90 mL/min >90  Glucose Latest Range: 70-99 mg/dL 117 (H)  Anion gap Latest Range: 5-15  4 (L)  WBC Latest Range: 4.0-10.5 K/uL 8.8  RBC Latest Range: 3.87-5.11 MIL/uL 3.77 (L)  Hemoglobin Latest Range: 12.0-15.0 g/dL 11.5 (L)  HCT Latest Range: 36.0-46.0 % 34.1 (L)  MCV Latest Range: 78.0-100.0 fL 90.5  MCH Latest Range: 26.0-34.0 pg 30.5  MCHC Latest Range: 30.0-36.0 g/dL 33.7  RDW Latest Range: 11.5-15.5 % 13.0  Platelets Latest Range: 150-400 K/uL 261    Exam  Gen: awake,  in bed, anxious and tearful Lungs: clear B  Cardiac: s1 and s2, RRR  Abd: +BS, NTND Ext:       Right Lower Extremity   Splint split by myself, pt with relief   DPN, SPN, TN sensations grossly intact  Hyperesthesias along SPN distribution   EHL, FHL, lesser toe motor functions intact  Ext warm  + DP pulse   Swelling well controlled   Assessment and Plan   POD/HD#: 1   1. R tibia nonunion s/p osteomyelitis   NWB x 8 weeks  Splint split with good relief  Ice and elevate  PT/OT evals  Toe and knee motion as tolerated   2 Pain management:  Dc PCA  Percocet 10/325 1-2 po q6h prn  Oxy IR 5-10 mg po q3h prn breakthrough pain   Robaxin 1000 mg po q6h  Dc neurontin  lyrica 100 mg po q8h    3 ABL anemia/Hemodynamics  Stable  4 Medical issues   Home meds   5. DVT/PE prophylaxis:  Lovenox   6. ID:   Completed periop abx  7. Metabolic Bone Disease:  Disuse osteoporosis   Pt to start forteo at dc   8. Activity:  As tolerated while  Maintaining WB restrictions   9. FEN/Foley/Lines:  Diet as tolerated   10.Ex-fix/Splint care:  Cont with current splint    11. Dispo:  Optimize pain control  Possible dc home tomorrow    Jari Pigg, PA-C [facility] [phone] (O) 07/17/2014 8:59 AM

## 2014-07-17 NOTE — Progress Notes (Signed)
UR completed 

## 2014-07-18 DIAGNOSIS — S82301P Unspecified fracture of lower end of right tibia, subsequent encounter for closed fracture with malunion: Secondary | ICD-10-CM | POA: Diagnosis not present

## 2014-07-18 DIAGNOSIS — M818 Other osteoporosis without current pathological fracture: Secondary | ICD-10-CM | POA: Diagnosis present

## 2014-07-18 MED ORDER — PREGABALIN 100 MG PO CAPS: 100.0000 mg | ORAL_CAPSULE | Freq: Three times a day (TID) | ORAL | Status: DC

## 2014-07-18 MED ORDER — ENOXAPARIN SODIUM 40 MG/0.4ML ~~LOC~~ SOLN
40.0000 mg | SUBCUTANEOUS | Status: DC
  Administered 2014-07-18: 40 mg via SUBCUTANEOUS
  Filled 2014-07-18: qty 0.4

## 2014-07-18 MED ORDER — OXYCODONE HCL 5 MG PO TABS: 5.0000 mg | ORAL_TABLET | ORAL | Status: DC | PRN

## 2014-07-18 MED ORDER — OXYCODONE-ACETAMINOPHEN 10-325 MG PO TABS: 1.0000 | ORAL_TABLET | Freq: Four times a day (QID) | ORAL | Status: DC | PRN

## 2014-07-18 MED ORDER — ENOXAPARIN SODIUM 40 MG/0.4ML ~~LOC~~ SOLN
40.0000 mg | SUBCUTANEOUS | Status: DC
Start: 2014-07-18 — End: 2014-12-10

## 2014-07-18 MED ORDER — METHOCARBAMOL 500 MG PO TABS: 500.0000 mg | ORAL_TABLET | Freq: Four times a day (QID) | ORAL | Status: DC | PRN

## 2014-07-18 NOTE — Discharge Instructions (Signed)
[facility] Discharge Instructions   General Discharge Instructions  WEIGHT BEARING STATUS: Nonweight bearing Right Leg   RANGE OF MOTION/ACTIVITY: no ankle range of motion, ok to move toes and knee. Do not remove splint, do not get splint wet   Diet: as you were eating previously.  Can use over the counter stool softeners and bowel preparations, such as Miralax, to help with bowel movements.  Narcotics can be constipating.  Be sure to drink plenty of fluids  STOP SMOKING OR USING NICOTINE PRODUCTS!!!!  As discussed nicotine severely impairs your body's ability to heal surgical and traumatic wounds but also impairs bone healing.  Wounds and bone heal by forming microscopic blood vessels (angiogenesis) and nicotine is a vasoconstrictor (essentially, shrinks blood vessels).  Therefore, if vasoconstriction occurs to these microscopic blood vessels they essentially disappear and are unable to deliver necessary nutrients to the healing tissue.  This is one modifiable factor that you can do to dramatically increase your chances of healing your injury.    (This means no smoking, no nicotine gum, patches, etc)  DO NOT USE NONSTEROIDAL ANTI-INFLAMMATORY DRUGS (NSAID'S)  Using products such as Advil (ibuprofen), Aleve (naproxen), Motrin (ibuprofen) for additional pain control during fracture healing can delay and/or prevent the healing response.  If you would like to take over the counter (OTC) medication, Tylenol (acetaminophen) is ok.  However, some narcotic medications that are given for pain control contain acetaminophen as well. Therefore, you should not exceed more than 4000 mg of tylenol in a day if you do not have liver disease.  Also note that there are may OTC medicines, such as cold medicines and allergy medicines that my contain tylenol as well.  If you have any questions about medications and/or interactions please ask your doctor/PA or your pharmacist.   PAIN MEDICATION USE AND  EXPECTATIONS  You have likely been given narcotic medications to help control your pain.  After a traumatic event that results in an fracture (broken bone) with or without surgery, it is ok to use narcotic pain medications to help control one's pain.  We understand that everyone responds to pain differently and each individual patient will be evaluated on a regular basis for the continued need for narcotic medications. Ideally, narcotic medication use should last no more than 6-8 weeks (coinciding with fracture healing).   As a patient it is your responsibility as well to monitor narcotic medication use and report the amount and frequency you use these medications when you come to your office visit.   We would also advise that if you are using narcotic medications, you should take a dose prior to therapy to maximize you participation.  IF YOU ARE ON NARCOTIC MEDICATIONS IT IS NOT PERMISSIBLE TO OPERATE A MOTOR VEHICLE (MOTORCYCLE/CAR/TRUCK/MOPED) OR HEAVY MACHINERY DO NOT MIX NARCOTICS WITH OTHER CNS (CENTRAL NERVOUS SYSTEM) DEPRESSANTS SUCH AS ALCOHOL       ICE AND ELEVATE INJURED/OPERATIVE EXTREMITY  Using ice and elevating the injured extremity above your heart can help with swelling and pain control.  Icing in a pulsatile fashion, such as 20 minutes on and 20 minutes off, can be followed.    Do not place ice directly on skin. Make sure there is a barrier between to skin and the ice pack.    Using frozen items such as frozen peas works well as the conform nicely to the are that needs to be iced.  USE AN ACE WRAP OR TED HOSE FOR SWELLING CONTROL  In  addition to icing and elevation, Ace wraps or TED hose are used to help limit and resolve swelling.  It is recommended to use Ace wraps or TED hose until you are informed to stop.    When using Ace Wraps start the wrapping distally (farthest away from the body) and wrap proximally (closer to the body)   Example: If you had surgery on your leg or  thing and you do not have a splint on, start the ace wrap at the toes and work your way up to the thigh        If you had surgery on your upper extremity and do not have a splint on, start the ace wrap at your fingers and work your way up to the upper arm  IF YOU ARE IN A SPLINT OR CAST DO NOT [place]   If your splint gets wet for any reason please contact the office immediately. You may shower in your splint or cast as long as you keep it dry.  This can be done by wrapping in a cast cover or garbage back (or similar)  Do Not stick any thing down your splint or cast such as pencils, money, or hangers to try and scratch yourself with.  If you feel itchy take benadryl as prescribed on the bottle for itching  IF YOU ARE IN A CAM BOOT (BLACK BOOT)  You may remove boot periodically. Perform daily dressing changes as noted below.  Wash the liner of the boot regularly and wear a sock when wearing the boot. It is recommended that you sleep in the boot until told otherwise  [place] [facility]: [phone]

## 2014-07-18 NOTE — Discharge Summary (Signed)
[facility] (OTS)  Patient ID: April [name] MRN: [id number] DOB/AGE: 1970/10/06 44 y.o.  Admit date: 07/16/2014 Discharge date: 07/18/2014  Admission Diagnoses: Nonunion/malunion distal tibia, right Asthma GERD Anxiety  Disuse osteoporosis   Discharge Diagnoses:  Principal Problem:   nonunion tibia, right  Active Problems:   Asthma   Anxiety   GERD (gastroesophageal reflux disease)   Disuse osteoporosis   Procedures Performed: 07/16/2014- Dr. Marcelino Scot   1.Right tibia osteotomy with correction of malunion on 2nd hardware removal   Discharged Condition: good  Hospital Course:   44 y/o female well known to OTS for R distal tibia nonunion.  Pt had increasing pain in R leg with malunion of R distal tibia. Admitted for osteotomy of R tibia. Pt tolerated procedure well. Admitted for pain control and therapies. pts hospital stay was uncomplicated. Weaned off IV pain meds on POD 1, started on lovenox POD 1 as well. Worked with PT and OT on POD 1. On POD 2 pain was adequately controlled with PO meds and pt deemed stable for dc to home   Consults: None  Significant Diagnostic Studies: labs:   Results for April, [name] (MRN [id number]) as of 07/18/2014 09:40  Ref. Range 07/17/2014 05:00  Sodium Latest Range: 135-145 mmol/L 137  Potassium Latest Range: 3.5-5.1 mmol/L 3.8  Chloride Latest Range: 96-112 mmol/L 107  CO2 Latest Range: 19-32 mmol/L 26  BUN Latest Range: 6-23 mg/dL <5 (L)  Creatinine Latest Range: 0.50-1.10 mg/dL 0.54  Calcium Latest Range: 8.4-10.5 mg/dL 8.3 (L)  GFR calc non Af Amer Latest Range: >90 mL/min >90  GFR calc Af Amer Latest Range: >90 mL/min >90  Glucose Latest Range: 70-99 mg/dL 117 (H)  Anion gap Latest Range: 5-15  4 (L)  WBC Latest Range: 4.0-10.5 K/uL 8.8  RBC Latest Range: 3.87-5.11 MIL/uL 3.77 (L)  Hemoglobin Latest Range: 12.0-15.0 g/dL 11.5 (L)  HCT Latest Range: 36.0-46.0 % 34.1 (L)  MCV Latest Range: 78.0-100.0 fL 90.5   MCH Latest Range: 26.0-34.0 pg 30.5  MCHC Latest Range: 30.0-36.0 g/dL 33.7  RDW Latest Range: 11.5-15.5 % 13.0  Platelets Latest Range: 150-400 K/uL 261    Treatments: IV hydration, antibiotics: Ancef, analgesia: acetaminophen, Dilaudid, oxy IR and percocet, anticoagulation: LMW heparin, therapies: PT, OT and RN and surgery: as above   Discharge Exam:  [facility] Tra[facility] Service Progress Note  Subjective  Stable Demanding to go home   Nursing notes reviewed over night   Review of Systems  Constitutional: Negative for fever and chills.  Respiratory: Negative for shortness of breath and wheezing.   Cardiovascular: Negative for chest pain and palpitations.  Gastrointestinal: Negative for nausea and vomiting.     Objective   BP 109/56 mmHg  Pulse 94  Temp(Src) 99.7 F (37.6 C) (Oral)  Resp 18  Ht 5\' 7"  (1.702 m)  Wt 57.607 kg (127 lb)  BMI 19.89 kg/m2  SpO2 96%  Intake/Output       02/24 0701 - 02/25 0700 02/25 0701 - 02/26 0700    P.O. 1080     I.V. (mL/kg)      IV Piggyback      Total Intake(mL/kg) 1080 (18.7)     Urine (mL/kg/hr)      Blood      Total Output        Net +1080            Urine Occurrence 5 x     Stool Occurrence 0 x  Labs  Results for April, [name] (MRN [id number]) as of 07/18/2014 08:49   Ref. Range  07/17/2014 05:00   Sodium  Latest Range: 135-145 mmol/L  137   Potassium  Latest Range: 3.5-5.1 mmol/L  3.8   Chloride  Latest Range: 96-112 mmol/L  107   CO2  Latest Range: 19-32 mmol/L  26   BUN  Latest Range: 6-23 mg/dL  <5 (L)   Creatinine  Latest Range: 0.50-1.10 mg/dL  0.54   Calcium  Latest Range: 8.4-10.5 mg/dL  8.3 (L)   GFR calc non Af Amer  Latest Range: >90 mL/min  >90   GFR calc Af Amer  Latest Range: >90 mL/min  >90   Glucose  Latest Range: 70-99 mg/dL  117 (H)   Anion gap  Latest Range: 5-15   4 (L)   WBC  Latest Range: 4.0-10.5 K/uL  8.8   RBC  Latest Range: 3.87-5.11 MIL/uL  3.77 (L)   Hemoglobin  Latest Range:  12.0-15.0 g/dL  11.5 (L)   HCT  Latest Range: 36.0-46.0 %  34.1 (L)   MCV  Latest Range: 78.0-100.0 fL  90.5   MCH  Latest Range: 26.0-34.0 pg  30.5   MCHC  Latest Range: 30.0-36.0 g/dL  33.7   RDW  Latest Range: 11.5-15.5 %  13.0   Platelets  Latest Range: 150-400 K/uL  261     Exam  Gen: awake and alert, NAD Ext:   Right Lower Extremity               splint fitting well               DPN, SPN, TN sensations grossly intact             Hyperesthesias along SPN distribution               EHL, FHL, lesser toe motor functions intact             Ext warm             + DP pulse               Swelling well controlled    Assessment and Plan   POD/HD#: 2  44 y/o female with R distal tibia nonunion   1. R tibia nonunion s/p Osteotomy and ORIF               NWB x 8 weeks             continue with splint               Ice and elevate             PT/OT evals             Toe and knee motion as tolerated   2 Pain management:                      Percocet 10/325 1-2 po q6h prn             Oxy IR 5-10 mg po q3h prn breakthrough pain               Robaxin 1000 mg po q6h             lyrica 100 mg po q8h                3 ABL anemia/Hemodynamics  Stable  4 Medical issues               Home meds              5. DVT/PE prophylaxis:             Lovenox x 14 days   6. ID:               Completed periop abx  7. Metabolic Bone Disease:             Disuse osteoporosis                         Pt to start forteo at dc once received from specialty pharmacy   8. Activity:             As tolerated while  Maintaining WB restrictions   9. FEN/Foley/Lines:             Diet as tolerated               Dc IV and IVF  10.Ex-fix/Splint care:             Cont with current splint    11. Dispo:            dc home today             Follow up in 2 weeks at office     Jari Pigg, PA-C [facility] [phone] (O) 07/18/2014 8:48  AM   Disposition: 01-Home or Self Care      Discharge Instructions    Call MD / Call 911    Complete by:  As directed   If you experience chest pain or shortness of breath, CALL 911 and be transported to the hospital emergency room.  If you develope a fever above 101 F, pus (white drainage) or increased drainage or redness at the wound, or calf pain, call your surgeon's office.     Constipation Prevention    Complete by:  As directed   Drink plenty of fluids.  Prune juice may be helpful.  You may use a stool softener, such as Colace (over the counter) 100 mg twice a day.  Use MiraLax (over the counter) for constipation as needed.     Diet general    Complete by:  As directed      Discharge instructions    Complete by:  As directed   [facility] Discharge Instructions   General Discharge Instructions  WEIGHT BEARING STATUS: Nonweight bearing Right Leg   RANGE OF MOTION/ACTIVITY: no ankle range of motion, ok to move toes and knee. Do not remove splint, do not get splint wet   Diet: as you were eating previously.  Can use over the counter stool softeners and bowel preparations, such as Miralax, to help with bowel movements.  Narcotics can be constipating.  Be sure to drink plenty of fluids  STOP SMOKING OR USING NICOTINE PRODUCTS!!!!  As discussed nicotine severely impairs your body's ability to heal surgical and traumatic wounds but also impairs bone healing.  Wounds and bone heal by forming microscopic blood vessels (angiogenesis) and nicotine is a vasoconstrictor (essentially, shrinks blood vessels).  Therefore, if vasoconstriction occurs to these microscopic blood vessels they essentially disappear and are unable to deliver necessary nutrients to the healing tissue.  This is one modifiable factor that you can do to dramatically increase your chances of healing your injury.    (  This means no smoking, no nicotine gum, patches, etc)  DO NOT USE NONSTEROIDAL  ANTI-INFLAMMATORY DRUGS (NSAID'S)  Using products such as Advil (ibuprofen), Aleve (naproxen), Motrin (ibuprofen) for additional pain control during fracture healing can delay and/or prevent the healing response.  If you would like to take over the counter (OTC) medication, Tylenol (acetaminophen) is ok.  However, some narcotic medications that are given for pain control contain acetaminophen as well. Therefore, you should not exceed more than 4000 mg of tylenol in a day if you do not have liver disease.  Also note that there are may OTC medicines, such as cold medicines and allergy medicines that my contain tylenol as well.  If you have any questions about medications and/or interactions please ask your doctor/PA or your pharmacist.   PAIN MEDICATION USE AND EXPECTATIONS  You have likely been given narcotic medications to help control your pain.  After a traumatic event that results in an fracture (broken bone) with or without surgery, it is ok to use narcotic pain medications to help control one's pain.  We understand that everyone responds to pain differently and each individual patient will be evaluated on a regular basis for the continued need for narcotic medications. Ideally, narcotic medication use should last no more than 6-8 weeks (coinciding with fracture healing).   As a patient it is your responsibility as well to monitor narcotic medication use and report the amount and frequency you use these medications when you come to your office visit.   We would also advise that if you are using narcotic medications, you should take a dose prior to therapy to maximize you participation.  IF YOU ARE ON NARCOTIC MEDICATIONS IT IS NOT PERMISSIBLE TO OPERATE A MOTOR VEHICLE (MOTORCYCLE/CAR/TRUCK/MOPED) OR HEAVY MACHINERY DO NOT MIX NARCOTICS WITH OTHER CNS (CENTRAL NERVOUS SYSTEM) DEPRESSANTS SUCH AS ALCOHOL       ICE AND ELEVATE INJURED/OPERATIVE EXTREMITY  Using ice and elevating the injured  extremity above your heart can help with swelling and pain control.  Icing in a pulsatile fashion, such as 20 minutes on and 20 minutes off, can be followed.    Do not place ice directly on skin. Make sure there is a barrier between to skin and the ice pack.    Using frozen items such as frozen peas works well as the conform nicely to the are that needs to be iced.  USE AN ACE WRAP OR TED HOSE FOR SWELLING CONTROL  In addition to icing and elevation, Ace wraps or TED hose are used to help limit and resolve swelling.  It is recommended to use Ace wraps or TED hose until you are informed to stop.    When using Ace Wraps start the wrapping distally (farthest away from the body) and wrap proximally (closer to the body)   Example: If you had surgery on your leg or thing and you do not have a splint on, start the ace wrap at the toes and work your way up to the thigh        If you had surgery on your upper extremity and do not have a splint on, start the ace wrap at your fingers and work your way up to the upper arm  IF YOU ARE IN A SPLINT OR CAST DO NOT [place]   If your splint gets wet for any reason please contact the office immediately. You may shower in your splint or cast as long as you keep  it dry.  This can be done by wrapping in a cast cover or garbage back (or similar)  Do Not stick any thing down your splint or cast such as pencils, money, or hangers to try and scratch yourself with.  If you feel itchy take benadryl as prescribed on the bottle for itching  IF YOU ARE IN A CAM BOOT (BLACK BOOT)  You may remove boot periodically. Perform daily dressing changes as noted below.  Wash the liner of the boot regularly and wear a sock when wearing the boot. It is recommended that you sleep in the boot until told otherwise  CALL THE OFFICE WITH ANY QUESTIONS OR CONCERTS: [phone]     Driving restrictions    Complete by:  As directed   No driving     Increase activity slowly as  tolerated    Complete by:  As directed      Non weight bearing    Complete by:  As directed             Medication List    STOP taking these medications        aspirin EC 325 MG tablet     DSS 100 MG Caps     gabapentin 300 MG capsule  Commonly known as:  NEURONTIN     oxyCODONE-acetaminophen 5-325 MG per tablet  Commonly known as:  PERCOCET/ROXICET  Replaced by:  oxyCODONE-acetaminophen 10-325 MG per tablet     promethazine 25 MG tablet  Commonly known as:  PHENERGAN      TAKE these medications        albuterol 108 (90 BASE) MCG/ACT inhaler  Commonly known as:  PROVENTIL HFA;VENTOLIN HFA  Inhale 2 puffs into the lungs every 6 (six) hours as needed for wheezing or shortness of breath.     diazepam 5 MG tablet  Commonly known as:  VALIUM  Take 5 mg by mouth 2 (two) times daily as needed for anxiety.     enoxaparin 40 MG/0.4ML injection  Commonly known as:  LOVENOX  Inject 0.4 mLs (40 mg total) into the skin daily.     methocarbamol 500 MG tablet  Commonly known as:  ROBAXIN  Take 1-2 tablets (500-1,000 mg total) by mouth every 6 (six) hours as needed for muscle spasms.     montelukast 10 MG tablet  Commonly known as:  SINGULAIR  Take 10 mg by mouth at bedtime.     oxyCODONE 5 MG immediate release tablet  Commonly known as:  Oxy IR/ROXICODONE  Take 1-2 tablets (5-10 mg total) by mouth every 3 (three) hours as needed for breakthrough pain.     oxyCODONE-acetaminophen 10-325 MG per tablet  Commonly known as:  PERCOCET  Take 1-2 tablets by mouth every 6 (six) hours as needed for pain.     pregabalin 100 MG capsule  Commonly known as:  LYRICA  Take 1 capsule (100 mg total) by mouth 3 (three) times daily.       Follow-up Information    Follow up with HANDY,MICHAEL H, MD. Schedule an appointment as soon as possible for a visit in 14 days.   Specialty:  Orthopedic Surgery   Why:  For wound re-check   Contact information:   [address]  [place] [address] [phone]       Discharge Instructions and Plan: 1. R tibia nonunion s/p Osteotomy and ORIF               NWB  x 8 weeks             continue with splint               Ice and elevate             PT/OT evals             Toe and knee motion as tolerated   2 Pain management:                      Percocet 10/325 1-2 po q6h prn             Oxy IR 5-10 mg po q3h prn breakthrough pain               Robaxin 1000 mg po q6h             lyrica 100 mg po q8h                3. Medical issues               Home meds              4. DVT/PE prophylaxis:             Lovenox x 14 days   5. Metabolic Bone Disease:             Disuse osteoporosis                         Pt to start forteo at dc once received from specialty pharmacy   6. Activity:             As tolerated while  Maintaining WB restrictions    7. Dispo:                        Follow up in 2 weeks at office     Signed:  Jari Pigg, PA-C [facility] [phone] (P) 07/18/2014, 9:52 AM

## 2014-07-18 NOTE — Progress Notes (Signed)
Patient d/c to home.  IV removed, prescriptions given and insutrctions reviewed.

## 2014-07-18 NOTE — Progress Notes (Signed)
[facility]uma [facility] Progress Note  Subjective  Stable Demanding to go home  Nursing notes reviewed over night   Review of Systems  Constitutional: Negative for fever and chills.  Respiratory: Negative for shortness of breath and wheezing.   Cardiovascular: Negative for chest pain and palpitations.  Gastrointestinal: Negative for nausea and vomiting.     Objective   BP 109/56 mmHg  Pulse 94  Temp(Src) 99.7 F (37.6 C) (Oral)  Resp 18  Ht 5\' 7"  (1.702 m)  Wt 57.607 kg (127 lb)  BMI 19.89 kg/m2  SpO2 96%  Intake/Output      02/24 0701 - 02/25 0700 02/25 0701 - 02/26 0700   P.O. 1080    I.V. (mL/kg)     IV Piggyback     Total Intake(mL/kg) 1080 (18.7)    Urine (mL/kg/hr)     Blood     Total Output       Net +1080          Urine Occurrence 5 x    Stool Occurrence 0 x      Labs  Results for April [name], April [name] (MRN [id number]) as of 07/18/2014 08:49  Ref. Range 07/17/2014 05:00  Sodium Latest Range: 135-145 mmol/L 137  Potassium Latest Range: 3.5-5.1 mmol/L 3.8  Chloride Latest Range: 96-112 mmol/L 107  CO2 Latest Range: 19-32 mmol/L 26  BUN Latest Range: 6-23 mg/dL <5 (L)  Creatinine Latest Range: 0.50-1.10 mg/dL 0.54  Calcium Latest Range: 8.4-10.5 mg/dL 8.3 (L)  GFR calc non Af Amer Latest Range: >90 mL/min >90  GFR calc Af Amer Latest Range: >90 mL/min >90  Glucose Latest Range: 70-99 mg/dL 117 (H)  Anion gap Latest Range: 5-15  4 (L)  WBC Latest Range: 4.0-10.5 K/uL 8.8  RBC Latest Range: 3.87-5.11 MIL/uL 3.77 (L)  Hemoglobin Latest Range: 12.0-15.0 g/dL 11.5 (L)  HCT Latest Range: 36.0-46.0 % 34.1 (L)  MCV Latest Range: 78.0-100.0 fL 90.5  MCH Latest Range: 26.0-34.0 pg 30.5  MCHC Latest Range: 30.0-36.0 g/dL 33.7  RDW Latest Range: 11.5-15.5 % 13.0  Platelets Latest Range: 150-400 K/uL 261    Exam  Gen: awake and alert, NAD Ext:  Right Lower Extremity               splint fitting well              DPN, SPN, TN sensations grossly intact           Hyperesthesias along SPN distribution               EHL, FHL, lesser toe motor functions intact             Ext warm             + DP pulse               Swelling well controlled    Assessment and Plan   POD/HD#: 2  44 y/o female with R distal tibia nonunion   1. R tibia nonunion s/p Osteotomy and ORIF              NWB x 8 weeks             continue with splint              Ice and elevate             PT/OT evals             Toe and knee motion as tolerated  2 Pain management:                      Percocet 10/325 1-2 po q6h prn             Oxy IR 5-10 mg po q3h prn breakthrough pain               Robaxin 1000 mg po q6h             lyrica 100 mg po q8h                3 ABL anemia/Hemodynamics             Stable  4 Medical issues               Home meds              5. DVT/PE prophylaxis:             Lovenox x 14 days   6. ID:               Completed periop abx  7. Metabolic Bone Disease:             Disuse osteoporosis                         Pt to start forteo at dc once received from specialty pharmacy   8. Activity:             As tolerated while  Maintaining WB restrictions   9. FEN/Foley/Lines:             Diet as tolerated   Dc IV and IVF  10.Ex-fix/Splint care:             Cont with current splint    11. Dispo:            dc home today  Follow up in 2 weeks at office    Jari Pigg, PA-C [facility] [phone] (O) 07/18/2014 8:48 AM

## 2014-07-18 NOTE — Progress Notes (Signed)
Patient tearful. States that pain has been uncontrolled for 4 days and that she has been unable to sleep at night because of it. Patient also complains of back pain. RN assessed patients back skin is intact and warm to the touch. There is no break in the skin and there is no redness to the skin. RN encouraged the patient to get out of bed as much as possible and up to the chair with assistance and supervision in order to decrease pressure on her back. Patient thanked Therapist, sports. Nursing will continue to monitor pain management throughout the night.

## 2014-07-19 ENCOUNTER — Encounter (HOSPITAL_COMMUNITY): Payer: Self-pay | Admitting: Orthopedic Surgery

## 2014-11-08 ENCOUNTER — Ambulatory Visit
Admission: RE | Admit: 2014-11-08 | Discharge: 2014-11-08 | Disposition: A | Discharge status: Home / Self Care | Payer: Medicaid Other | Source: Ambulatory Visit | Attending: Orthopedic Surgery | Admitting: Orthopedic Surgery

## 2014-11-08 ENCOUNTER — Other Ambulatory Visit: Payer: Self-pay | Admitting: Orthopedic Surgery

## 2014-11-08 DIAGNOSIS — S82241K Displaced spiral fracture of shaft of right tibia, subsequent encounter for closed fracture with nonunion: Secondary | ICD-10-CM

## 2014-12-04 ENCOUNTER — Encounter (HOSPITAL_COMMUNITY): Payer: Self-pay | Admitting: *Deleted

## 2014-12-04 MED ORDER — CEFAZOLIN SODIUM-DEXTROSE 2-3 GM-% IV SOLR
2.0000 g | INTRAVENOUS | Status: AC
  Administered 2014-12-05 (×2): 2 g via INTRAVENOUS
  Filled 2014-12-04: qty 50

## 2014-12-04 MED ORDER — LACTATED RINGERS IV SOLN
INTRAVENOUS | Status: DC
  Administered 2014-12-05 (×4): via INTRAVENOUS

## 2014-12-04 NOTE — Progress Notes (Signed)
Pt denies SOB, chest pain, and being under the care of a cardiologist. Pt denies having a stress test, echo and cardiac cath. Pt denies having an EKG and chest x ray within the last year. Pt made aware to stop taking  Aspirin, otc vitamins and herbal medications. Do not take any NSAIDs ie: Ibuprofen, Advil, Naproxen or any medication containing Aspirin. Pt verbalized understanding of all pre-op instructions.

## 2014-12-04 NOTE — Anesthesia Preprocedure Evaluation (Addendum)
Anesthesia Evaluation  Patient identified by MRN, date of birth, ID band Patient awake    Reviewed: Allergy & Precautions, H&P , NPO status , Patient's Chart, lab work & pertinent test results  History of Anesthesia Complications (+) Emergence Delirium  Airway Mallampati: I  TM Distance: >3 FB Neck ROM: Full    Dental no notable dental hx. (+) Teeth Intact, Dental Advisory Given   Pulmonary shortness of breath and with exertion, asthma , COPD (no inhaler needed in months) COPD inhaler, former smoker,  breath sounds clear to auscultation        Cardiovascular negative cardio ROS  Rhythm:Regular Rate:Normal     Neuro/Psych  Headaches, PSYCHIATRIC DISORDERS (ADHD) Anxiety Depression RSD of R lower extremity    GI/Hepatic Neg liver ROS, hiatal hernia, GERD-  Medicated and Controlled,  Endo/Other  negative endocrine ROS  Renal/GU negative Renal ROS     Musculoskeletal  (+) Arthritis -, Osteoarthritis,    Abdominal   Peds  Hematology negative hematology ROS (+) anemia ,   Anesthesia Other Findings   Reproductive/Obstetrics                          Anesthesia Physical  Anesthesia Plan  ASA: III  Anesthesia Plan: General and Regional   Post-op Pain Management:    Induction: Intravenous  Airway Management Planned: Oral ETT  Additional Equipment:   Intra-op Plan:   Post-operative Plan: Extubation in OR  Informed Consent: I have reviewed the patients History and Physical, chart, labs and discussed the procedure including the risks, benefits and alternatives for the proposed anesthesia with the patient or authorized representative who has indicated his/her understanding and acceptance.   Dental advisory given  Plan Discussed with: CRNA and Surgeon  Anesthesia Plan Comments: (Plan routine monitors, GETA with femoral and popliteal nerve blocks for post op analgesia)      Anesthesia  Quick Evaluation

## 2014-12-05 ENCOUNTER — Inpatient Hospital Stay (HOSPITAL_COMMUNITY): Payer: Medicaid Other | Admitting: Anesthesiology

## 2014-12-05 ENCOUNTER — Encounter (HOSPITAL_COMMUNITY): Payer: Self-pay | Admitting: *Deleted

## 2014-12-05 ENCOUNTER — Encounter (HOSPITAL_COMMUNITY)
Admission: RE | Disposition: A | Discharge status: Home / Self Care | Payer: Self-pay | Source: Ambulatory Visit | Attending: Orthopedic Surgery

## 2014-12-05 ENCOUNTER — Inpatient Hospital Stay (HOSPITAL_COMMUNITY): Payer: Medicaid Other

## 2014-12-05 ENCOUNTER — Inpatient Hospital Stay (HOSPITAL_COMMUNITY)
Admission: RE | Admit: 2014-12-05 | Discharge: 2014-12-10 | DRG: 493 | Disposition: A | Discharge status: Home / Self Care | Payer: Medicaid Other | Source: Ambulatory Visit | Attending: Orthopedic Surgery | Admitting: Orthopedic Surgery

## 2014-12-05 DIAGNOSIS — Z7901 Long term (current) use of anticoagulants: Secondary | ICD-10-CM | POA: Diagnosis not present

## 2014-12-05 DIAGNOSIS — M792 Neuralgia and neuritis, unspecified: Secondary | ICD-10-CM | POA: Diagnosis present

## 2014-12-05 DIAGNOSIS — G8929 Other chronic pain: Secondary | ICD-10-CM | POA: Diagnosis present

## 2014-12-05 DIAGNOSIS — M818 Other osteoporosis without current pathological fracture: Secondary | ICD-10-CM | POA: Diagnosis present

## 2014-12-05 DIAGNOSIS — Z87891 Personal history of nicotine dependence: Secondary | ICD-10-CM

## 2014-12-05 DIAGNOSIS — M898X9 Other specified disorders of bone, unspecified site: Secondary | ICD-10-CM | POA: Diagnosis present

## 2014-12-05 DIAGNOSIS — F419 Anxiety disorder, unspecified: Secondary | ICD-10-CM | POA: Diagnosis present

## 2014-12-05 DIAGNOSIS — D62 Acute posthemorrhagic anemia: Secondary | ICD-10-CM | POA: Diagnosis not present

## 2014-12-05 DIAGNOSIS — Z79899 Other long term (current) drug therapy: Secondary | ICD-10-CM

## 2014-12-05 DIAGNOSIS — T84196A Other mechanical complication of internal fixation device of bone of right lower leg, initial encounter: Principal | ICD-10-CM | POA: Diagnosis present

## 2014-12-05 DIAGNOSIS — S82201K Unspecified fracture of shaft of right tibia, subsequent encounter for closed fracture with nonunion: Secondary | ICD-10-CM

## 2014-12-05 DIAGNOSIS — Z8542 Personal history of malignant neoplasm of other parts of uterus: Secondary | ICD-10-CM | POA: Diagnosis not present

## 2014-12-05 DIAGNOSIS — Y831 Surgical operation with implant of artificial internal device as the cause of abnormal reaction of the patient, or of later complication, without mention of misadventure at the time of the procedure: Secondary | ICD-10-CM | POA: Diagnosis present

## 2014-12-05 DIAGNOSIS — S82209K Unspecified fracture of shaft of unspecified tibia, subsequent encounter for closed fracture with nonunion: Secondary | ICD-10-CM

## 2014-12-05 DIAGNOSIS — J45909 Unspecified asthma, uncomplicated: Secondary | ICD-10-CM | POA: Diagnosis present

## 2014-12-05 DIAGNOSIS — Z419 Encounter for procedure for purposes other than remedying health state, unspecified: Secondary | ICD-10-CM

## 2014-12-05 DIAGNOSIS — S82301K Unspecified fracture of lower end of right tibia, subsequent encounter for closed fracture with nonunion: Secondary | ICD-10-CM | POA: Diagnosis present

## 2014-12-05 DIAGNOSIS — S82209A Unspecified fracture of shaft of unspecified tibia, initial encounter for closed fracture: Secondary | ICD-10-CM | POA: Diagnosis present

## 2014-12-05 DIAGNOSIS — G90521 Complex regional pain syndrome I of right lower limb: Secondary | ICD-10-CM | POA: Diagnosis present

## 2014-12-05 DIAGNOSIS — K219 Gastro-esophageal reflux disease without esophagitis: Secondary | ICD-10-CM | POA: Diagnosis present

## 2014-12-05 DIAGNOSIS — J449 Chronic obstructive pulmonary disease, unspecified: Secondary | ICD-10-CM | POA: Diagnosis present

## 2014-12-05 HISTORY — PX: HARDWARE REMOVAL: SHX979

## 2014-12-05 HISTORY — DX: Other chronic pain: G89.29

## 2014-12-05 HISTORY — PX: TIBIA IM NAIL INSERTION: SHX2516

## 2014-12-05 HISTORY — DX: Neuralgia and neuritis, unspecified: M79.2

## 2014-12-05 LAB — PROTIME-INR
INR: 1.03 (ref 0.00–1.49)
Prothrombin Time: 13.8 seconds (ref 11.6–15.2)

## 2014-12-05 LAB — URINALYSIS, ROUTINE W REFLEX MICROSCOPIC
Bilirubin Urine: NEGATIVE
GLUCOSE, UA: NEGATIVE mg/dL
Hgb urine dipstick: NEGATIVE
Ketones, ur: NEGATIVE mg/dL
Leukocytes, UA: NEGATIVE
NITRITE: NEGATIVE
Protein, ur: NEGATIVE mg/dL
Specific Gravity, Urine: 1.005 (ref 1.005–1.030)
Urobilinogen, UA: 0.2 mg/dL (ref 0.0–1.0)
pH: 5.5 (ref 5.0–8.0)

## 2014-12-05 LAB — APTT: APTT: 35 s (ref 24–37)

## 2014-12-05 LAB — COMPREHENSIVE METABOLIC PANEL
ALK PHOS: 144 U/L — AB (ref 38–126)
ALT: 13 U/L — ABNORMAL LOW (ref 14–54)
AST: 15 U/L (ref 15–41)
Albumin: 3.4 g/dL — ABNORMAL LOW (ref 3.5–5.0)
Anion gap: 12 (ref 5–15)
BUN: 7 mg/dL (ref 6–20)
CO2: 20 mmol/L — ABNORMAL LOW (ref 22–32)
Calcium: 9 mg/dL (ref 8.9–10.3)
Chloride: 103 mmol/L (ref 101–111)
Creatinine, Ser: 0.55 mg/dL (ref 0.44–1.00)
GFR calc Af Amer: >60 mL/min (ref >60)
GFR calc non Af Amer: >60 mL/min (ref >60)
GLUCOSE: 105 mg/dL — AB (ref 65–99)
Potassium: 3.6 mmol/L (ref 3.5–5.1)
Sodium: 135 mmol/L (ref 135–145)
TOTAL PROTEIN: 7.1 g/dL (ref 6.5–8.1)
Total Bilirubin: 0.4 mg/dL (ref 0.3–1.2)

## 2014-12-05 LAB — CBC WITH DIFFERENTIAL/PLATELET
BASOS ABS: 0 10*3/uL (ref 0.0–0.1)
Basophils Relative: 0 % (ref 0–1)
Eosinophils Absolute: 0.3 10*3/uL (ref 0.0–0.7)
Eosinophils Relative: 2 % (ref 0–5)
HEMATOCRIT: 37 % (ref 36.0–46.0)
Hemoglobin: 12.9 g/dL (ref 12.0–15.0)
Lymphocytes Relative: 20 % (ref 12–46)
Lymphs Abs: 2.4 10*3/uL (ref 0.7–4.0)
MCH: 31.4 pg (ref 26.0–34.0)
MCHC: 34.9 g/dL (ref 30.0–36.0)
MCV: 90 fL (ref 78.0–100.0)
MONO ABS: 0.8 10*3/uL (ref 0.1–1.0)
MONOS PCT: 6 % (ref 3–12)
NEUTROS PCT: 72 % (ref 43–77)
Neutro Abs: 8.7 10*3/uL — ABNORMAL HIGH (ref 1.7–7.7)
Platelets: 428 10*3/uL — ABNORMAL HIGH (ref 150–400)
RBC: 4.11 MIL/uL (ref 3.87–5.11)
RDW: 13.5 % (ref 11.5–15.5)
WBC: 12.2 10*3/uL — ABNORMAL HIGH (ref 4.0–10.5)

## 2014-12-05 LAB — C-REACTIVE PROTEIN: CRP: 6.7 mg/dL — AB (ref <1.0)

## 2014-12-05 LAB — SEDIMENTATION RATE: SED RATE: 40 mm/h — AB (ref 0–22)

## 2014-12-05 SURGERY — INSERTION, INTRAMEDULLARY ROD, TIBIA
Anesthesia: Regional | Site: Leg Lower | Laterality: Right

## 2014-12-05 MED ORDER — PROPOFOL 10 MG/ML IV BOLUS
INTRAVENOUS | Status: AC
  Filled 2014-12-05: qty 20

## 2014-12-05 MED ORDER — PROPOFOL 10 MG/ML IV BOLUS
INTRAVENOUS | Status: DC | PRN
  Administered 2014-12-05: 150 mg via INTRAVENOUS

## 2014-12-05 MED ORDER — NALOXONE HCL 0.4 MG/ML IJ SOLN: 0.4000 mg | INTRAMUSCULAR | Status: DC | PRN

## 2014-12-05 MED ORDER — ACETAMINOPHEN 325 MG PO TABS
650.0000 mg | ORAL_TABLET | Freq: Four times a day (QID) | ORAL | Status: DC | PRN
  Administered 2014-12-07 – 2014-12-08 (×3): 650 mg via ORAL
  Filled 2014-12-05 (×3): qty 2

## 2014-12-05 MED ORDER — DIPHENHYDRAMINE HCL 12.5 MG/5ML PO ELIX: 12.5000 mg | ORAL_SOLUTION | Freq: Four times a day (QID) | ORAL | Status: DC | PRN

## 2014-12-05 MED ORDER — CEFAZOLIN SODIUM 1-5 GM-% IV SOLN
1.0000 g | Freq: Four times a day (QID) | INTRAVENOUS | Status: AC
  Administered 2014-12-05 – 2014-12-06 (×3): 1 g via INTRAVENOUS
  Filled 2014-12-05 (×3): qty 50

## 2014-12-05 MED ORDER — MIDAZOLAM HCL 2 MG/2ML IJ SOLN
INTRAMUSCULAR | Status: AC
Start: 2014-12-05 — End: 2014-12-05
  Filled 2014-12-05: qty 2

## 2014-12-05 MED ORDER — ENOXAPARIN SODIUM 40 MG/0.4ML ~~LOC~~ SOLN
40.0000 mg | SUBCUTANEOUS | Status: DC
  Administered 2014-12-06 – 2014-12-10 (×5): 40 mg via SUBCUTANEOUS
  Filled 2014-12-05 (×5): qty 0.4

## 2014-12-05 MED ORDER — OXYCODONE HCL 5 MG PO TABS
5.0000 mg | ORAL_TABLET | ORAL | Status: DC | PRN
Start: 2014-12-05 — End: 2014-12-06
  Administered 2014-12-05: 10 mg via ORAL
  Administered 2014-12-06: 15 mg via ORAL
  Filled 2014-12-05: qty 3

## 2014-12-05 MED ORDER — MONTELUKAST SODIUM 10 MG PO TABS
10.0000 mg | ORAL_TABLET | Freq: Every day | ORAL | Status: DC
  Administered 2014-12-05 – 2014-12-09 (×5): 10 mg via ORAL
  Filled 2014-12-05 (×5): qty 1

## 2014-12-05 MED ORDER — ONDANSETRON HCL 4 MG/2ML IJ SOLN
INTRAMUSCULAR | Status: DC | PRN
  Administered 2014-12-05: 4 mg via INTRAVENOUS
  Administered 2014-12-05: 8 mg via INTRAVENOUS

## 2014-12-05 MED ORDER — SODIUM CHLORIDE 0.9 % IJ SOLN: 9.0000 mL | INTRAMUSCULAR | Status: DC | PRN

## 2014-12-05 MED ORDER — ACETAMINOPHEN 650 MG RE SUPP: 650.0000 mg | Freq: Four times a day (QID) | RECTAL | Status: DC | PRN

## 2014-12-05 MED ORDER — ROCURONIUM BROMIDE 50 MG/5ML IV SOLN
INTRAVENOUS | Status: AC
  Filled 2014-12-05: qty 1

## 2014-12-05 MED ORDER — 0.9 % SODIUM CHLORIDE (POUR BTL) OPTIME
TOPICAL | Status: DC | PRN
  Administered 2014-12-05: 1000 mL

## 2014-12-05 MED ORDER — LIDOCAINE HCL (CARDIAC) 20 MG/ML IV SOLN
INTRAVENOUS | Status: DC | PRN
  Administered 2014-12-05: 50 mg via INTRAVENOUS

## 2014-12-05 MED ORDER — OXYCODONE-ACETAMINOPHEN 5-325 MG PO TABS: 1.0000 | ORAL_TABLET | Freq: Four times a day (QID) | ORAL | Status: DC | PRN

## 2014-12-05 MED ORDER — HYDROMORPHONE HCL 1 MG/ML IJ SOLN
INTRAMUSCULAR | Status: AC
  Filled 2014-12-05: qty 1

## 2014-12-05 MED ORDER — DEXMEDETOMIDINE HCL IN NACL 200 MCG/50ML IV SOLN
INTRAVENOUS | Status: AC
  Filled 2014-12-05: qty 50

## 2014-12-05 MED ORDER — METHOCARBAMOL 1000 MG/10ML IJ SOLN
500.0000 mg | Freq: Four times a day (QID) | INTRAVENOUS | Status: DC | PRN
  Filled 2014-12-05 (×2): qty 5

## 2014-12-05 MED ORDER — METHOCARBAMOL 1000 MG/10ML IJ SOLN
500.0000 mg | INTRAVENOUS | Status: DC
  Filled 2014-12-05: qty 5

## 2014-12-05 MED ORDER — HYDROMORPHONE HCL 1 MG/ML IJ SOLN
INTRAMUSCULAR | Status: AC
  Filled 2014-12-05: qty 2

## 2014-12-05 MED ORDER — DIPHENHYDRAMINE HCL 50 MG/ML IJ SOLN: 12.5000 mg | Freq: Four times a day (QID) | INTRAMUSCULAR | Status: DC | PRN

## 2014-12-05 MED ORDER — ONDANSETRON HCL 4 MG PO TABS: 4.0000 mg | ORAL_TABLET | Freq: Four times a day (QID) | ORAL | Status: DC | PRN

## 2014-12-05 MED ORDER — PROMETHAZINE HCL 25 MG/ML IJ SOLN: 6.2500 mg | INTRAMUSCULAR | Status: DC | PRN

## 2014-12-05 MED ORDER — DIPHENHYDRAMINE HCL 12.5 MG/5ML PO ELIX: 12.5000 mg | ORAL_SOLUTION | ORAL | Status: DC | PRN

## 2014-12-05 MED ORDER — FENTANYL CITRATE (PF) 100 MCG/2ML IJ SOLN
100.0000 ug | Freq: Once | INTRAMUSCULAR | Status: AC
  Administered 2014-12-05: 100 ug via INTRAVENOUS

## 2014-12-05 MED ORDER — FENTANYL CITRATE (PF) 100 MCG/2ML IJ SOLN
INTRAMUSCULAR | Status: DC | PRN
  Administered 2014-12-05 (×2): 50 ug via INTRAVENOUS
  Administered 2014-12-05: 150 ug via INTRAVENOUS

## 2014-12-05 MED ORDER — ONDANSETRON HCL 4 MG/2ML IJ SOLN
INTRAMUSCULAR | Status: AC
  Filled 2014-12-05: qty 2

## 2014-12-05 MED ORDER — METOCLOPRAMIDE HCL 5 MG/ML IJ SOLN: 5.0000 mg | Freq: Three times a day (TID) | INTRAMUSCULAR | Status: DC | PRN

## 2014-12-05 MED ORDER — METHOCARBAMOL 500 MG PO TABS
ORAL_TABLET | ORAL | Status: AC
  Filled 2014-12-05: qty 1

## 2014-12-05 MED ORDER — DOCUSATE SODIUM 100 MG PO CAPS
100.0000 mg | ORAL_CAPSULE | Freq: Two times a day (BID) | ORAL | Status: DC
  Administered 2014-12-05 – 2014-12-09 (×9): 100 mg via ORAL
  Filled 2014-12-05 (×10): qty 1

## 2014-12-05 MED ORDER — MEPERIDINE HCL 25 MG/ML IJ SOLN: 6.2500 mg | INTRAMUSCULAR | Status: DC | PRN

## 2014-12-05 MED ORDER — ROCURONIUM BROMIDE 100 MG/10ML IV SOLN
INTRAVENOUS | Status: DC | PRN
  Administered 2014-12-05: 10 mg via INTRAVENOUS
  Administered 2014-12-05: 40 mg via INTRAVENOUS

## 2014-12-05 MED ORDER — EPHEDRINE SULFATE 50 MG/ML IJ SOLN
INTRAMUSCULAR | Status: DC | PRN
  Administered 2014-12-05: 10 mg via INTRAVENOUS

## 2014-12-05 MED ORDER — ONDANSETRON HCL 4 MG/2ML IJ SOLN: 4.0000 mg | Freq: Four times a day (QID) | INTRAMUSCULAR | Status: DC | PRN

## 2014-12-05 MED ORDER — METHOCARBAMOL 500 MG PO TABS
500.0000 mg | ORAL_TABLET | Freq: Four times a day (QID) | ORAL | Status: DC | PRN
Start: 2014-12-05 — End: 2014-12-10
  Administered 2014-12-05: 500 mg via ORAL
  Administered 2014-12-06 – 2014-12-10 (×16): 1000 mg via ORAL
  Filled 2014-12-05 (×16): qty 2

## 2014-12-05 MED ORDER — MIDAZOLAM HCL 2 MG/2ML IJ SOLN: 0.5000 mg | Freq: Once | INTRAMUSCULAR | Status: DC | PRN

## 2014-12-05 MED ORDER — OXYCODONE HCL 5 MG PO TABS
ORAL_TABLET | ORAL | Status: AC
  Filled 2014-12-05: qty 2

## 2014-12-05 MED ORDER — FENTANYL CITRATE (PF) 250 MCG/5ML IJ SOLN
INTRAMUSCULAR | Status: AC
  Filled 2014-12-05: qty 5

## 2014-12-05 MED ORDER — CEFAZOLIN SODIUM-DEXTROSE 2-3 GM-% IV SOLR
INTRAVENOUS | Status: AC
  Filled 2014-12-05: qty 50

## 2014-12-05 MED ORDER — BUPIVACAINE-EPINEPHRINE (PF) 0.5% -1:200000 IJ SOLN
INTRAMUSCULAR | Status: DC | PRN
  Administered 2014-12-05 (×2): 20 mL via PERINEURAL

## 2014-12-05 MED ORDER — CHLORHEXIDINE GLUCONATE 4 % EX LIQD: 60.0000 mL | Freq: Once | CUTANEOUS | Status: DC

## 2014-12-05 MED ORDER — GABAPENTIN 300 MG PO CAPS
300.0000 mg | ORAL_CAPSULE | Freq: Four times a day (QID) | ORAL | Status: DC
  Administered 2014-12-05 (×2): 300 mg via ORAL
  Filled 2014-12-05 (×3): qty 1

## 2014-12-05 MED ORDER — METOCLOPRAMIDE HCL 5 MG PO TABS: 5.0000 mg | ORAL_TABLET | Freq: Three times a day (TID) | ORAL | Status: DC | PRN

## 2014-12-05 MED ORDER — ALBUTEROL SULFATE (2.5 MG/3ML) 0.083% IN NEBU: 2.5000 mg | INHALATION_SOLUTION | Freq: Four times a day (QID) | RESPIRATORY_TRACT | Status: DC | PRN

## 2014-12-05 MED ORDER — HYDROMORPHONE 0.3 MG/ML IV SOLN
INTRAVENOUS | Status: DC
  Administered 2014-12-05: 0 mL via INTRAVENOUS
  Administered 2014-12-06: 02:00:00 via INTRAVENOUS
  Administered 2014-12-06: 3.9 mg via INTRAVENOUS
  Filled 2014-12-05: qty 25

## 2014-12-05 MED ORDER — HYDROMORPHONE HCL 1 MG/ML IJ SOLN
0.2500 mg | INTRAMUSCULAR | Status: DC | PRN
  Administered 2014-12-05 (×2): .5 mg via INTRAVENOUS

## 2014-12-05 MED ORDER — MIDAZOLAM HCL 5 MG/5ML IJ SOLN
INTRAMUSCULAR | Status: DC | PRN
  Administered 2014-12-05: 2 mg via INTRAVENOUS

## 2014-12-05 MED ORDER — DEXAMETHASONE SODIUM PHOSPHATE 4 MG/ML IJ SOLN
INTRAMUSCULAR | Status: DC | PRN
  Administered 2014-12-05: 8 mg via INTRAVENOUS

## 2014-12-05 MED ORDER — POTASSIUM CHLORIDE IN NACL 20-0.9 MEQ/L-% IV SOLN
INTRAVENOUS | Status: DC
  Administered 2014-12-06 – 2014-12-08 (×4): via INTRAVENOUS
  Filled 2014-12-05 (×6): qty 1000

## 2014-12-05 MED ORDER — MIDAZOLAM HCL 5 MG/ML IJ SOLN
2.0000 mg | Freq: Once | INTRAMUSCULAR | Status: AC
  Administered 2014-12-05: 2 mg via INTRAVENOUS

## 2014-12-05 MED ORDER — HYDROMORPHONE 0.3 MG/ML IV SOLN
INTRAVENOUS | Status: AC
  Administered 2014-12-05: 15:00:00
  Filled 2014-12-05: qty 25

## 2014-12-05 SURGICAL SUPPLY — 83 items
BANDAGE ELASTIC 4 VELCRO ST LF (GAUZE/BANDAGES/DRESSINGS) ×3 IMPLANT
BANDAGE ELASTIC 6 VELCRO ST LF (GAUZE/BANDAGES/DRESSINGS) ×3 IMPLANT
BANDAGE ESMARK 6X9 LF (GAUZE/BANDAGES/DRESSINGS) ×1 IMPLANT
BIT DRILL 2.9X70 QC CALB (BIT) ×3 IMPLANT
BIT DRILL 3.8X6 NS (BIT) ×3 IMPLANT
BIT DRILL 4.4 NS (BIT) ×3 IMPLANT
BLADE SURG 10 STRL SS (BLADE) ×6 IMPLANT
BNDG COHESIVE 6X5 TAN STRL LF (GAUZE/BANDAGES/DRESSINGS) ×3 IMPLANT
BNDG ESMARK 6X9 LF (GAUZE/BANDAGES/DRESSINGS) ×3
BNDG GAUZE ELAST 4 BULKY (GAUZE/BANDAGES/DRESSINGS) ×6 IMPLANT
BRUSH SCRUB DISP (MISCELLANEOUS) ×6 IMPLANT
CLOSURE WOUND 1/2 X4 (GAUZE/BANDAGES/DRESSINGS) ×1
COVER SURGICAL LIGHT HANDLE (MISCELLANEOUS) ×6 IMPLANT
CUFF TOURNIQUET SINGLE 34IN LL (TOURNIQUET CUFF) IMPLANT
DRAPE C-ARM 42X72 X-RAY (DRAPES) ×3 IMPLANT
DRAPE C-ARMOR (DRAPES) ×3 IMPLANT
DRAPE EXTREMITY T 121X128X90 (DRAPE) ×6 IMPLANT
DRAPE IMP U-DRAPE 54X76 (DRAPES) ×3 IMPLANT
DRAPE INCISE IOBAN 66X45 STRL (DRAPES) IMPLANT
DRAPE U-SHAPE 47X51 STRL (DRAPES) ×3 IMPLANT
DRSG ADAPTIC 3X8 NADH LF (GAUZE/BANDAGES/DRESSINGS) ×3 IMPLANT
ELECT REM PT RETURN 9FT ADLT (ELECTROSURGICAL) ×3
ELECTRODE REM PT RTRN 9FT ADLT (ELECTROSURGICAL) ×1 IMPLANT
EVACUATOR 1/8 PVC DRAIN (DRAIN) IMPLANT
GAUZE SPONGE 4X4 12PLY STRL (GAUZE/BANDAGES/DRESSINGS) ×3 IMPLANT
GLOVE BIO SURGEON STRL SZ7.5 (GLOVE) ×3 IMPLANT
GLOVE BIO SURGEON STRL SZ8 (GLOVE) ×3 IMPLANT
GLOVE BIO SURGEON STRL SZ8.5 (GLOVE) ×3 IMPLANT
GLOVE BIOGEL PI IND STRL 7.0 (GLOVE) ×2 IMPLANT
GLOVE BIOGEL PI IND STRL 7.5 (GLOVE) ×1 IMPLANT
GLOVE BIOGEL PI IND STRL 8 (GLOVE) ×1 IMPLANT
GLOVE BIOGEL PI INDICATOR 7.0 (GLOVE) ×4
GLOVE BIOGEL PI INDICATOR 7.5 (GLOVE) ×2
GLOVE BIOGEL PI INDICATOR 8 (GLOVE) ×2
GLOVE SURG SS PI 6.5 STRL IVOR (GLOVE) ×6 IMPLANT
GOWN STRL REUS W/ TWL LRG LVL3 (GOWN DISPOSABLE) ×2 IMPLANT
GOWN STRL REUS W/ TWL XL LVL3 (GOWN DISPOSABLE) ×1 IMPLANT
GOWN STRL REUS W/TWL LRG LVL3 (GOWN DISPOSABLE) ×4
GOWN STRL REUS W/TWL XL LVL3 (GOWN DISPOSABLE) ×2
GUIDEWIRE BALL NOSE 80CM (WIRE) ×6 IMPLANT
KIT BASIN OR (CUSTOM PROCEDURE TRAY) ×3 IMPLANT
KIT INFUSE LRG II (Orthopedic Implant) ×3 IMPLANT
KIT ROOM TURNOVER OR (KITS) ×3 IMPLANT
MANIFOLD NEPTUNE II (INSTRUMENTS) ×3 IMPLANT
NAIL TIBIA 8X33CM (Nail) ×3 IMPLANT
NEEDLE 22X1 1/2 (OR ONLY) (NEEDLE) IMPLANT
NS IRRIG 1000ML POUR BTL (IV SOLUTION) ×3 IMPLANT
PACK ORTHO EXTREMITY (CUSTOM PROCEDURE TRAY) ×3 IMPLANT
PAD ABD 8X10 STRL (GAUZE/BANDAGES/DRESSINGS) ×9 IMPLANT
PAD ARMBOARD 7.5X6 YLW CONV (MISCELLANEOUS) ×6 IMPLANT
PADDING CAST COTTON 6X4 STRL (CAST SUPPLIES) ×9 IMPLANT
PENCIL BUTTON HOLSTER BLD 10FT (ELECTRODE) ×3 IMPLANT
SCREW ACECAP 34MM (Screw) ×3 IMPLANT
SCREW ACECAP 40MM (Screw) ×3 IMPLANT
SCREW NLOCK CANC HEX 4X30 (Screw) ×2 IMPLANT
SCREW NLOCK CANC HEX 4X32 (Screw) ×3 IMPLANT
SCREW NLOCK CANC HEX 4X36 (Screw) ×3 IMPLANT
SCREW NLOCK T15 FT 30X4XST TIP (Screw) ×1 IMPLANT
SCREW PROXIMAL DEPUY (Screw) ×4 IMPLANT
SCREW PRXML FT 50X5.5XLCK NS (Screw) ×1 IMPLANT
SCREW PRXML FT 60X5.5XNS LF (Screw) ×1 IMPLANT
SPONGE GAUZE 4X4 12PLY STER LF (GAUZE/BANDAGES/DRESSINGS) ×6 IMPLANT
SPONGE LAP 18X18 X RAY DECT (DISPOSABLE) ×3 IMPLANT
SPONGE SCRUB IODOPHOR (GAUZE/BANDAGES/DRESSINGS) ×3 IMPLANT
STAPLER VISISTAT 35W (STAPLE) ×3 IMPLANT
STRIP CLOSURE SKIN 1/2X4 (GAUZE/BANDAGES/DRESSINGS) ×2 IMPLANT
SUCTION FRAZIER TIP 10 FR DISP (SUCTIONS) ×3 IMPLANT
SUT ETHILON 2 0 FS 18 (SUTURE) ×9 IMPLANT
SUT ETHILON 3 0 PS 1 (SUTURE) ×3 IMPLANT
SUT PDS AB 2-0 CT1 27 (SUTURE) ×6 IMPLANT
SUT VIC AB 0 CT1 27 (SUTURE) ×6
SUT VIC AB 0 CT1 27XBRD ANBCTR (SUTURE) ×3 IMPLANT
SUT VIC AB 2-0 CT1 27 (SUTURE) ×6
SUT VIC AB 2-0 CT1 TAPERPNT 27 (SUTURE) ×3 IMPLANT
SUT VIC AB 2-0 CT3 27 (SUTURE) IMPLANT
SYR CONTROL 10ML LL (SYRINGE) IMPLANT
TOWEL OR 17X24 6PK STRL BLUE (TOWEL DISPOSABLE) ×6 IMPLANT
TOWEL OR 17X26 10 PK STRL BLUE (TOWEL DISPOSABLE) ×6 IMPLANT
TUBE CONNECTING 12'X1/4 (SUCTIONS) ×1
TUBE CONNECTING 12X1/4 (SUCTIONS) ×2 IMPLANT
UNDERPAD 30X30 INCONTINENT (UNDERPADS AND DIAPERS) ×3 IMPLANT
WATER STERILE IRR 1000ML POUR (IV SOLUTION) ×6 IMPLANT
YANKAUER SUCT BULB TIP NO VENT (SUCTIONS) ×3 IMPLANT

## 2014-12-05 NOTE — Transfer of Care (Signed)
Immediate Anesthesia Transfer of Care Note  Patient: April [name]  Procedure(s) Performed: Procedure(s): INTRAMEDULLARY (IM) NAIL RIGHT  TIBIAL (Right) HARDWARE REMOVAL RIGHT TIBIAL (Right)  Patient Location: PACU  Anesthesia Type:General and GA combined with regional for post-op pain  Level of Consciousness: awake, alert , oriented, patient cooperative and responds to stimulation  Airway & Oxygen Therapy: Patient Spontanous Breathing and Patient connected to nasal cannula oxygen  Post-op Assessment: Report given to RN, Post -op Vital signs reviewed and stable and Patient moving all extremities X 4  Post vital signs: Reviewed and stable  Last Vitals:  Filed Vitals:   12/05/14 1018  BP:   Pulse:   Temp:   Resp: 12    Complications: No apparent anesthesia complications

## 2014-12-05 NOTE — H&P (Signed)
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 She has undergone numerous procedures to her right tibia most recent one included plate osteosynthesis with ICBG. Patient had been doing well following the surgery however she had been having some issues regarding her pain. She is referred to outpatient anesthesiology where she was getting selective nerve hardware doing quite well. About a week ago she felt a pop in her right leg. She presented to the office for workup which demonstrated a broken hardware and persistent nonunion. She presents today for hardware removal and IM nailing of her right tibia.  Past Medical History  Diagnosis Date  . COPD (chronic obstructive pulmonary disease)   . Asthma   . Anxiety   . GERD (gastroesophageal reflux disease)     years ago  . History of hiatal hernia     years ago  . Headache     migraines   . Neuromuscular disorder     nerve damage in right leg  . Arthritis   . Cancer     uterine cancer, hysterectomy - ?2005  . Anemia     years ago  . Irritable bowel syndrome   . ADHD (attention deficit hyperactivity disorder)   . Family history of adverse reaction to anesthesia     " mother had difficulty waking "  "had alot of issues, including infection".  . Shortness of breath dyspnea   . Pneumonia 2012  . Depression   . Complication of anesthesia     woke up with an anxiety attack with last surgery (2013), pt. reports that she has high tolerance to medicine, also has woken during anesth.     Past Surgical History  Procedure Laterality Date  . Tonsillectomy    . Tubal ligation    . Bunionectomy Bilateral   . Esophagogastroduodenoscopy    . Fracture surgery      right tibia  . Dilation and curettage of uterus    . Wisdom tooth extraction    . Colonoscopy    . Orif  tibia fracture Right 04/02/2014    Procedure: OPEN REDUCTION INTERNAL FIXATION (ORIF) RIGHT DISTAL TIBIA FRACTURE;  Surgeon: Rozanna Box, MD;  Location: [place];  Service: Orthopedics;  Laterality: Right;  . Hardware removal Right 04/02/2014    Procedure: AND HARDWARE REMOVAL ;  Surgeon: Rozanna Box, MD;  Location: [place];  Service: Orthopedics;  Laterality: Right;  . Harvest bone graft Right 04/02/2014    Procedure:  AND ILIAC BONE GRAFT;  Surgeon: Rozanna Box, MD;  Location: [place];  Service: Orthopedics;  Laterality: Right;  . Vaginal hysterectomy  2000  . Tibia osteotomy Right 07/16/2014    Procedure: RIGHT TIBIAL OSTEOTOMY;  Surgeon: Rozanna Box, MD;  Location: [place];  Service: Orthopedics;  Laterality: Right;  . Hardware removal Right 07/16/2014    Procedure: HARDWARE REMOVAL;  Surgeon: Rozanna Box, MD;  Location: [place];  Service: Orthopedics;  Laterality: Right;    Family History  Problem Relation Age of Onset  . Lung cancer Mother   . Heart disease Father   . Alcoholism Father    Social History:  reports that she quit smoking about a year ago. Her smoking use included Cigarettes. She has never used smokeless tobacco. She reports that she does not drink alcohol or  use illicit drugs.  Allergies: No Known Allergies  Medications Prior to Admission  Medication Sig Dispense Refill  . albuterol (PROVENTIL HFA;VENTOLIN HFA) 108 (90 BASE) MCG/ACT inhaler Inhale 2 puffs into the lungs every 6 (six) hours as needed for wheezing or shortness of breath.    . gabapentin (NEURONTIN) 300 MG capsule Take 300 mg by mouth 4 (four) times daily.    . montelukast (SINGULAIR) 10 MG tablet Take 10 mg by mouth at bedtime.    Marland Kitchen oxyCODONE-acetaminophen (PERCOCET) 10-325 MG per tablet Take 1-2 tablets by mouth every 6 (six) hours as needed for pain. (Patient taking differently: Take 2 tablets by mouth every 6 (six) hours as needed for pain. ) 90 tablet 0  . enoxaparin (LOVENOX) 40 MG/0.4ML  injection Inject 0.4 mLs (40 mg total) into the skin daily. (Patient not taking: Reported on 12/04/2014) 14 Syringe 0  . methocarbamol (ROBAXIN) 500 MG tablet Take 1-2 tablets (500-1,000 mg total) by mouth every 6 (six) hours as needed for muscle spasms. (Patient not taking: Reported on 12/04/2014) 80 tablet 1  . oxyCODONE (OXY IR/ROXICODONE) 5 MG immediate release tablet Take 1-2 tablets (5-10 mg total) by mouth every 3 (three) hours as needed for breakthrough pain. (Patient not taking: Reported on 12/04/2014) 30 tablet 0  . pregabalin (LYRICA) 100 MG capsule Take 1 capsule (100 mg total) by mouth 3 (three) times daily. (Patient not taking: Reported on 12/04/2014) 90 capsule 1    Labs pending  Review of Systems  Constitutional: Negative for fever and chills.  Respiratory: Negative for shortness of breath and wheezing.   Cardiovascular: Negative for chest pain and palpitations.  Gastrointestinal: Negative for nausea, vomiting and abdominal pain.  Musculoskeletal:       Right leg pain  Neurological: Positive for tingling and sensory change (Right leg).   Vitals to be taken on arrival to short stay Physical Exam  Constitutional: She is cooperative.  Cardiovascular: Normal rate, regular rhythm, S1 normal and S2 normal.   Respiratory: Effort normal and breath sounds normal.  GI: Normal appearance and bowel sounds are normal. There is no tenderness.  Musculoskeletal:  Right lower extremity   Surgical wounds well healed   Right leg is hypersensitive   Extremity is warm  + DP pulse  ankle range of motion with 20 of extension to 30 of flexion. No significant swelling distally  DPN and TN sensory functions grossly intact  SPN sensory functions decreased slightly this is baseline for patient since we have cared for her  EHL FHL, anterior tibialis, posterior tibialis, peroneals and gastrocsoleus complex motor functions intact     Neurological: She is alert.      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

## 2014-12-05 NOTE — Op Note (Signed)
12/05/2014  2:31 PM  PATIENT:  April [name]  44 y.o. female  PRE-OPERATIVE DIAGNOSIS:  RIGHT TIBIAL NONUNION, BROKEN HARDWARE  POST-OPERATIVE DIAGNOSIS:  RIGHT TIBIAL NONUNION, BROKEN HARDWARE  PROCEDURE:  Procedure(s): 1. INTRAMEDULLARY (IM) NAIL RIGHT  TIBIAL (Right), BIOMET Versanail 8 x 330 statically locked, with blocking screws 2. REPAIR OF TIBIAL NONUNION WITH AUTOGRAFT 3. HARDWARE REMOVAL RIGHT TIBIAL (Right)  SURGEON:  Surgeon(s) and Role:    * Altamese Wyndmere, MD - Primary  PHYSICIAN ASSISTANT: Ainsley Spinner, PA-C  ANESTHESIA:   general WITH REGIONAL BLOCKS X 2  I/O:  Total I/O In: 1600 [I.V.:1600] Out: 100 [Blood:100]  SPECIMEN:  TIBIAL NONUNION SITE  DISPOSITION OF SPECIMEN:  To micro  TOURNIQUET:  * No tourniquets in log *  DICTATION: .Other Dictation: Dictation Number [id number]

## 2014-12-05 NOTE — Anesthesia Postprocedure Evaluation (Signed)
Anesthesia Post Note  Patient: April [name]  Procedure(s) Performed: Procedure(s) (LRB): INTRAMEDULLARY (IM) NAIL RIGHT  TIBIAL (Right) HARDWARE REMOVAL RIGHT TIBIAL (Right)  Anesthesia type: General  Patient location: PACU  Post pain: Pain level controlled and Adequate analgesia  Post assessment: Post-op Vital signs reviewed, Patient's Cardiovascular Status Stable, Respiratory Function Stable, Patent Airway and Pain level controlled  Last Vitals:  Filed Vitals:   12/05/14 1605  BP: 90/41  Pulse: 82  Temp: 36.8 C  Resp: 16    Post vital signs: Reviewed and stable  Level of consciousness: awake, alert  and oriented  Complications: No apparent anesthesia complications

## 2014-12-05 NOTE — H&P (Signed)
April [name] is an 44 y.o. female.   Chief Complaint: ununited right tibial osteotomy HPI: S/p remote IMN with nonunion and broken hardware, which underwent successful nonunion repair in situ, but then developed fibula stress fracture which required tibial osteotomy.  Despite recent CT scan demonstrating some healing patient went on to fracture screws.  Has had RSD/ CRP nearly throughout with succcessful response to sympatholysis by Dr. Isaiah Blakes.    Past Medical History  Diagnosis Date  . COPD (chronic obstructive pulmonary disease)   . Asthma   . Anxiety   . GERD (gastroesophageal reflux disease)     years ago  . History of hiatal hernia     years ago  . Headache     migraines   . Neuromuscular disorder     nerve damage in right leg  . Arthritis   . Cancer     uterine cancer, hysterectomy - ?2005  . Anemia     years ago  . Irritable bowel syndrome   . ADHD (attention deficit hyperactivity disorder)   . Family history of adverse reaction to anesthesia     " mother had difficulty waking "  "had alot of issues, including infection".  . Shortness of breath dyspnea   . Pneumonia 2012  . Depression   . Complication of anesthesia     woke up with an anxiety attack with last surgery (2013), pt. reports that she has high tolerance to medicine, also has woken during anesth.     Past Surgical History  Procedure Laterality Date  . Tonsillectomy    . Tubal ligation    . Bunionectomy Bilateral   . Esophagogastroduodenoscopy    . Fracture surgery      right tibia  . Dilation and curettage of uterus    . Wisdom tooth extraction    . Colonoscopy    . Orif tibia fracture Right 04/02/2014    Procedure: OPEN REDUCTION INTERNAL FIXATION (ORIF) RIGHT DISTAL TIBIA FRACTURE;  Surgeon: Rozanna Box, MD;  Location: [place];  Service: Orthopedics;  Laterality: Right;  . Hardware removal Right 04/02/2014    Procedure: AND HARDWARE REMOVAL ;  Surgeon: Rozanna Box, MD;  Location: [place];   Service: Orthopedics;  Laterality: Right;  . Harvest bone graft Right 04/02/2014    Procedure:  AND ILIAC BONE GRAFT;  Surgeon: Rozanna Box, MD;  Location: [place];  Service: Orthopedics;  Laterality: Right;  . Vaginal hysterectomy  2000  . Tibia osteotomy Right 07/16/2014    Procedure: RIGHT TIBIAL OSTEOTOMY;  Surgeon: Rozanna Box, MD;  Location: [place];  Service: Orthopedics;  Laterality: Right;  . Hardware removal Right 07/16/2014    Procedure: HARDWARE REMOVAL;  Surgeon: Rozanna Box, MD;  Location: [place];  Service: Orthopedics;  Laterality: Right;    Family History  Problem Relation Age of Onset  . Lung cancer Mother   . Heart disease Father   . Alcoholism Father    Social History:  reports that she quit smoking about a year ago. Her smoking use included Cigarettes. She has never used smokeless tobacco. She reports that she does not drink alcohol or use illicit drugs.  Allergies: No Known Allergies  Medications Prior to Admission  Medication Sig Dispense Refill  . albuterol (PROVENTIL HFA;VENTOLIN HFA) 108 (90 BASE) MCG/ACT inhaler Inhale 2 puffs into the lungs every 6 (six) hours as needed for wheezing or shortness of breath.    . gabapentin (NEURONTIN) 300 MG capsule Take 300  mg by mouth 4 (four) times daily.    . montelukast (SINGULAIR) 10 MG tablet Take 10 mg by mouth at bedtime.    Marland Kitchen oxyCODONE-acetaminophen (PERCOCET) 10-325 MG per tablet Take 1-2 tablets by mouth every 6 (six) hours as needed for pain. (Patient taking differently: Take 2 tablets by mouth every 6 (six) hours as needed for pain. ) 90 tablet 0  . enoxaparin (LOVENOX) 40 MG/0.4ML injection Inject 0.4 mLs (40 mg total) into the skin daily. (Patient not taking: Reported on 12/04/2014) 14 Syringe 0  . methocarbamol (ROBAXIN) 500 MG tablet Take 1-2 tablets (500-1,000 mg total) by mouth every 6 (six) hours as needed for muscle spasms. (Patient not taking: Reported on 12/04/2014) 80 tablet 1  . oxyCODONE (OXY  IR/ROXICODONE) 5 MG immediate release tablet Take 1-2 tablets (5-10 mg total) by mouth every 3 (three) hours as needed for breakthrough pain. (Patient not taking: Reported on 12/04/2014) 30 tablet 0  . pregabalin (LYRICA) 100 MG capsule Take 1 capsule (100 mg total) by mouth 3 (three) times daily. (Patient not taking: Reported on 12/04/2014) 90 capsule 1    Results for orders placed or performed during the hospital encounter of 12/05/14 (from the past 48 hour(s))  CBC WITH DIFFERENTIAL     Status: Abnormal   Collection Time: 12/05/14  7:52 AM  Result Value Ref Range   WBC 12.2 (H) 4.0 - 10.5 K/uL   RBC 4.11 3.87 - 5.11 MIL/uL   Hemoglobin 12.9 12.0 - 15.0 g/dL   HCT 37.0 36.0 - 46.0 %   MCV 90.0 78.0 - 100.0 fL   MCH 31.4 26.0 - 34.0 pg   MCHC 34.9 30.0 - 36.0 g/dL   RDW 13.5 11.5 - 15.5 %   Platelets 428 (H) 150 - 400 K/uL   Neutrophils Relative % 72 43 - 77 %   Neutro Abs 8.7 (H) 1.7 - 7.7 K/uL   Lymphocytes Relative 20 12 - 46 %   Lymphs Abs 2.4 0.7 - 4.0 K/uL   Monocytes Relative 6 3 - 12 %   Monocytes Absolute 0.8 0.1 - 1.0 K/uL   Eosinophils Relative 2 0 - 5 %   Eosinophils Absolute 0.3 0.0 - 0.7 K/uL   Basophils Relative 0 0 - 1 %   Basophils Absolute 0.0 0.0 - 0.1 K/uL   No results found.  ROS No recent fever, bleeding abnormalities, urologic dysfunction, GI problems, or weight gain.  Height 5\' 7"  (1.702 m), weight 115 lb (52.164 kg). Physical Exam  CTA RRR S/NT/ND RLE Healed medial incision  Tender over entire right leg and hypersensitive  No effusion  Sens DPN, SPN, TN intact  Motor EHL, ext, flex, evers intact  DP 2+, PT 2+, No significant edema   Assessment/Plan Broken hardware and ununited right tibial osteotomy  I discussed with the patient the risks and benefits of surgery for her right tibia, including the possibility of infection, nerve injury, vessel injury, wound breakdown, arthritis, symptomatic hardware, DVT/ PE, loss of motion, exacerbation of  neuralgia, and need for further surgery among others.  She understood these risks and wished to proceed.  Altamese Emsworth, MD [facility] [phone] (p)  12/05/2014, 8:09 AM

## 2014-12-05 NOTE — Progress Notes (Signed)
Orthopedic Tech Progress Note Patient Details:  April [name] 07-04-1970 [id number]  Ortho Devices Type of Ortho Device:  (prafo boot) Ortho Device/Splint Interventions: Loanne Drilling, Teasha Murrillo 12/05/2014, 2:33 PM

## 2014-12-05 NOTE — Anesthesia Procedure Notes (Addendum)
Anesthesia Regional Block:  Popliteal block  Pre-Anesthetic Checklist: ,, timeout performed, Correct Patient, Correct Site, Correct Laterality, Correct Procedure, Correct Position, site marked, Risks and benefits discussed,  Surgical consent,  Pre-op evaluation,  At surgeon's request and post-op pain management  Laterality: Right and Lower  Prep: chloraprep       Needles:  Injection technique: Single-shot     Needle Length: 9cm 9 cm Needle Gauge: 22 and 22 G    Additional Needles:  Procedures: nerve stimulator Popliteal block  Nerve Stimulator or Paresthesia:  Response: toe dorsiflexion, 0.4 mA, 0.1 ms,  Response: toe abduction, 0.4 mA, 0.1 ms,   Additional Responses:   Narrative:  Start time: 12/05/2014 9:10 AM End time: 12/05/2014 9:16 AM Injection made incrementally with aspirations every 5 mL.  Performed by: Personally  Anesthesiologist: Glennon Mac, Sylvan Sookdeo  Additional Notes: Pt identified in Holding room.  Monitors applied. Working IV access confirmed. Sterile prep R lateral knee.  #22ga PNS to toe abduction and toe dorsiflexion twitches at 0.5mA threshold.  20cc 0.5% Bupivacaine with 1:200k epi injected incrementally after negative test dose.  Patient asymptomatic, VSS, no heme aspirated, tolerated well.  Jenita Seashore, MD   Anesthesia Regional Block:  Femoral nerve block  Pre-Anesthetic Checklist: ,, timeout performed, Correct Patient, Correct Site, Correct Laterality, Correct Procedure, Correct Position, site marked, Risks and benefits discussed,  Surgical consent,  Pre-op evaluation,  At surgeon's request and post-op pain management  Laterality: Right and Lower  Prep: chloraprep       Needles:  Injection technique: Single-shot  Needle Type: Echogenic Stimulator Needle     Needle Length: 5cm 5 cm Needle Gauge: 22 and 22 G    Additional Needles:  Procedures: nerve stimulator Femoral nerve block  Nerve Stimulator or Paresthesia:  Response: patella  twitch, 0.4 mA, 0.1 ms,   Additional Responses:   Narrative:  Start time: 12/05/2014 9:17 AM End time: 12/05/2014 9:24 AM Injection made incrementally with aspirations every 5 mL.  Performed by: Personally  Anesthesiologist: Glennon Mac, Tymeir Weathington  Additional Notes: Pt identified in Holding room.  Monitors applied. Working IV access confirmed. Sterile prep R groin.  #22ga PNS to patella twitch at 0.31mA threshold.  20cc 0.5% Bupivacaine with 1:200k epi injected incrementally after negative test dose.  Patient asymptomatic, VSS, no heme aspirated, tolerated well.  Jenita Seashore, MD

## 2014-12-06 ENCOUNTER — Encounter (HOSPITAL_COMMUNITY): Payer: Self-pay | Admitting: Orthopedic Surgery

## 2014-12-06 DIAGNOSIS — S82301K Unspecified fracture of lower end of right tibia, subsequent encounter for closed fracture with nonunion: Secondary | ICD-10-CM | POA: Diagnosis present

## 2014-12-06 LAB — BASIC METABOLIC PANEL
Anion gap: 7 (ref 5–15)
BUN: 5 mg/dL — ABNORMAL LOW (ref 6–20)
CHLORIDE: 108 mmol/L (ref 101–111)
CO2: 23 mmol/L (ref 22–32)
CREATININE: 0.36 mg/dL — AB (ref 0.44–1.00)
Calcium: 8.4 mg/dL — ABNORMAL LOW (ref 8.9–10.3)
GFR calc non Af Amer: >60 mL/min (ref >60)
GLUCOSE: 110 mg/dL — AB (ref 65–99)
Potassium: 3.8 mmol/L (ref 3.5–5.1)
Sodium: 138 mmol/L (ref 135–145)

## 2014-12-06 LAB — VITAMIN D 25 HYDROXY (VIT D DEFICIENCY, FRACTURES): VIT D 25 HYDROXY: 43.5 ng/mL (ref 30.0–100.0)

## 2014-12-06 MED ORDER — FENTANYL CITRATE (PF) 100 MCG/2ML IJ SOLN
50.0000 ug | INTRAMUSCULAR | Status: DC | PRN
  Administered 2014-12-09 – 2014-12-10 (×12): 50 ug via INTRAVENOUS
  Filled 2014-12-06 (×12): qty 2

## 2014-12-06 MED ORDER — FENTANYL 10 MCG/ML IV SOLN
INTRAVENOUS | Status: DC
  Administered 2014-12-06: 17:00:00 via INTRAVENOUS
  Administered 2014-12-06: 255 ug via INTRAVENOUS
  Administered 2014-12-06: 180 ug via INTRAVENOUS
  Administered 2014-12-06 – 2014-12-07 (×4): via INTRAVENOUS
  Administered 2014-12-07: 210 ug via INTRAVENOUS
  Administered 2014-12-07: 08:00:00 via INTRAVENOUS
  Administered 2014-12-07: 255 ug via INTRAVENOUS
  Administered 2014-12-07: 345 ug via INTRAVENOUS
  Administered 2014-12-08: 255 ug via INTRAVENOUS
  Administered 2014-12-08: 250.6 ug/h via INTRAVENOUS
  Administered 2014-12-08: 270 ug via INTRAVENOUS
  Administered 2014-12-08: 09:00:00 via INTRAVENOUS
  Administered 2014-12-08: 233.9 ug via INTRAVENOUS
  Administered 2014-12-08: 120 ug via INTRAVENOUS
  Administered 2014-12-08: 210 ug via INTRAVENOUS
  Administered 2014-12-08: 10 ug via INTRAVENOUS
  Administered 2014-12-09: 02:00:00 via INTRAVENOUS
  Administered 2014-12-09: 180 ug via INTRAVENOUS
  Administered 2014-12-09: 400 ug via INTRAVENOUS
  Filled 2014-12-06 (×9): qty 50

## 2014-12-06 MED ORDER — GABAPENTIN 300 MG PO CAPS
600.0000 mg | ORAL_CAPSULE | ORAL | Status: DC
  Administered 2014-12-06 – 2014-12-10 (×16): 600 mg via ORAL
  Filled 2014-12-06 (×15): qty 2

## 2014-12-06 MED ORDER — DIPHENHYDRAMINE HCL 12.5 MG/5ML PO ELIX
12.5000 mg | ORAL_SOLUTION | Freq: Four times a day (QID) | ORAL | Status: DC | PRN
  Administered 2014-12-08: 12.5 mg via ORAL
  Filled 2014-12-06: qty 10

## 2014-12-06 MED ORDER — DOCUSATE SODIUM 100 MG PO CAPS: 100.0000 mg | ORAL_CAPSULE | Freq: Two times a day (BID) | ORAL | Status: DC

## 2014-12-06 MED ORDER — METHOCARBAMOL 500 MG PO TABS: 500.0000 mg | ORAL_TABLET | Freq: Four times a day (QID) | ORAL | Status: DC | PRN

## 2014-12-06 MED ORDER — OXYCODONE HCL 5 MG PO TABS: 5.0000 mg | ORAL_TABLET | ORAL | Status: DC | PRN

## 2014-12-06 MED ORDER — ASPIRIN EC 325 MG PO TBEC: 325.0000 mg | DELAYED_RELEASE_TABLET | Freq: Two times a day (BID) | ORAL | Status: DC

## 2014-12-06 MED ORDER — DIAZEPAM 5 MG PO TABS
5.0000 mg | ORAL_TABLET | Freq: Once | ORAL | Status: AC
  Administered 2014-12-06: 5 mg via ORAL
  Filled 2014-12-06: qty 1

## 2014-12-06 MED ORDER — SODIUM CHLORIDE 0.9 % IJ SOLN: 9.0000 mL | INTRAMUSCULAR | Status: DC | PRN

## 2014-12-06 MED ORDER — NALOXONE HCL 0.4 MG/ML IJ SOLN: 0.4000 mg | INTRAMUSCULAR | Status: DC | PRN

## 2014-12-06 MED ORDER — LORAZEPAM 1 MG PO TABS
1.0000 mg | ORAL_TABLET | Freq: Once | ORAL | Status: AC
  Administered 2014-12-06: 1 mg via ORAL
  Filled 2014-12-06: qty 1

## 2014-12-06 MED ORDER — OXYCODONE HCL 5 MG PO TABS: 5.0000 mg | ORAL_TABLET | Freq: Four times a day (QID) | ORAL | Status: DC | PRN

## 2014-12-06 MED ORDER — HYDROMORPHONE HCL 1 MG/ML IJ SOLN
1.0000 mg | Freq: Once | INTRAMUSCULAR | Status: AC
  Administered 2014-12-06: 1 mg via INTRAVENOUS
  Filled 2014-12-06: qty 1

## 2014-12-06 MED ORDER — OXYCODONE-ACETAMINOPHEN 5-325 MG PO TABS: 1.0000 | ORAL_TABLET | Freq: Four times a day (QID) | ORAL | Status: DC | PRN

## 2014-12-06 MED ORDER — ONDANSETRON HCL 4 MG/2ML IJ SOLN: 4.0000 mg | Freq: Four times a day (QID) | INTRAMUSCULAR | Status: DC | PRN

## 2014-12-06 MED ORDER — DIPHENHYDRAMINE HCL 50 MG/ML IJ SOLN: 12.5000 mg | Freq: Four times a day (QID) | INTRAMUSCULAR | Status: DC | PRN

## 2014-12-06 MED ORDER — FENTANYL CITRATE (PF) 100 MCG/2ML IJ SOLN
50.0000 ug | Freq: Once | INTRAMUSCULAR | Status: AC
  Administered 2014-12-06: 50 ug via INTRAVENOUS
  Filled 2014-12-06: qty 2

## 2014-12-06 MED ORDER — OXYCODONE HCL 5 MG PO TABS
5.0000 mg | ORAL_TABLET | ORAL | Status: DC | PRN
  Administered 2014-12-06 – 2014-12-09 (×19): 15 mg via ORAL
  Administered 2014-12-09: 10 mg via ORAL
  Administered 2014-12-10 (×4): 15 mg via ORAL
  Filled 2014-12-06 (×10): qty 3
  Filled 2014-12-06: qty 2
  Filled 2014-12-06 (×12): qty 3
  Filled 2014-12-06: qty 1

## 2014-12-06 MED ORDER — GABAPENTIN 300 MG PO CAPS
900.0000 mg | ORAL_CAPSULE | Freq: Every day | ORAL | Status: DC
  Administered 2014-12-06 – 2014-12-08 (×3): 900 mg via ORAL
  Filled 2014-12-06 (×3): qty 3

## 2014-12-06 MED ORDER — OXYCODONE HCL 5 MG PO TABS
5.0000 mg | ORAL_TABLET | Freq: Four times a day (QID) | ORAL | Status: DC | PRN
  Administered 2014-12-07 (×2): 5 mg via ORAL
  Filled 2014-12-06 (×2): qty 2

## 2014-12-06 MED ORDER — OXYCODONE-ACETAMINOPHEN 5-325 MG PO TABS
1.0000 | ORAL_TABLET | Freq: Four times a day (QID) | ORAL | Status: DC | PRN
  Administered 2014-12-07: 1 via ORAL
  Administered 2014-12-07 – 2014-12-09 (×6): 2 via ORAL
  Filled 2014-12-06: qty 2
  Filled 2014-12-06: qty 1
  Filled 2014-12-06 (×5): qty 2

## 2014-12-06 MED ORDER — LORAZEPAM 1 MG PO TABS
1.0000 mg | ORAL_TABLET | Freq: Four times a day (QID) | ORAL | Status: DC | PRN
  Administered 2014-12-06 – 2014-12-10 (×12): 1 mg via ORAL
  Filled 2014-12-06 (×12): qty 1

## 2014-12-06 MED ORDER — OXYCODONE-ACETAMINOPHEN 10-325 MG PO TABS: 1.0000 | ORAL_TABLET | Freq: Four times a day (QID) | ORAL | Status: DC | PRN

## 2014-12-06 NOTE — Progress Notes (Signed)
Utilization review completed. Javier Mamone, RN, BSN. 

## 2014-12-06 NOTE — Evaluation (Signed)
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 She has undergone numerous procedures to her right tibia most recent one included plate osteosynthesis with ICBG. Patient had been doing well following the surgery however she had been having some issues regarding her pain. She is referred to outpatient anesthesiology where she was getting selective nerve hardware doing quite well. About a week ago she felt a pop in her right leg. She presented to the office for workup which demonstrated a broken hardware and persistent nonunion. She presents today for hardware removal and IM nailing of her right tibia.  Clinical Impression  Patient is s/p above surgery resulting in functional limitations due to the deficits listed below (see PT Problem List). Pt very impulsive and emotionally upset about current situation saying, "I've been dealing with this for 5 years and I'm just tired".  Pt refused to ambulate in hall this session 2/2 pain and fatigue. Pt will benefit from RW for increased stability and WC for longer distances. Patient will benefit from skilled PT to increase their independence and safety with mobility to allow discharge to the venue listed below.      Follow Up Recommendations Supervision for mobility/OOB;Outpatient PT (OPPT when appropriate)    Equipment Recommendations  Rolling walker with 5" wheels;Wheelchair (measurements PT);Wheelchair cushion (measurements PT)    Recommendations for Other Services       Precautions / Restrictions Precautions Precautions: Fall Precaution Comments: Pt impulsive  Restrictions Weight Bearing Restrictions: Yes RLE Weight Bearing: Touchdown weight bearing      Mobility  Bed Mobility Overal bed mobility: Modified  Independent             General bed mobility comments: Mod I w/ increased time sit>supine.  Pt does not want help as says, "I have figued out how to do all of this on my own"  Transfers Overall transfer level: Needs assistance Equipment used: Rolling walker (2 wheeled) Transfers: Sit to/from Stand Sit to Stand: Supervision         General transfer comment: Supervision for safety as pt is impulsive and moves quickly.  Cues to take her time to prevent fall.  Ambulation/Gait Ambulation/Gait assistance: Min guard Ambulation Distance (Feet): 15 Feet Assistive device: Rolling walker (2 wheeled) Gait Pattern/deviations: Step-to pattern;Antalgic   Gait velocity interpretation: Below normal speed for age/gender General Gait Details: Pt moves quickly and is impulsive.  Min guard for safety.  Pt refuses to ambulate farther as she is "very tired and I haven't slept in 5 days"  Stairs            Wheelchair Mobility    Modified Rankin (Stroke Patients Only)       Balance Overall balance assessment: Needs assistance Sitting-balance support: No upper extremity supported;Feet supported Sitting balance-Leahy Scale: Good     Standing balance support: Bilateral upper extremity supported;During functional activity Standing balance-Leahy Scale: Poor Standing balance comment: relies on RW                             Pertinent Vitals/Pain Pain Assessment: 0-10 Pain Score: 10-Worst pain ever Pain Location: R ankle Pain Descriptors / Indicators: Aching;Throbbing Pain Intervention(s): PCA encouraged;Monitored during session;Limited activity within patient's tolerance;Repositioned    Home Living Family/patient expects to be discharged to::  Private residence Living Arrangements: Children (daughter lives with her but is pregnant) Available Help at Discharge: Family;Available 24 hours/day (Daughter cannot provide a lot of physical assist) Type of Home: House Home  Access: Level entry     Home Layout: One level Home Equipment: Crutches;Shower seat      Prior Function Level of Independence: Independent         Comments: Used Rw and/or crutches in the past, depending on the circumstances     Hand Dominance   Dominant Hand: Right    Extremity/Trunk Assessment   Upper Extremity Assessment: Defer to OT evaluation           Lower Extremity Assessment: RLE deficits/detail RLE Deficits / Details: s/p R tibia IM nail    Cervical / Trunk Assessment: Normal  Communication   Communication: No difficulties  Cognition Arousal/Alertness: Awake/alert Behavior During Therapy: Agitated;Impulsive Overall Cognitive Status: Within Functional Limits for tasks assessed                      General Comments General comments (skin integrity, edema, etc.): When asked if pt has fallen in the past 2/2 WB restrictions and pt responds, "Sure!  But I always catch myself enough to keep my R foot from being damaged"    Exercises        Assessment/Plan    PT Assessment Patient needs continued PT services  PT Diagnosis Difficulty walking;Abnormality of gait;Generalized weakness;Acute pain   PT Problem List Decreased strength;Decreased range of motion;Decreased activity tolerance;Decreased balance;Decreased mobility;Decreased coordination;Decreased knowledge of use of DME;Decreased safety awareness;Decreased knowledge of precautions;Decreased skin integrity;Pain  PT Treatment Interventions DME instruction;Gait training;Stair training;Functional mobility training;Therapeutic activities;Therapeutic exercise;Balance training;Neuromuscular re-education;Patient/family education;Modalities;Wheelchair mobility training   PT Goals (Current goals can be found in the Care Plan section) Acute Rehab PT Goals Patient Stated Goal: to have her pain go away, to go home and spend time with her daughter who is pregnant PT Goal Formulation: With patient Time For  Goal Achievement: 12/13/14 Potential to Achieve Goals: Good    Frequency Min 5X/week   Barriers to discharge Decreased caregiver support No physical assist available at home    Co-evaluation               End of Session   Activity Tolerance: Patient limited by pain Patient left: in bed;with call bell/phone within reach Nurse Communication: Mobility status;Precautions;Weight bearing status;Patient requests pain meds         Time: 1422-1440 PT Time Calculation (min) (ACUTE ONLY): 18 min   Charges:   PT Evaluation $Initial PT Evaluation Tier I: 1 Procedure     PT G Codes:       Joslyn Hy PT, DPT [phone] Pager: [phone] 12/06/2014, 2:55 PM

## 2014-12-06 NOTE — Care Management Note (Signed)
Case Management Note  Patient Details  Name: Kevin Mario MRN: [id number] Date of Birth: Feb 22, 1971  Subjective/Objective:    44yr old female s/p right tibia IM Nailing              Action/Plan:  Patient has no Home Health needs. DME has been ordered.     Expected Discharge Date:   12/07/14               Expected Discharge Plan:   Home/ Self Care In-House Referral:  NA  Discharge planning Services  CM Consult  Post Acute Care Choice:  Durable Medical Equipment Choice offered to:  NA  DME Arranged:  Walker rolling, Wheelchair manual DME Agency:  [place]:  NA [place] Agency:  NA  Status of Service:  In process, will continue to follow  Medicare Important Message Given:    Date Medicare IM Given:    Medicare IM give by:    Date Additional Medicare IM Given:    Additional Medicare Important Message give by:     If discussed at [place] of Stay Meetings, dates discussed:    Additional Comments:  Ninfa Meeker, RN 12/06/2014, 3:36 PM

## 2014-12-06 NOTE — Progress Notes (Signed)
[facility] Progress Note  Subjective  Tearful Severe pain Block wore off around 0400 and has been in excruciating pain since then Dilaudid PCA not working  C/o sharp, shooting and throbbing pain in R leg States leg has been elevated most of the night   Review of Systems  Constitutional: Negative for fever and chills.  Eyes: Negative for blurred vision.  Respiratory: Negative for shortness of breath and wheezing.   Cardiovascular: Negative for chest pain and palpitations.  Gastrointestinal: Negative for nausea, vomiting and abdominal pain.  Genitourinary: Negative for dysuria.  Neurological: Positive for tingling and sensory change. Negative for headaches.       R leg      Objective   BP 106/52 mmHg  Pulse 88  Temp(Src) 98.9 F (37.2 C) (Oral)  Resp 16  Ht 5' 7"  (1.702 m)  Wt 52.164 kg (115 lb)  BMI 18.01 kg/m2  SpO2 99%  Intake/Output      07/14 0701 - 07/15 0700 07/15 0701 - 07/16 0700   P.O. 100    I.V. (mL/kg) 1800 (34.5)    IV Piggyback 100    Total Intake(mL/kg) 2000 (38.3)    Urine (mL/kg/hr) 775    Blood 100    Total Output 875     Net +1125          Urine Occurrence 6 x 1 x     Labs  Results for PAYETON, GERMANI (MRN [id number]) as of 12/06/2014 08:55  Ref. Range 12/06/2014 04:37  Sodium Latest Ref Range: 135-145 mmol/L 138  Potassium Latest Ref Range: 3.5-5.1 mmol/L 3.8  Chloride Latest Ref Range: 101-111 mmol/L 108  CO2 Latest Ref Range: 22-32 mmol/L 23  BUN Latest Ref Range: 6-20 mg/dL 5 (L)  Creatinine Latest Ref Range: 0.44-1.00 mg/dL 0.36 (L)  Calcium Latest Ref Range: 8.9-10.3 mg/dL 8.4 (L)  EGFR (Non-African Amer.) Latest Ref Range: >60 mL/min >60  EGFR (African American) Latest Ref Range: >60 mL/min >60  Glucose Latest Ref Range: 65-99 mg/dL 110 (H)  Anion gap Latest Ref Range: 5-15  7  Results for AAIRA, OESTREICHER (MRN [id number]) as of 12/06/2014 08:55  Ref. 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 R tibia nonunion s/p IMN  TDWB R leg  Unrestricted knee and ankle ROM   Ice and elevate  PT/OT evals  Dressing change tomorrow   2. Pain management:  Complex issue    Dc dilaudid pca and start fentanyl PCA   Percocet 10/325 1-2 po q6h prn pain   Oxy ir 5-15 mg po q3h prn severe breakthrough pain   Fentanyl 50 mcg IV q2h prn severe uncontrolled pain   Ativan 1 mg po q6h prn anxiety/spasms    Gabapentin 600 mg po q6h and then 900 mg po before bed    I believe part of the issue was that pt did not take much in  terms of pain medications while her block was effective and as such are trying to catch up now   May also need to consider changing neuropathic agent from gabapentin to lyrica, can discuss this with Dr. Isaiah Blakes. Pt will need to be tapered off gabapentin if she is changed to lyrica   3. ABL anemia/Hemodynamics  stable  4. DVT/PE prophylaxis:  Lovenox   ASA 325 mg po BID at dc   5. ID:   periop abx completed   6. Metabolic Bone Disease:  Vitamin D levels look good  Bone quality not optimal clinically   7. Activity:  OOB as tolerated  TDWDB R leg  8. FEN/Foley/Lines:  Diet as tolerated   9. Dispo:  Optimize pain control  Possible dc home tomorrow    Jari Pigg, PA-C [facility] [phone] (O) 12/06/2014 8:49 AM

## 2014-12-06 NOTE — Op Note (Signed)
April [name]              ACCOUNT NO.:  [id number]  MEDICAL RECORD NO.:  [id number]  LOCATION:  5N24C                        FACILITY:  [facility]  PHYSICIAN:  [name]. Marcelino Scot, M.D. DATE OF BIRTH:  April 05, 1971  DATE OF PROCEDURE:  12/05/2014 DATE OF DISCHARGE:                              OPERATIVE REPORT   PREOPERATIVE DIAGNOSES: 1. Right tibial osteotomy nonunion. 2. Broken hardware.  POSTOPERATIVE DIAGNOSES: 1. Right tibial osteotomy nonunion. 2. Broken hardware.  PROCEDURES: 1. Intramedullary nailing of the right tibia using a Biomet VersaNail     80 x 330 mm statically locked with blocking screws. 2. Repair of tibial nonunion with autograft. 3. Removal of tibial hardware, right.   SURGEON:  [name]. Marcelino Scot, M.D.  ASSISTANT:  Jari Pigg, PA-C.  ANESTHESIA:  General with regional blocks x2.  COMPLICATIONS:  None.  TOURNIQUET:  None.  SPECIMENS:  Two anaerobic, aerobic sent from the tibia nonunion site to micro.  DISPOSITION:  To PACU.  CONDITION:  Stable  BRIEF SUMMARY OF INDICATIONS FOR PROCEDURE:  April [name] is a 44 year old female status post multiple surgeries on her right tibia due to nonunion.  Her course has been complicated by complex regional pain syndrome.  Most recently, she sustained fracture of her hardware and now presents for repair of her nonunion as well as removal of the broken implants.  I discussed with her the risks and benefits of surgery including the possibility of persistent nonunion, infection, nerve injury, vessel injury, DVT, PE, and multiple others.  She acknowledged these risks and did wish to proceed.  We also specifically discussed exacerbation of her neuralgia.  BRIEF SUMMARY OF PROCEDURE:  April [name] was taken to the operating room after administration of IV antibiotics.  Her right lower extremity was prepped and draped in usual sterile fashion.  No tourniquet was used during the procedure.  The medial incision  was remade after time-out. Dissection was carried down to the plate.  The intact screws were removed and then the plate, the broken screws were then removed in stepwise fashion using a trephine and easy out reverse threaded removal devices and multiple others.  Ultimately, we were successful, but this required considerable operative time and fluoro because of the multitude of broken screws with 4 distally and 2 proximally.  Following this, the wounds were irrigated thoroughly.  The nonunion site was mobile and fibrinous material was taken from this and sent for anaerobic and aerobic culture.  The knee was then incised at 2 cm to enable introduction of the curved cannulated awl behind the patellar tendon, but outside the knee joint beginning just medial to lateral tibial spine, advanced in center-center position of the proximal tibia.  The ball-tipped guidewire was then advanced into the tibia and down to the osteotomy site.  Using a reduction tenaculum on the distal fragment and a ball spike pusher on the front, I was able to reduce the tibia and then advance the guide pin across into the distal tibia and down in the center of the tibial plafond.  We did not remove the remaining distal aspect of anterior to posterior distal lock that was from her initial surgery.  I was  careful to hold the tibia in a reduced position requiring both the tenaculum and ball spike pusher while my assistant, Ainsley Spinner, PA-C did sequentially ream from 8 to 9 mm.  We encountered chatter almost right away and her canal was quite small.  The nail was then placed and alignment was checked on multiple views and it was not adequate.  Consequently, I removed the nail and placed 2 blocking screws in the distal fragment to produce proper alignment.  It was then re-reamed with the 8, 8.5, and 9 mm reamers and the nail inserted being careful to check that there was no fracture produced.  The screws placed were 4 mm fully  threaded non- cannulated titanium screws.  This resulted in excellent restoration of anatomic alignment and we were able to use perfect circle technique to place 2 locked distally guide and placed 2 locks proximally.  Wounds were irrigated.  Through this process we generated a significant amount of autograft that I was Careful to produce, collect, and place in and around the  nonunion site.  In addition to the autograft, I also placed an Infuse sponge over top of this given her multiple prior procedures and the biologic need/ obtastacles to finally obtain union.  Vicryl was used deep and then 2-0 Vicryl, 3-0 nylon for the skin with vertical mattress sutures because of her multiple procedures as well.  Sterile gently compressive dressing was applied and then a Multi-Podus boot.  Ainsley Spinner, PA-C did assist me throughout.  The patient was awakened from anesthesia and transported to the PACU in stable condition.  PROGNOSIS:  We are hopeful that April [name] will enjoy pain relief with her regional blocks and 2-day stay is anticipated with unrestricted range of motion of the knee and ankle and PT assistance.  She will be nonweightbearing and will resume weightbearing likely at the 6 to 8 week mark depending on physical examination and x-rays.     [name]. Marcelino Scot, M.D.     MHH/MEDQ  D:  12/05/2014  T:  12/06/2014  Job:  [id number]

## 2014-12-06 NOTE — Progress Notes (Signed)
Initial Nutrition Assessment  DOCUMENTATION CODES:   Underweight  INTERVENTION:   Provide nourishment snacks (yogurt). Ordered.  Encourage adequate PO intake.   NUTRITION DIAGNOSIS:   Inadequate oral intake related to poor appetite as evidenced by per patient/family report.  GOAL:   Patient will meet greater than or equal to 90% of their needs  MONITOR:   PO intake, Weight trends, Labs, I & O's  REASON FOR ASSESSMENT:   Malnutrition Screening Tool    ASSESSMENT:   S/p remote IMN with nonunion and broken hardware, which underwent successful nonunion repair in situ, but then developed fibula stress fracture which required tibial osteotomy. She presented to the office for workup which demonstrated a broken hardware and persistent nonunion. Pt for hardware removal and IM nailing of her right tibia. PMH COPD, GERD, asthma, IBS.   PROCEDURE (7/14): 1. INTRAMEDULLARY (IM) NAIL RIGHT TIBIAL (Right), BIOMET Versanail 8 x 330 statically locked, with blocking screws 2. REPAIR OF TIBIAL NONUNION WITH AUTOGRAFT 3. HARDWARE REMOVAL RIGHT TIBIAL (Right)  Pt reports having a decreased appetite due to pain. No percent meal completion recorded, however pt reports meal completion has been poor. Pt reports PTA, she has been consuming 1-2 meals a day, however has been very varied. Per Epic weight records, pt with a 9.4% weight loss in 5 months. Pt was offered nutritional supplements, however pt refused reporting she does not like the taste. Pt is agreeable to nourishment snacks. RD to order. Pt was encouraged to eat her food at meals. Emphasized adequate protein intake for healing.  Unable to perform nutrition focused physical exam at this time as pt was very fatigued and started to become agitated.   Labs: Low calcium, BUN, and creatinine.   Diet Order:  Diet regular Room service appropriate?: Yes; Fluid consistency:: Thin  Skin:   (Incision on R leg, non-pitting RLE edema)  Last BM:   7/13  Height:   Ht Readings from Last 1 Encounters:  12/05/14 5\' 7"  (1.702 m)    Weight:   Wt Readings from Last 1 Encounters:  12/05/14 115 lb (52.164 kg)    Ideal Body Weight:  61 kg  Wt Readings from Last 10 Encounters:  12/05/14 115 lb (52.164 kg)  07/16/14 127 lb (57.607 kg)  03/26/14 126 lb 14.4 oz (57.561 kg)    BMI:  Body mass index is 18.01 kg/(m^2). Underweight  Estimated Nutritional Needs:   Kcal:  1700-1900  Protein:  75-90 grams  Fluid:  1.7 - 1.9 L/day  EDUCATION NEEDS:   Education needs addressed  Corrin Parker, MS, RD, LDN Pager # [phone] After hours/ weekend pager # [phone]

## 2014-12-06 NOTE — Discharge Instructions (Signed)
[facility] Discharge Instructions   General Discharge Instructions  WEIGHT BEARING STATUS: Touchdown weightbearing Right Leg  RANGE OF MOTION/ACTIVITY: Range of motion as tolerated R knee and ankle  PAIN MEDICATION USE AND EXPECTATIONS  You have likely been given narcotic medications to help control your pain.  After a traumatic event that results in an fracture (broken bone) with or without surgery, it is ok to use narcotic pain medications to help control one's pain.  We understand that everyone responds to pain differently and each individual patient will be evaluated on a regular basis for the continued need for narcotic medications. Ideally, narcotic medication use should last no more than 6-8 weeks (coinciding with fracture healing).   As a patient it is your responsibility as well to monitor narcotic medication use and report the amount and frequency you use these medications when you come to your office visit.   We would also advise that if you are using narcotic medications, you should take a dose prior to therapy to maximize you participation.  IF YOU ARE ON NARCOTIC MEDICATIONS IT IS NOT PERMISSIBLE TO OPERATE A MOTOR VEHICLE (MOTORCYCLE/CAR/TRUCK/MOPED) OR HEAVY MACHINERY DO NOT MIX NARCOTICS WITH OTHER CNS (CENTRAL NERVOUS SYSTEM) DEPRESSANTS SUCH AS ALCOHOL  Diet: as you were eating previously.  Can use over the counter stool softeners and bowel preparations, such as Miralax, to help with bowel movements.  Narcotics can be constipating.  Be sure to drink plenty of fluids  Wound Care: Daily wound care starting on 12/09/2014. See instructions below  STOP SMOKING OR USING NICOTINE PRODUCTS!!!!  As discussed nicotine severely impairs your body's ability to heal surgical and traumatic wounds but also impairs bone healing.  Wounds and bone heal by forming microscopic blood vessels (angiogenesis) and nicotine is a vasoconstrictor (essentially, shrinks blood vessels).   Therefore, if vasoconstriction occurs to these microscopic blood vessels they essentially disappear and are unable to deliver necessary nutrients to the healing tissue.  This is one modifiable factor that you can do to dramatically increase your chances of healing your injury.    (This means no smoking, no nicotine gum, patches, etc)  DO NOT USE NONSTEROIDAL ANTI-INFLAMMATORY DRUGS (NSAID'S)  Using products such as Advil (ibuprofen), Aleve (naproxen), Motrin (ibuprofen) for additional pain control during fracture healing can delay and/or prevent the healing response.  If you would like to take over the counter (OTC) medication, Tylenol (acetaminophen) is ok.  However, some narcotic medications that are given for pain control contain acetaminophen as well. Therefore, you should not exceed more than 4000 mg of tylenol in a day if you do not have liver disease.  Also note that there are may OTC medicines, such as cold medicines and allergy medicines that my contain tylenol as well.  If you have any questions about medications and/or interactions please ask your doctor/PA or your pharmacist.      ICE AND ELEVATE INJURED/OPERATIVE EXTREMITY  Using ice and elevating the injured extremity above your heart can help with swelling and pain control.  Icing in a pulsatile fashion, such as 20 minutes on and 20 minutes off, can be followed.    Do not place ice directly on skin. Make sure there is a barrier between to skin and the ice pack.    Using frozen items such as frozen peas works well as the conform nicely to the are that needs to be iced.  USE AN ACE WRAP OR TED HOSE FOR SWELLING CONTROL  In addition to icing  and elevation, Ace wraps or TED hose are used to help limit and resolve swelling.  It is recommended to use Ace wraps or TED hose until you are informed to stop.    When using Ace Wraps start the wrapping distally (farthest away from the body) and wrap proximally (closer to the body)   Example: If you  had surgery on your leg or thing and you do not have a splint on, start the ace wrap at the toes and work your way up to the thigh        If you had surgery on your upper extremity and do not have a splint on, start the ace wrap at your fingers and work your way up to the upper arm  IF YOU ARE IN A SPLINT OR CAST DO NOT [place]   If your splint gets wet for any reason please contact the office immediately. You may shower in your splint or cast as long as you keep it dry.  This can be done by wrapping in a cast cover or garbage back (or similar)  Do Not stick any thing down your splint or cast such as pencils, money, or hangers to try and scratch yourself with.  If you feel itchy take benadryl as prescribed on the bottle for itching  IF YOU ARE IN A CAM BOOT (BLACK BOOT)  You may remove boot periodically. Perform daily dressing changes as noted below.  Wash the liner of the boot regularly and wear a sock when wearing the boot. It is recommended that you sleep in the boot until told otherwise  CALL THE OFFICE WITH ANY QUESTIONS OR CONCERTS: [phone]     Discharge Pin Site Instructions  Dress pins daily with Kerlix roll starting on POD 2. Wrap the Kerlix so that it tamps the skin down around the pin-skin interface to prevent/limit motion of the skin relative to the pin.  (Pin-skin motion is the primary cause of pain and infection related to external fixator pin sites).  Remove any crust or coagulum that may obstruct drainage with a saline moistened gauze or soap and water.  After POD 3, if there is no discernable drainage on the pin site dressing, the interval for change can by increased to every other day.  You may shower with the fixator, cleaning all pin sites gently with soap and water.  If you have a surgical wound this needs to be completely dry and without drainage before showering.  The extremity can be lifted by the fixator to facilitate wound care and  transfers.  Notify the office/Doctor if you experience increasing drainage, redness, or pain from a pin site, or if you notice purulent (thick, snot-like) drainage.  Discharge Wound Care Instructions  Do NOT apply any ointments, solutions or lotions to pin sites or surgical wounds.  These prevent needed drainage and even though solutions like hydrogen peroxide kill bacteria, they also damage cells lining the pin sites that help fight infection.  Applying lotions or ointments can keep the wounds moist and can cause them to breakdown and open up as well. This can increase the risk for infection. When in doubt call the office.  Surgical incisions should be dressed daily.  If any drainage is noted, use one layer of adaptic, then gauze, Kerlix, and an ace wrap.  Once the incision is completely dry and without drainage, it may be left open to air out.  Showering may begin 36-48 hours later.  Cleaning  gently with soap and water.  Traumatic wounds should be dressed daily as well.    One layer of adaptic, gauze, Kerlix, then ace wrap.  The adaptic can be discontinued once the draining has ceased    If you have a wet to dry dressing: wet the gauze with saline the squeeze as much saline out so the gauze is moist (not soaking wet), place moistened gauze over wound, then place a dry gauze over the moist one, followed by Kerlix wrap, then ace wrap.

## 2014-12-06 NOTE — Progress Notes (Signed)
Patient declined am vital signs.  Will continue to monitor HR and SPO2 using pulse ox, currently stable.  Patient indicates that pain remains uncontrolled.  Patient would like to speak directly to PA/MD regarding pain management regimen, patient informed PA or MD will round in the morning.

## 2014-12-06 NOTE — Progress Notes (Signed)
Patient states that her block has worn off and her pain is 10/10.  Patient requested that the MD be called because the PCA pump "is not working," patient is crying.  On call, Dr. Mardelle Matte contacted.  New orders for 5 mg Valium PO once and 1 mg Dilaudid IV once.  Patient's VSS, will continue to monitor.

## 2014-12-06 NOTE — Evaluation (Signed)
Occupational Therapy Evaluation Patient Details Name: April [name] MRN: [id number] DOB: [birth year] Today's Date: 12/06/2014    History of Present Illness 44 year old white female well known to the orthopedic trauma service for right distal tibia fracture which developed a nonunion. She has undergone numerous procedures to her right tibia most recent one included plate osteosynthesis with ICBG. Patient had been doing well following the surgery however she had been having some issues regarding her pain. She is referred to outpatient anesthesiology where she was getting selective nerve hardware doing quite well. About a week ago she felt a pop in her right leg. She presented to the office for workup which demonstrated a broken hardware and persistent nonunion. She presents today for hardware removal and IM nailing of her right tibia.   Clinical Impression   Pt currently still with significant pain in the RLE but able to tolerate simulated toilet transfers, mobility in the hall (per her request) and transfer to bedside chair.  She is familiar with walker usage from previous sx and uses safety while maintaining TDWBing status.  Pt is modified independent for all selfcare tasks at this time and has all needed OT DME.  No further needs.     Follow Up Recommendations  No OT follow up    Equipment Recommendations  None recommended by OT    Recommendations for Other Services       Precautions / Restrictions Precautions Precautions: Fall Restrictions Weight Bearing Restrictions: Yes RLE Weight Bearing: Touchdown weight bearing      Mobility Bed Mobility Overal bed mobility: Modified Independent             General bed mobility comments: Pt able to transition from supine to EOB with modified independent   Transfers Overall transfer level: Modified independent Equipment used: Rolling walker (2 wheeled)                  Balance Overall balance assessment: Modified  Independent                                          ADL Overall ADL's : Modified independent                                       General ADL Comments: Pt has a tub bench and was utilizing a walker back on her borrowed walker     Vision Vision Assessment?: No apparent visual deficits   Perception Perception Perception Tested?: No   Praxis Praxis Praxis tested?: Within functional limits    Pertinent Vitals/Pain Pain Assessment: 0-10 Pain Score: 8  Pain Location: right ankle Pain Descriptors / Indicators: Aching;Constant Pain Intervention(s): PCA encouraged     Hand Dominance Right   Extremity/Trunk Assessment Upper Extremity Assessment Upper Extremity Assessment: Overall WFL for tasks assessed       Cervical / Trunk Assessment Cervical / Trunk Assessment: Normal   Communication Communication Communication: No difficulties   Cognition Arousal/Alertness: Awake/alert Behavior During Therapy: WFL for tasks assessed/performed Overall Cognitive Status: Within Functional Limits for tasks assessed                                Home Living Family/patient expects to be discharged to:: Private residence Living Arrangements: Children (daughter  lives with her but is pregnant) Available Help at Discharge: Family;Available 24 hours/day (Daughter cannot provide a lot of physical assist) Type of Home: House Home Access: Level entry     Home Layout: One level     Bathroom Shower/Tub: Tub/shower unit;Curtain   Biochemist, clinical: Standard     Home Equipment: Crutches;Shower seat          Prior Functioning/Environment Level of Independence: Independent             OT Diagnosis:                            End of Session Equipment Utilized During Treatment: Oxygen;Rolling walker (RLE PRAFO) Nurse Communication: Mobility status  Activity Tolerance: Patient tolerated treatment well Patient left: in  chair;with call bell/phone within reach   Time: 1024-1103 OT Time Calculation (min): 39 min Charges:  OT General Charges $OT Visit: 1 Procedure OT Evaluation $Initial OT Evaluation Tier I: 1 Procedure OT Treatments $Self Care/Home Management : 23-37 mins  Jasilyn Holderman OTR/L 12/06/2014, 11:15 AM

## 2014-12-07 LAB — CBC
HCT: 30 % — ABNORMAL LOW (ref 36.0–46.0)
HEMOGLOBIN: 10.2 g/dL — AB (ref 12.0–15.0)
MCH: 31.4 pg (ref 26.0–34.0)
MCHC: 34 g/dL (ref 30.0–36.0)
MCV: 92.3 fL (ref 78.0–100.0)
Platelets: 357 10*3/uL (ref 150–400)
RBC: 3.25 MIL/uL — ABNORMAL LOW (ref 3.87–5.11)
RDW: 13.8 % (ref 11.5–15.5)
WBC: 8.1 10*3/uL (ref 4.0–10.5)

## 2014-12-07 LAB — BASIC METABOLIC PANEL
Anion gap: 7 (ref 5–15)
BUN: <5 mg/dL — ABNORMAL LOW (ref 6–20)
CALCIUM: 8.3 mg/dL — AB (ref 8.9–10.3)
CO2: 25 mmol/L (ref 22–32)
CREATININE: 0.56 mg/dL (ref 0.44–1.00)
Chloride: 103 mmol/L (ref 101–111)
GFR calc Af Amer: >60 mL/min (ref >60)
GFR calc non Af Amer: >60 mL/min (ref >60)
GLUCOSE: 117 mg/dL — AB (ref 65–99)
Potassium: 3.7 mmol/L (ref 3.5–5.1)
Sodium: 135 mmol/L (ref 135–145)

## 2014-12-07 NOTE — Progress Notes (Signed)
PT Cancellation Note  Patient Details Name: April [name] MRN: [id number] DOB: 11-09-1970   Cancelled Treatment:    Reason Eval/Treat Not Completed: Pain limiting ability to participate (Pt refuses to work w/ therapy 2/2 pain).  Reports she has gotten OOB several times this morning w/ nurse tech and does not want to get up again today. PT will continue to follow acutely.   Joslyn Hy PT, DPT [phone] Pager: [phone] 12/07/2014, 12:40 PM

## 2014-12-07 NOTE — Progress Notes (Signed)
     Subjective:  Patient reports pain as marked.  She says this is the most painful surgery she has been through, she is having pain in her knee as well as her ankle. She is on IV PCA as well as oral narcotics.  Objective:   VITALS:   Filed Vitals:   12/07/14 0500 12/07/14 0621 12/07/14 0747 12/07/14 0748  BP: 112/59     Pulse: 89     Temp: 98.4 F (36.9 C)     TempSrc: Oral     Resp: 16 16 12 12   Height:      Weight:      SpO2: 100% 99% 98% 99%    She has extreme hypersensitivity throughout her foot and leg. Soft tissue swelling is mild to moderate. Her dressings were changed and her wounds are all clean and dry and I placed a nonstick dressing in place. She has extreme difficulty lifting the leg, and assessment of sensation and motor in the foot is extremely limited secondary to her pain and anxiety and reluctance to move. I do not appreciate any evidence for compartment syndrome likely, her compartments appear soft and the leg is not significantly swollen.  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   Shaivi Rothschild P 12/07/2014, 10:16 AM   Marchia Bond, MD Cell [phone]

## 2014-12-08 LAB — BASIC METABOLIC PANEL
ANION GAP: 7 (ref 5–15)
BUN: <5 mg/dL — ABNORMAL LOW (ref 6–20)
CALCIUM: 8.4 mg/dL — AB (ref 8.9–10.3)
CO2: 26 mmol/L (ref 22–32)
CREATININE: 0.56 mg/dL (ref 0.44–1.00)
Chloride: 104 mmol/L (ref 101–111)
Glucose, Bld: 122 mg/dL — ABNORMAL HIGH (ref 65–99)
Potassium: 4 mmol/L (ref 3.5–5.1)
SODIUM: 137 mmol/L (ref 135–145)

## 2014-12-08 LAB — TISSUE CULTURE
CULTURE: NO GROWTH
GRAM STAIN: NONE SEEN

## 2014-12-08 NOTE — Progress Notes (Signed)
     Subjective:  Patient reports pain as severe.  She reports episodes of spasms where she goes into uncontrollable jerking of her right ankle. This lasts for 2-5 minutes at a time. This occurred while I was in the room, and she was writhing in pain.  Objective:   VITALS:   Filed Vitals:   12/07/14 1557 12/07/14 2156 12/08/14 0010 12/08/14 0505  BP:  117/59 119/56 110/57  Pulse:  87 80 85  Temp:  99.3 F (37.4 C) 98.7 F (37.1 C) 98.6 F (37 C)  TempSrc:  Oral Oral Oral  Resp: 22 16 14 14   Height:      Weight:      SpO2: 98% 99% 99% 99%    Dorsiflexion/Plantar flexion intact Her dressing is clean, Ace wrap's intact, she reports absent sensation throughout her toes which is at her baseline, dorsiflexion and plantarflexion of the ankle and toes are intact.  As indicated above, she had an episode of what appeared to be "involuntary" tonic contracture of the right ankle with dorsiflexion and plantar flexion that was repeated as she gripped the bed with her hands and grimaced in pain. She had no other indications of seizure activity, and was cognitively intact throughout the episode.  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 Marcelino Scot, I'm not sure she  is a candidate for inpatient rehabilitation, versus skilled nursing placement versus discharge home. She has not been able to do much with physical therapy so far.   Rosalinda Seaman P 12/08/2014, 10:03 AM   Marchia Bond, MD Cell [phone]

## 2014-12-08 NOTE — Progress Notes (Signed)
Physical Therapy Treatment Patient Details Name: April [name] MRN: [id number] DOB: Jun 14, 1970 Today's Date: 12/08/2014    History of Present Illness 44 year old white female well known to the orthopedic trauma service for right distal tibia fracture which developed a nonunion. She has undergone numerous procedures to her right tibia most recent one included plate osteosynthesis with ICBG. Patient had been doing well following the surgery however she had been having some issues regarding her pain. She is referred to outpatient anesthesiology where she was getting selective nerve hardware doing quite well. About a week ago she felt a pop in her right leg. She presented to the office for workup which demonstrated a broken hardware and persistent nonunion. She presents today for hardware removal and IM nailing of her right tibia.    PT Comments    Pt has made limited progress w/ therapy as she continues to work w/ PT 2/2 pain and agitation (saying, "I know how to get around on my own").  However, pt is impulsive and moves quickly, making her unsafe to mobilize w/o supervision.  Pt will not have assist at home and may need to consider SNF placement if she cannot find anyone to assist at home.  Pt will benefit from continued skilled PT services to increase functional independence and safety.   Follow Up Recommendations  Supervision for mobility/OOB;Outpatient PT (OPPT when appropriate.  SNF if pt cannot find assist )     Equipment Recommendations  Rolling walker with 5" wheels;Wheelchair (measurements PT);Wheelchair cushion (measurements PT)    Recommendations for Other Services       Precautions / Restrictions Precautions Precautions: Fall Precaution Comments: Pt impulsive  Restrictions Weight Bearing Restrictions: Yes RLE Weight Bearing: Touchdown weight bearing    Mobility  Bed Mobility Overal bed mobility: Modified Independent             General bed mobility comments: Mod I  w/ increased time sit>supine.  Pt does not want help as says, "I have figued out how to do all of this on my own"  Transfers Overall transfer level: Needs assistance Equipment used: Rolling walker (2 wheeled) Transfers: Sit to/from Stand Sit to Stand: Supervision         General transfer comment: Supervision for safety as pt is impulsive and moves quickly.  Cues to take her time to prevent fall.  Ambulation/Gait Ambulation/Gait assistance: Supervision Ambulation Distance (Feet): 10 Feet Assistive device: Rolling walker (2 wheeled) Gait Pattern/deviations: Step-to pattern;Antalgic   Gait velocity interpretation: Below normal speed for age/gender General Gait Details: Pt moves quickly and is impulsive.  Pt refuses to wear grip socks but pt educated on importance of their use or a shoe and pt dons sock prior to ambulation.     Stairs            Wheelchair Mobility    Modified Rankin (Stroke Patients Only)       Balance Overall balance assessment: Needs assistance Sitting-balance support: No upper extremity supported;Feet supported Sitting balance-Leahy Scale: Good     Standing balance support: During functional activity;Single extremity supported Standing balance-Leahy Scale: Poor Standing balance comment: relies on RW.  Holds onto sink while brushing teeth                    Cognition Arousal/Alertness: Awake/alert Behavior During Therapy: Agitated;Impulsive Overall Cognitive Status: Within Functional Limits for tasks assessed  Exercises      General Comments        Pertinent Vitals/Pain Pain Assessment: 0-10 Pain Score:  ("12/10") Pain Location: R ankle Pain Descriptors / Indicators: Aching;Throbbing Pain Intervention(s): Limited activity within patient's tolerance;Monitored during session;Repositioned    Home Living                      Prior Function            PT Goals (current goals can now be  found in the care plan section) Acute Rehab PT Goals Patient Stated Goal: to have her pain go away, to go home and spend time with her daughter who is pregnant PT Goal Formulation: With patient Time For Goal Achievement: 12/13/14 Potential to Achieve Goals: Good Progress towards PT goals: Not progressing toward goals - comment (Pt continues to refuse therapy 2/2 pain and agitation)    Frequency  Min 5X/week    PT Plan Current plan remains appropriate    Co-evaluation             End of Session   Activity Tolerance: Patient limited by pain Patient left: Other (comment);with nursing/sitter in room (in bathroom w/ nurse tech)     Time: 1219-7588 PT Time Calculation (min) (ACUTE ONLY): 18 min  Charges:  $Gait Training: 8-22 mins                    G Codes:      Joslyn Hy PT, [place] [phone] Pager: [phone] 12/08/2014, 3:09 PM

## 2014-12-09 MED ORDER — AMITRIPTYLINE HCL 50 MG PO TABS: 75.0000 mg | ORAL_TABLET | Freq: Every day | ORAL | Status: DC

## 2014-12-09 MED ORDER — AMITRIPTYLINE HCL 50 MG PO TABS
50.0000 mg | ORAL_TABLET | Freq: Every day | ORAL | Status: DC
  Administered 2014-12-09: 50 mg via ORAL
  Filled 2014-12-09: qty 1

## 2014-12-09 MED ORDER — OXYCODONE HCL 5 MG PO TABS
5.0000 mg | ORAL_TABLET | ORAL | Status: DC | PRN
  Administered 2014-12-09 – 2014-12-10 (×6): 5 mg via ORAL
  Filled 2014-12-09 (×6): qty 1

## 2014-12-09 MED ORDER — OXYCODONE HCL 5 MG PO TABS
5.0000 mg | ORAL_TABLET | Freq: Once | ORAL | Status: AC
  Administered 2014-12-09: 10 mg via ORAL
  Filled 2014-12-09: qty 2

## 2014-12-09 MED ORDER — WHITE PETROLATUM GEL
Status: AC
  Administered 2014-12-09: 02:00:00
  Filled 2014-12-09: qty 1

## 2014-12-09 MED ORDER — OXYCODONE-ACETAMINOPHEN 5-325 MG PO TABS
1.0000 | ORAL_TABLET | ORAL | Status: DC | PRN
  Administered 2014-12-09 – 2014-12-10 (×6): 1 via ORAL
  Filled 2014-12-09 (×6): qty 1

## 2014-12-09 NOTE — Progress Notes (Signed)
Physical Therapy Treatment Patient Details Name: Collins Dimaria MRN: [id number] DOB: 03-03-1971 Today's Date: 12/09/2014    History of Present Illness 44 year old white female well known to the orthopedic trauma service for right distal tibia fracture which developed a nonunion. She has undergone numerous procedures to her right tibia most recent one included plate osteosynthesis with ICBG. Patient had been doing well following the surgery however she had been having some issues regarding her pain. She is referred to outpatient anesthesiology where she was getting selective nerve hardware doing quite well. About a week ago she felt a pop in her right leg. She presented to the office for workup which demonstrated a broken hardware and persistent nonunion. She presents today for hardware removal and IM nailing of her right tibia.    PT Comments    Pt is impulsive and does not wait for commands, but once up she is safe with mobility and use of RW/TDWB. Agitated about her situation and what she has been through. States she has a 67 Y/O daughter at home who is pregnant and will be able to get her food but no heavy lifting. Continue with current POC.  Follow Up Recommendations  Supervision for mobility/OOB;Outpatient PT     Equipment Recommendations  Rolling walker with 5" wheels;Wheelchair (measurements PT);Wheelchair cushion (measurements PT)    Recommendations for Other Services       Precautions / Restrictions Precautions Precautions: Fall Precaution Comments: Pt impulsive  Restrictions RLE Weight Bearing: Touchdown weight bearing    Mobility  Bed Mobility Overal bed mobility: Modified Independent             General bed mobility comments: Increase time sit>supine. Doesn't want help.  Transfers Overall transfer level: Needs assistance Equipment used: Rolling walker (2 wheeled) Transfers: Sit to/from Stand Sit to Stand: Supervision         General transfer comment:  Supervision for safety. Pt impulsive and does not wait for commands.  Ambulation/Gait Ambulation/Gait assistance: Supervision Ambulation Distance (Feet): 100 Feet Assistive device: Rolling walker (2 wheeled) Gait Pattern/deviations: Step-to pattern;Antalgic   Gait velocity interpretation: Below normal speed for age/gender General Gait Details: Pt continues to refuse grip socks. Impulsive during ambulation with supervision for safety.   Stairs            Wheelchair Mobility    Modified Rankin (Stroke Patients Only)       Balance                                    Cognition Arousal/Alertness: Awake/alert Behavior During Therapy: Agitated;Impulsive Overall Cognitive Status: Within Functional Limits for tasks assessed                      Exercises      General Comments        Pertinent Vitals/Pain Pain Assessment: 0-10 Pain Score: 10-Worst pain ever Pain Location: R ankle Pain Descriptors / Indicators: Aching;Sore;Throbbing Pain Intervention(s): Limited activity within patient's tolerance;Monitored during session;Repositioned    Home Living                      Prior Function            PT Goals (current goals can now be found in the care plan section) Progress towards PT goals: Progressing toward goals    Frequency  Min 5X/week    PT Plan Current plan remains  appropriate    Co-evaluation             End of Session Equipment Utilized During Treatment: Gait belt Activity Tolerance: Patient limited by pain Patient left: in bed;with call bell/phone within reach     Time: 6568-1275 PT Time Calculation (min) (ACUTE ONLY): 20 min  Charges:                       G CodesAllyn Kenner, SPTA December 17, 2014, 1:47 PM

## 2014-12-09 NOTE — Progress Notes (Signed)
Orthopaedic Trauma Service Progress Note  Subjective  Doing better this am Still requiring IV pain meds to get relief Still with significant neuropathic pain as well No CP No SOB  Has been out of bed a few times this weekend  Prefers to dc home once able to do so   Review of Systems  Constitutional: Negative for fever and chills.  Eyes: Negative for blurred vision and double vision.  Respiratory: Negative for shortness of breath and wheezing.   Cardiovascular: Negative for chest pain and palpitations.  Gastrointestinal: Negative for nausea, vomiting and abdominal pain.  Genitourinary: Negative for dysuria.  Neurological: Negative for headaches.     Objective   BP 128/71 mmHg  Pulse 82  Temp(Src) 98.2 F (36.8 C) (Oral)  Resp 14  Ht 5\' 7"  (1.702 m)  Wt 52.164 kg (115 lb)  BMI 18.01 kg/m2  SpO2 99%  Intake/Output      07/17 0701 - 07/18 0700 07/18 0701 - 07/19 0700   P.O. 1120    I.V. (mL/kg) 1250 (24)    Total Intake(mL/kg) 2370 (45.4)    Urine (mL/kg/hr) 200 (0.2)    Total Output 200     Net +2170          Urine Occurrence 4 x      Labs  Intra-op cultures: NGTD  Exam  Gen: awake and alert, currently comfortable, NAD Lungs:  Clear anterior fields Cardiac: s1 and s2, RRR Abd: NTND, + BS Ext:       Right Lower Extremity             Dressing stable             Some drainage through ace but this has been reinforced             DPN, SPN, TN sensation at baseline             EHL, FHL and lesser toe motor functions intact             Ext warm             + DP pulse             Swelling stable     Assessment and Plan   POD/HD#: 3   44 y/o female s/p IMN R tibia nonunion w/ removal of broken HW   1. R tibia nonunion s/p IMN             TDWB R leg             Unrestricted knee and ankle ROM               Ice and elevate             PT/OT evals             Dressing change tomorrow   2. Pain management:             Complex issue                dc fentanyl PCA   Fentanyl IV 50 mcg q2h prn severe breakthrough pain                         Percocet 10/325 1-2 po q4h prn pain- please make sure percocet is charted correctly and that the oxy ir is charted from the linked order not the separate oxy IR order  Oxy ir 5-15 mg po q3h prn severe breakthrough pain                         Ativan 1 mg po q6h prn anxiety/spasms                           Gabapentin 600 mg po q6h    Amitriptyline 50 mg po at bedtime    dc'd gabapentin 900 mg at bedtime  Dc orders for tylenol as pt will be at near max daily APAP dose taking percocet    3. ABL anemia/Hemodynamics             stable  4. DVT/PE prophylaxis:             Lovenox               ASA 325 mg po BID at dc   5. ID:               periop abx completed   6. Metabolic Bone Disease:             Vitamin D levels look good             Bone quality not optimal clinically   outpt dexa  We tried forteo after last surgery but pt did not tolerate at all     Checking rheumatologic labs to see if this may be a contributing component to pts intractable pain   7. Activity:             OOB as tolerated             TDWD R leg  8. FEN/Foley/Lines:             Diet as tolerated   9. Dispo:             Optimize pain control             Possible dc home tomorrow    Do not think pt will need SNF    Jari Pigg, PA-C [facility] [phone] (O) 12/09/2014 8:53 AM

## 2014-12-09 NOTE — Clinical Social Work Note (Signed)
CSW consulted regarding financial concerns at time of discharge.  CSW has made referral to [facility] Financial Counseling services.  CSW signing off. No further needs.  Lubertha Sayres, [place] Clinical Social Work Department Orthopedics [phone]) and Surgical [phone])

## 2014-12-10 ENCOUNTER — Encounter (HOSPITAL_COMMUNITY): Payer: Self-pay | Admitting: Orthopedic Surgery

## 2014-12-10 DIAGNOSIS — M792 Neuralgia and neuritis, unspecified: Secondary | ICD-10-CM

## 2014-12-10 DIAGNOSIS — G8929 Other chronic pain: Secondary | ICD-10-CM

## 2014-12-10 HISTORY — DX: Neuralgia and neuritis, unspecified: M79.2

## 2014-12-10 HISTORY — DX: Other chronic pain: G89.29

## 2014-12-10 LAB — HEPATIC FUNCTION PANEL
ALT: 15 U/L (ref 14–54)
AST: 23 U/L (ref 15–41)
Albumin: 3.2 g/dL — ABNORMAL LOW (ref 3.5–5.0)
Alkaline Phosphatase: 128 U/L — ABNORMAL HIGH (ref 38–126)
BILIRUBIN TOTAL: 0.3 mg/dL (ref 0.3–1.2)
Bilirubin, Direct: <0.1 mg/dL — ABNORMAL LOW (ref 0.1–0.5)
TOTAL PROTEIN: 6.7 g/dL (ref 6.5–8.1)

## 2014-12-10 LAB — PROTEIN ELECTROPHORESIS, SERUM
A/G Ratio: 0.9 (ref 0.7–1.7)
Albumin ELP: 3 g/dL (ref 2.9–4.4)
Alpha-1-Globulin: 0.4 g/dL (ref 0.0–0.4)
Alpha-2-Globulin: 1.1 g/dL — ABNORMAL HIGH (ref 0.4–1.0)
Beta Globulin: 1.1 g/dL (ref 0.7–1.3)
GLOBULIN, TOTAL: 3.5 g/dL (ref 2.2–3.9)
Gamma Globulin: 0.9 g/dL (ref 0.4–1.8)
Total Protein ELP: 6.5 g/dL (ref 6.0–8.5)

## 2014-12-10 LAB — VITAMIN D 1,25 DIHYDROXY
VITAMIN D 1, 25 (OH) TOTAL: 47 pg/mL
Vitamin D3 1, 25 (OH)2: 47 pg/mL

## 2014-12-10 LAB — ANAEROBIC CULTURE: GRAM STAIN: NONE SEEN

## 2014-12-10 LAB — ANTINUCLEAR ANTIBODIES, IFA: ANTINUCLEAR ANTIBODIES, IFA: NEGATIVE

## 2014-12-10 LAB — HEMOGLOBIN A1C
HEMOGLOBIN A1C: 5.5 % (ref 4.8–5.6)
Mean Plasma Glucose: 111 mg/dL

## 2014-12-10 LAB — RHEUMATOID FACTOR: Rhuematoid fact SerPl-aCnc: 9.1 IU/mL (ref 0.0–13.9)

## 2014-12-10 MED ORDER — HYDROMORPHONE HCL 2 MG PO TABS: 2.0000 mg | ORAL_TABLET | Freq: Three times a day (TID) | ORAL | Status: DC | PRN

## 2014-12-10 MED ORDER — HYDROMORPHONE HCL 2 MG PO TABS: 1.0000 mg | ORAL_TABLET | Freq: Three times a day (TID) | ORAL | Status: DC | PRN

## 2014-12-10 MED ORDER — GABAPENTIN 300 MG PO CAPS: 600.0000 mg | ORAL_CAPSULE | Freq: Four times a day (QID) | ORAL | Status: DC

## 2014-12-10 MED ORDER — HYDROMORPHONE HCL 2 MG PO TABS
1.0000 mg | ORAL_TABLET | Freq: Three times a day (TID) | ORAL | Status: DC | PRN
  Administered 2014-12-10: 2 mg via ORAL
  Filled 2014-12-10: qty 1

## 2014-12-10 MED ORDER — OXYCODONE-ACETAMINOPHEN 10-325 MG PO TABS: 1.0000 | ORAL_TABLET | ORAL | Status: DC | PRN

## 2014-12-10 MED ORDER — OXYCODONE HCL 5 MG PO TABS: 5.0000 mg | ORAL_TABLET | Freq: Four times a day (QID) | ORAL | Status: DC | PRN

## 2014-12-10 MED ORDER — AMITRIPTYLINE HCL 50 MG PO TABS: 50.0000 mg | ORAL_TABLET | Freq: Every day | ORAL | Status: DC
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
## 2014-12-10 NOTE — Progress Notes (Signed)
Pt ready for d/c per MD order. Prescriptions and discharge teaching given to pt, all questions answered. Equipment including walker and wheelchair were delivered to pts room. Peripheral IV removed, belongings gathered and removed by daughter.   Raquel James 12/10/2014 2:08 PM

## 2014-12-10 NOTE — Discharge Summary (Signed)
Orthopaedic Trauma Service (OTS)  Patient ID: April [name] MRN: [id number] DOB/AGE: 25-Sep-1970 44 y.o.  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 Marcelino Scot 1. Intramedullary nailing of the right tibia using a Biomet VersaNail     80 x 330 mm statically locked with blocking screws. 2. Repair of tibial nonunion with autograft. 3. Removal of tibial hardware, right.   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 Patient was found to have a broken hardware at her last office visit. She was taken to the OR on 12/05/2014 for the procedure noted above. Patient tolerated procedure well. She did have a preoperative block for pain control. Postoperatively patient was doing very well with excellent pain relief. Unfortunately her block walk around 0400 the following morning with significant rebound pain. Patient required IV pain medication including a PCA to help control her pain. She was initially started on Dilaudid PCA however this was not nearly as effective and we transitioned her to a fentanyl PCA which was adequate. Over the next several days we did have issues with pain control but were able to get the patient on a good regimen including Percocet 10/325, plain oxycodone, Robaxin, Ativan, gabapentin and amitriptyline. She was covered with Lovenox for DVT and PE prophylaxis. She did work well with physical therapy although some sessions were limited due to  her pain. Patient did not have any additional issues other than pain. On postoperative day #5 patient had adequate pain relief with oral pain medications and was able to be discharged home in stable condition. We did initiate a rheumatologic workup to see if this is contributing to her chronic pain. As of the time of discharge only her rheumatoid factor has come back which was within normal range. Her hemoglobin A1c and vitamin D levels were also normal as well.  Consults: None  Significant Diagnostic Studies: labs:   Results for LIBRADA, CASTRONOVO (MRN [id number]) as of 12/10/2014 12:23  Ref. Range 12/10/2014 04:35  Alkaline Phosphatase Latest Ref Range: 38-126 U/L 128 (H)  Albumin Latest Ref Range: 3.5-5.0 g/dL 3.2 (L)  AST Latest Ref Range: 15-41 U/L 23  ALT Latest Ref Range: 14-54 U/L 15  Total Protein Latest Ref Range: 6.5-8.1 g/dL 6.7  Bilirubin, Direct Latest Ref Range: 0.1-0.5 mg/dL <0.1 (L)  Indirect Bilirubin Latest Ref Range: 0.3-0.9 mg/dL NOT CALCULATED  Total Bilirubin Latest Ref Range: 0.3-1.2 mg/dL 0.3   Results for CHARLESETTA, MILLIRON (MRN [id number]) as of 12/10/2014 12:23  Ref. Range 12/09/2014 09:58  Hemoglobin A1C Latest Ref Range: 4.8-5.6 % 5.5  Rhuematoid fact SerPl-aCnc Latest Ref Range: 0.0-13.9 IU/mL 9.1  Results for JASPREET, BODNER (MRN [id number]) as of 12/10/2014 12:23  Ref. Range 12/07/2014 03:57  WBC Latest Ref Range: 4.0-10.5 K/uL 8.1  RBC Latest Ref Range: 3.87-5.11 MIL/uL 3.25 (L)  Hemoglobin Latest Ref Range: 12.0-15.0 g/dL 10.2 (L)  HCT Latest Ref Range: 36.0-46.0 % 30.0 (L)  MCV Latest Ref Range: 78.0-100.0 fL 92.3  MCH Latest Ref Range: 26.0-34.0  pg 31.4  MCHC Latest Ref Range: 30.0-36.0 g/dL 34.0  RDW Latest Ref Range: 11.5-15.5 % 13.8  Platelets Latest Ref Range: 150-400 K/uL 357   Results for RAKEB, KIBBLE (MRN [id number]) as of 12/10/2014 12:23  Ref. Range 12/05/2014 07:52  Vit D, 25-Hydroxy Latest Ref Range: 30.0-100.0 ng/mL 43.5   Treatments: IV  hydration, antibiotics: Ancef, analgesia: Dilaudid PCA which was then converted to fentanyl, Percocet, OxyIR, anticoagulation: LMW heparin and aspirin at discharge, therapies: PT, OT and RN and surgery: As above  Discharge Exam:               Orthopaedic Trauma Service Progress Note  Subjective  Much improved Ready to go home   No complaints   Amitriptyline worked well last night   Review of Systems  Constitutional: Negative for fever and chills.  Respiratory: Negative for shortness of breath and wheezing.   Cardiovascular: Negative for chest pain and palpitations.  Gastrointestinal: Negative for nausea, vomiting and abdominal pain.  Neurological: Positive for tingling and sensory change. Negative for headaches.        Baseline sensory changes in R leg from original accident      Objective   BP 106/57 mmHg  Pulse 81  Temp(Src) 97.9 F (36.6 C) (Oral)  Resp 17  Ht 5\' 7"  (1.702 m)  Wt 52.164 kg (115 lb)  BMI 18.01 kg/m2  SpO2 99%  Intake/Output       07/18 0701 - 07/19 0700 07/19 0701 - 07/20 0700    P.O. 240 240    I.V. (mL/kg)      Total Intake(mL/kg) 240 (4.6) 240 (4.6)    Urine (mL/kg/hr)      Total Output        Net +240 +240          Urine Occurrence 6 x 1 x      Labs  Results for DORSIE, SETHI (MRN [id number]) as of 12/10/2014 11:56   Ref. Range  12/10/2014 04:35   Alkaline Phosphatase  Latest Ref Range: 38-126 U/L  128 (H)   Albumin  Latest Ref Range: 3.5-5.0 g/dL  3.2 (L)   AST  Latest Ref Range: 15-41 U/L  23   ALT  Latest Ref Range: 14-54 U/L  15   Total Protein  Latest Ref Range: 6.5-8.1 g/dL  6.7   Bilirubin, Direct  Latest Ref Range: 0.1-0.5 mg/dL  <0.1 (L)   Indirect Bilirubin  Latest Ref Range: 0.3-0.9 mg/dL  NOT CALCULATED   Total Bilirubin  Latest Ref Range: 0.3-1.2 mg/dL  0.3    Results for EVANGELINE, UTLEY (MRN [id number]) as of 12/10/2014 11:56   Ref. Range  12/09/2014 09:58   Hemoglobin A1C  Latest Ref Range: 4.8-5.6 %  5.5   Rhuematoid  fact SerPl-aCnc  Latest Ref Range: 0.0-13.9 IU/mL  9.1    Exam  Gen: awake and alert, appears comfortable, good spirits   Lungs: unlabored Cardiac: RRR Ext:        Right Lower Extremity               Incisions look outstanding             Scant drainage distally, serous             No erythema or other signs of infection                      Mild swelling  Ext warm             + DP pulse             DPN, SPN, TN motor and sensory at baseline   Assessment and Plan   POD/HD#: 29   44 y/o female s/p IMN R tibia nonunion w/ removal of broken HW   1. R tibia nonunion s/p IMN             TDWB R leg             Unrestricted knee and ankle ROM               Ice and elevate             PT/OT evals             Dressing change tomorrow   2. Pain management:             Complex issue                dc pain med regimen                            Percocet 10/325 1-2 po q4-6h prn pain                         Oxy ir 5-10 mg po q3h prn severe breakthrough pain                         Robaxin [phone] mg po q6h prn spasms                          Gabapentin 600 mg po q6h                           Amitriptyline 50 mg po at bedtime                           Dilaudid 1mg  1-2 po q8h prn severe pain   3. ABL anemia/Hemodynamics             stable  4. DVT/PE prophylaxis:                          ASA 325 mg po BID at dc   5. ID:               periop abx completed   6. Metabolic Bone Disease:             Vitamin D levels look good             Bone quality not optimal clinically               outpt dexa             We tried forteo after last surgery but pt did not tolerate at all                            Checking rheumatologic labs to see if this may be a contributing component to pts intractable pain  RF normal                           ANA pending   7. Activity:             OOB as tolerated             TDWD R leg  8. FEN/Foley/Lines:              Diet as tolerated   9. Dispo:             dc home today             folow up in 10-14 days with ortho     Disposition: 01-Home or Self Care      Discharge Instructions    Care order/instruction    Complete by:  As directed   [facility]opaedic Trauma Service Discharge Instructions   General Discharge Instructions  WEIGHT BEARING STATUS: Touchdown weightbearing Right Leg  RANGE OF MOTION/ACTIVITY: Range of motion as tolerated R knee and ankle  PAIN MEDICATION USE AND EXPECTATIONS  You have likely been given narcotic medications to help control your pain.  After a traumatic event that results in an fracture (broken bone) with or without surgery, it is ok to use narcotic pain medications to help control one's pain.  We understand that everyone responds to pain differently and each individual patient will be evaluated on a regular basis for the continued need for narcotic medications. Ideally, narcotic medication use should last no more than 6-8 weeks (coinciding with fracture healing).   As a patient it is your responsibility as well to monitor narcotic medication use and report the amount and frequency you use these medications when you come to your office visit.   We would also advise that if you are using narcotic medications, you should take a dose prior to therapy to maximize you participation.  IF YOU ARE ON NARCOTIC MEDICATIONS IT IS NOT PERMISSIBLE TO OPERATE A MOTOR VEHICLE (MOTORCYCLE/CAR/TRUCK/MOPED) OR HEAVY MACHINERY DO NOT MIX NARCOTICS WITH OTHER CNS (CENTRAL NERVOUS SYSTEM) DEPRESSANTS SUCH AS ALCOHOL  Diet: as you were eating previously.  Can use over the counter stool softeners and bowel preparations, such as Miralax, to help with bowel movements.  Narcotics can be constipating.  Be sure to drink plenty of fluids  Wound Care: Daily wound care starting on 12/09/2014. See instructions below  STOP SMOKING OR USING NICOTINE PRODUCTS!!!!  As discussed nicotine severely  impairs your body's ability to heal surgical and traumatic wounds but also impairs bone healing.  Wounds and bone heal by forming microscopic blood vessels (angiogenesis) and nicotine is a vasoconstrictor (essentially, shrinks blood vessels).  Therefore, if vasoconstriction occurs to these microscopic blood vessels they essentially disappear and are unable to deliver necessary nutrients to the healing tissue.  This is one modifiable factor that you can do to dramatically increase your chances of healing your injury.    (This means no smoking, no nicotine gum, patches, etc)  DO NOT USE NONSTEROIDAL ANTI-INFLAMMATORY DRUGS (NSAID'S)  Using products such as Advil (ibuprofen), Aleve (naproxen), Motrin (ibuprofen) for additional pain control during fracture healing can delay and/or prevent the healing response.  If you would like to take over the counter (OTC) medication, Tylenol (acetaminophen) is ok.  However, some narcotic medications that are given for pain control contain acetaminophen as well. Therefore, you should not exceed more than 4000 mg of tylenol in a day if you do  not have liver disease.  Also note that there are may OTC medicines, such as cold medicines and allergy medicines that my contain tylenol as well.  If you have any questions about medications and/or interactions please ask your doctor/PA or your pharmacist.      ICE AND ELEVATE INJURED/OPERATIVE EXTREMITY  Using ice and elevating the injured extremity above your heart can help with swelling and pain control.  Icing in a pulsatile fashion, such as 20 minutes on and 20 minutes off, can be followed.    Do not place ice directly on skin. Make sure there is a barrier between to skin and the ice pack.    Using frozen items such as frozen peas works well as the conform nicely to the are that needs to be iced.  USE AN ACE WRAP OR TED HOSE FOR SWELLING CONTROL  In addition to icing and elevation, Ace wraps or TED hose are used to help limit  and resolve swelling.  It is recommended to use Ace wraps or TED hose until you are informed to stop.    When using Ace Wraps start the wrapping distally (farthest away from the body) and wrap proximally (closer to the body)   Example: If you had surgery on your leg or thing and you do not have a splint on, start the ace wrap at the toes and work your way up to the thigh        If you had surgery on your upper extremity and do not have a splint on, start the ace wrap at your fingers and work your way up to the upper arm  IF YOU ARE IN A SPLINT OR CAST DO NOT [place]   If your splint gets wet for any reason please contact the office immediately. You may shower in your splint or cast as long as you keep it dry.  This can be done by wrapping in a cast cover or garbage back (or similar)  Do Not stick any thing down your splint or cast such as pencils, money, or hangers to try and scratch yourself with.  If you feel itchy take benadryl as prescribed on the bottle for itching  IF YOU ARE IN A CAM BOOT (BLACK BOOT)  You may remove boot periodically. Perform daily dressing changes as noted below.  Wash the liner of the boot regularly and wear a sock when wearing the boot. It is recommended that you sleep in the boot until told otherwise  CALL THE OFFICE WITH ANY QUESTIONS OR CONCERTS: [phone]     Discharge Pin Site Instructions  Dress pins daily with Kerlix roll starting on POD 2. Wrap the Kerlix so that it tamps the skin down around the pin-skin interface to prevent/limit motion of the skin relative to the pin.  (Pin-skin motion is the primary cause of pain and infection related to external fixator pin sites).  Remove any crust or coagulum that may obstruct drainage with a saline moistened gauze or soap and water.  After POD 3, if there is no discernable drainage on the pin site dressing, the interval for change can by increased to every other day.  You may shower with the  fixator, cleaning all pin sites gently with soap and water.  If you have a surgical wound this needs to be completely dry and without drainage before showering.  The extremity can be lifted by the fixator to facilitate wound care and transfers.  Notify the office/Doctor  if you experience increasing drainage, redness, or pain from a pin site, or if you notice purulent (thick, snot-like) drainage.  Discharge Wound Care Instructions  Do NOT apply any ointments, solutions or lotions to pin sites or surgical wounds.  These prevent needed drainage and even though solutions like hydrogen peroxide kill bacteria, they also damage cells lining the pin sites that help fight infection.  Applying lotions or ointments can keep the wounds moist and can cause them to breakdown and open up as well. This can increase the risk for infection. When in doubt call the office.  Surgical incisions should be dressed daily.  If any drainage is noted, use one layer of adaptic, then gauze, Kerlix, and an ace wrap.  Once the incision is completely dry and without drainage, it may be left open to air out.  Showering may begin 36-48 hours later.  Cleaning gently with soap and water.  Traumatic wounds should be dressed daily as well.    One layer of adaptic, gauze, Kerlix, then ace wrap.  The adaptic can be discontinued once the draining has ceased    If you have a wet to dry dressing: wet the gauze with saline the squeeze as much saline out so the gauze is moist (not soaking wet), place moistened gauze over wound, then place a dry gauze over the moist one, followed by Kerlix wrap, then ace wrap.     Touch down weight bearing    Complete by:  As directed   Laterality:  right  Extremity:  Lower            Medication List    STOP taking these medications        enoxaparin 40 MG/0.4ML injection  Commonly known as:  LOVENOX     pregabalin 100 MG capsule  Commonly known as:  LYRICA      TAKE these medications         albuterol 108 (90 BASE) MCG/ACT inhaler  Commonly known as:  PROVENTIL HFA;VENTOLIN HFA  Inhale 2 puffs into the lungs every 6 (six) hours as needed for wheezing or shortness of breath.     amitriptyline 50 MG tablet  Commonly known as:  ELAVIL  Take 1 tablet (50 mg total) by mouth at bedtime.     aspirin EC 325 MG tablet  Take 1 tablet (325 mg total) by mouth every 12 (twelve) hours.     docusate sodium 100 MG capsule  Commonly known as:  COLACE  Take 1 capsule (100 mg total) by mouth 2 (two) times daily.     gabapentin 300 MG capsule  Commonly known as:  NEURONTIN  Take 2 capsules (600 mg total) by mouth 4 (four) times daily.     HYDROmorphone 2 MG tablet  Commonly known as:  DILAUDID  Take 0.5-1 tablets (1-2 mg total) by mouth every 8 (eight) hours as needed for severe pain.     methocarbamol 500 MG tablet  Commonly known as:  ROBAXIN  Take 1-2 tablets (500-1,000 mg total) by mouth every 6 (six) hours as needed for muscle spasms.     montelukast 10 MG tablet  Commonly known as:  SINGULAIR  Take 10 mg by mouth at bedtime.     oxyCODONE 5 MG immediate release tablet  Commonly known as:  Oxy IR/ROXICODONE  Take 1-2 tablets (5-10 mg total) by mouth every 6 (six) hours as needed for severe pain or breakthrough pain (take between percocet).     oxyCODONE-acetaminophen 10-325  MG per tablet  Commonly known as:  PERCOCET  Take 1-2 tablets by mouth every 4 (four) hours as needed for pain.       Follow-up Information    Follow up with HANDY,MICHAEL H, MD In 2 weeks.   Specialty:  Orthopedic Surgery   Why:  For wound re-check, For suture removal   Contact information:   [address] [place] [address] [phone]       Discharge Instructions and Plan:  44 y/o female s/p IMN R tibia nonunion w/ removal of broken HW   1. R tibia nonunion s/p IMN             TDWB R leg             Unrestricted knee and ankle ROM               Ice and elevate              PT/OT evals             Dressing changes prn  2. Pain management:             Complex issue                dc pain med regimen                            Percocet 10/325 1-2 po q4-6h prn pain                         Oxy ir 5-10 mg po q3h prn severe breakthrough pain                         Robaxin [phone] mg po q6h prn spasms                          Gabapentin 600 mg po q6h                           Amitriptyline 50 mg po at bedtime                           Dilaudid 1mg  1-2 po q8h prn severe pain, wean off of this as soon as possible  3. ABL anemia/Hemodynamics             stable  4. DVT/PE prophylaxis:                          ASA 325 mg po BID at dc    5. Metabolic Bone Disease:             Vitamin D levels look good             Bone quality not optimal clinically               outpt dexa             We tried forteo after last surgery but pt did not tolerate at all                            Checking rheumatologic labs to see if this may be a contributing component  to pts intractable pain                           RF normal                           ANA pending   6. Activity:             OOB as tolerated             TDWD R leg  7. FEN/Foley/Lines:             Diet as tolerated   8. Dispo:             dc home today             folow up in 10-14 days with ortho      Signed:  Jari Pigg, PA-C [facility] [phone] (P) 12/10/2014, 12:19 PM

## 2014-12-10 NOTE — Progress Notes (Signed)
Physical Therapy Treatment Patient Details Name: April [name] MRN: [id number] DOB: 1970-09-03 Today's Date: 12/10/2014    History of Present Illness 44 year old white female well known to the orthopedic trauma service for right distal tibia fracture which developed a nonunion. She has undergone numerous procedures to her right tibia most recent one included plate osteosynthesis with ICBG. Patient had been doing well following the surgery however she had been having some issues regarding her pain. She is referred to outpatient anesthesiology where she was getting selective nerve hardware doing quite well. About a week ago she felt a pop in her right leg. She presented to the office for workup which demonstrated a broken hardware and persistent nonunion. She presents today for hardware removal and IM nailing of her right tibia.    PT Comments    Pt somewhat agitated at beginning of session 2/2 personal problems. However, was motivated to do therapy so she can go home. Modified independent with bed mobility, transfers and ambulation. Pt hopes to be D/C this afternoon. States daughter will be at home to assist her with ADL's. Continue to recommend outpatient rehab for ongoing therapy to increase functional independence.  Follow Up Recommendations  Supervision for mobility/OOB;Outpatient PT     Equipment Recommendations  Rolling walker with 5" wheels;Wheelchair (measurements PT);Wheelchair cushion (measurements PT)    Recommendations for Other Services       Precautions / Restrictions Precautions Precautions: Fall Precaution Comments: Pt impulsive  Restrictions RLE Weight Bearing: Touchdown weight bearing    Mobility  Bed Mobility Overal bed mobility: Modified Independent             General bed mobility comments: Increase time sit>supine.  Transfers Overall transfer level: Needs assistance Equipment used: Rolling walker (2 wheeled) Transfers: Sit to/from Stand Sit to Stand:  Modified independent (Device/Increase time)         General transfer comment: Pt still somewhat impulsive but was better at being aware of safety.  Ambulation/Gait Ambulation/Gait assistance: Modified independent (Device/Increase time) Ambulation Distance (Feet): 100 Feet Assistive device: Rolling walker (2 wheeled) Gait Pattern/deviations: Step-to pattern;Antalgic   Gait velocity interpretation: Below normal speed for age/gender General Gait Details: Pt needed cues to not let walker get to far out in front of her.   Stairs            Wheelchair Mobility    Modified Rankin (Stroke Patients Only)       Balance                                    Cognition Arousal/Alertness: Awake/alert Behavior During Therapy: WFL for tasks assessed/performed Overall Cognitive Status: Within Functional Limits for tasks assessed                      Exercises      General Comments        Pertinent Vitals/Pain Pain Assessment: 0-10 Pain Score: 10-Worst pain ever Pain Location: R ankle Pain Descriptors / Indicators: Aching;Sore;Throbbing Pain Intervention(s): Limited activity within patient's tolerance;Monitored during session;Repositioned    Home Living                      Prior Function            PT Goals (current goals can now be found in the care plan section) Progress towards PT goals: Progressing toward goals    Frequency  Min  5X/week    PT Plan Current plan remains appropriate    Co-evaluation             End of Session   Activity Tolerance: Patient limited by pain Patient left: in bed;with call bell/phone within reach     Time: 0921-0930 PT Time Calculation (min) (ACUTE ONLY): 9 min  Charges:                       G CodesAllyn Kenner, SPTA 2015-01-07, 9:41 AM

## 2014-12-12 LAB — GABAPENTIN LEVEL: Gabapentin Lvl: 12.1 ug/mL

## 2017-03-30 ENCOUNTER — Encounter (HOSPITAL_COMMUNITY): Payer: Self-pay

## 2017-03-30 ENCOUNTER — Encounter (HOSPITAL_COMMUNITY)
Admission: RE | Admit: 2017-03-30 | Discharge: 2017-03-30 | Disposition: A | Discharge status: Home / Self Care | Payer: Medicaid Other | Source: Ambulatory Visit | Attending: Orthopedic Surgery | Admitting: Orthopedic Surgery

## 2017-03-30 DIAGNOSIS — Z01812 Encounter for preprocedural laboratory examination: Secondary | ICD-10-CM | POA: Insufficient documentation

## 2017-03-30 HISTORY — DX: Causalgia of unspecified upper limb: G56.40

## 2017-03-30 HISTORY — DX: Fibromyalgia: M79.7

## 2017-03-30 LAB — BASIC METABOLIC PANEL
ANION GAP: 8 (ref 5–15)
CHLORIDE: 107 mmol/L (ref 101–111)
CO2: 23 mmol/L (ref 22–32)
Calcium: 8.9 mg/dL (ref 8.9–10.3)
Creatinine, Ser: 0.49 mg/dL (ref 0.44–1.00)
GFR calc Af Amer: >60 mL/min (ref >60)
GFR calc non Af Amer: >60 mL/min (ref >60)
GLUCOSE: 100 mg/dL — AB (ref 65–99)
Potassium: 4 mmol/L (ref 3.5–5.1)
Sodium: 138 mmol/L (ref 135–145)

## 2017-03-30 LAB — CBC
HCT: 41.3 % (ref 36.0–46.0)
HEMOGLOBIN: 14.6 g/dL (ref 12.0–15.0)
MCH: 30.5 pg (ref 26.0–34.0)
MCHC: 35.4 g/dL (ref 30.0–36.0)
MCV: 86.2 fL (ref 78.0–100.0)
Platelets: 280 10*3/uL (ref 150–400)
RBC: 4.79 MIL/uL (ref 3.87–5.11)
RDW: 13.3 % (ref 11.5–15.5)
WBC: 6.4 10*3/uL (ref 4.0–10.5)

## 2017-03-30 NOTE — H&P (Signed)
Orthopaedic Trauma Service (OTS) Consult   Patient ID: April [name] MRN: [id number] DOB/AGE: Oct 21, 1970 46 y.o.    HPI: April [name] is an 46 y.o.white female well known to OTS for R tibia fracture. Pt treated with IMN. Pt has healed her fracture. Presents today for removal of locking screws from nail due to them being painful.  Clinical picture complicated by CRPS but pt has learned to manage symptoms   Past Medical History:  Diagnosis Date  . ADHD (attention deficit hyperactivity disorder)   . Anemia    years ago  . Anxiety   . Arthritis   . Asthma   . Cancer [facility])    uterine cancer, hysterectomy - ?2005  . Chronic pain 12/10/2014  . Complication of anesthesia    woke up with an anxiety attack with last surgery (2013), pt. reports that she has high tolerance to medicine, also has woken during anesth.   Marland Kitchen COPD (chronic obstructive pulmonary disease) ([place])   . CRPS (complex regional pain syndrome type II)   . Depression   . Family history of adverse reaction to anesthesia    " mother had difficulty waking "  "had alot of issues, including infection".  . Fibromyalgia   . GERD (gastroesophageal reflux disease)    years ago  . Headache    migraines   . History of hiatal hernia    years ago  . Irritable bowel syndrome   . Neuromuscular disorder ([place])    nerve damage in right leg  . Neuropathic pain 12/10/2014  . Pneumonia 2012  . Shortness of breath dyspnea     Past Surgical History:  Procedure Laterality Date  . BUNIONECTOMY Bilateral   . COLONOSCOPY    . DILATION AND CURETTAGE OF UTERUS    . ESOPHAGOGASTRODUODENOSCOPY    . FRACTURE SURGERY     right tibia  . TONSILLECTOMY    . TUBAL LIGATION    . VAGINAL HYSTERECTOMY  2000  . WISDOM TOOTH EXTRACTION      Family History  Problem Relation Age of Onset  . Lung cancer Mother   . Heart disease Father   . Alcoholism Father     Social History:  reports that she quit smoking about 3 years ago. Her smoking use  included cigarettes. she has never used smokeless tobacco. She reports that she does not drink alcohol or use drugs.  Allergies:  Allergies  Allergen Reactions  . Asa [Aspirin] Other (See Comments)    Stomach irritation/upset.    Medications:   Current Meds  Medication Sig  . albuterol (PROVENTIL HFA;VENTOLIN HFA) 108 (90 BASE) MCG/ACT inhaler Inhale 2 puffs into the lungs every 6 (six) hours as needed for wheezing or shortness of breath.  . Ascorbic Acid (VITAMIN C PO) Take 1 tablet daily by mouth.  . Aspirin-Salicylamide-Caffeine (BC FAST PAIN RELIEF) 650-195-33.3 MG PACK Take 1 packet every 8 (eight) hours as needed by mouth (for pain.).  Marland Kitchen Cholecalciferol (VITAMIN D PO) Take 1 tablet daily by mouth.  . montelukast (SINGULAIR) 10 MG tablet Take 10 mg by mouth at bedtime.     Results for orders placed or performed during the hospital encounter of 03/30/17 (from the past 48 hour(s))  CBC     Status: None   Collection Time: 03/30/17  2:55 PM  Result Value Ref Range   WBC 6.4 4.0 - 10.5 K/uL   RBC 4.79 3.87 - 5.11 MIL/uL   Hemoglobin 14.6 12.0 - 15.0 g/dL   HCT 41.3  36.0 - 46.0 %   MCV 86.2 78.0 - 100.0 fL   MCH 30.5 26.0 - 34.0 pg   MCHC 35.4 30.0 - 36.0 g/dL   RDW 13.3 11.5 - 15.5 %   Platelets 280 150 - 400 K/uL  Basic metabolic panel     Status: Abnormal   Collection Time: 03/30/17  2:55 PM  Result Value Ref Range   Sodium 138 135 - 145 mmol/L   Potassium 4.0 3.5 - 5.1 mmol/L   Chloride 107 101 - 111 mmol/L   CO2 23 22 - 32 mmol/L   Glucose, Bld 100 (H) 65 - 99 mg/dL   BUN <5 (L) 6 - 20 mg/dL   Creatinine, Ser 0.49 0.44 - 1.00 mg/dL   Calcium 8.9 8.9 - 10.3 mg/dL   GFR calc non Af Amer >60 >60 mL/min   GFR calc Af Amer >60 >60 mL/min    Comment: (NOTE) The eGFR has been calculated using the CKD EPI equation. This calculation has not been validated in all clinical situations. eGFR's persistently <60 mL/min signify possible Chronic Kidney Disease.    Anion gap 8  5 - 15    No results found.  Review of Systems  Constitutional: Negative for chills and fever.  Respiratory: Negative for shortness of breath.   Cardiovascular: Negative for chest pain and palpitations.  Gastrointestinal: Negative for nausea and vomiting.  Genitourinary: Negative for dysuria.   Vitals on arrival to short stay  Physical Exam  Constitutional: She is cooperative.  Pleasant, NAD  Cardiovascular: Normal rate, regular rhythm, S1 normal and S2 normal.  Pulmonary/Chest: Effort normal. No respiratory distress.  Abdominal: Soft. Bowel sounds are normal.  Musculoskeletal:  Right Lower Extremity    Lower leg surgical wounds well healed   No swelling    Distal motor and sensory functions grossly intact    Ext warm    + tenderness over all locking bolts in R tibia     No DCT     Compartments are soft   Neurological: She is alert.     Assessment/Plan:  46 y/o female with symptomatic HW R tibia   -symptomatic HW R tibia   OR for removal of locking screws R tibia   No restrictions post op  WBAT post op    ROM as tolerated post op   Outpatient procedure  Risks and benefits reviewed with pt, she wishes to proceed with surgery  - Dispo:  OR for [facility] R tibia    Jari Pigg, PA-C [facility] [phone] (C) [phone] (O) 03/30/2017, 9:30 PM

## 2017-03-30 NOTE — Progress Notes (Signed)
PCP - Dr. Holley Raring Cardiologist - patient denies  Chest x-ray - n/a EKG - n/a Stress Test - patient states it was 18+ years ago because she was having chest pain but she was diagnosed with anxiety. ECHO - patient denies Cardiac Cath - patient denies  Sleep Study - patient denies    Patient denies shortness of breath, fever, cough and chest pain at PAT appointment   Patient verbalized understanding of instructions that were given to them at the PAT appointment. Patient was also instructed that they will need to review over the PAT instructions again at home before surgery.

## 2017-03-30 NOTE — Pre-Procedure Instructions (Signed)
Lorilynn Lehr  03/30/2017      [id number] - [place], [place] - [address] [place] [address] Phone: [phone] Fax: [phone]    Your procedure is scheduled on 03/31/2017.  Report to [place] [facility]mitting at 0800 A.M.  Call this number if you have problems the morning of surgery:  [phone]   Remember:  Do not eat food or drink liquids after midnight.  Take these medicines the morning of surgery with A SIP OF WATER: Albuterol inhaler - if needed  7 days prior to surgery STOP taking any Aspirin (unless otherwise instructed by your surgeon), Aleve, Naproxen, Ibuprofen, Motrin, Advil, Goody's, BC's, all herbal medications, fish oil, and all vitamins    Do not wear jewelry, make-up or nail polish.  Do not wear lotions, powders, or perfumes, or deodorant.  Do not shave 48 hours prior to surgery.    Do not bring valuables to the hospital.  [facility] is not responsible for any belongings or valuables.  Contacts, eyeglasses, dentures or bridgework may not be worn into surgery.  Leave your suitcase in the car.  After surgery it may be brought to your room.  For patients admitted to the hospital, discharge time will be determined by your treatment team.  Patients discharged the day of surgery will not be allowed to drive home.   Name and phone number of your driver:    Special instructions:   [facility]- Preparing For Surgery  Before surgery, you can play an important role. Because skin is not sterile, your skin needs to be as free of germs as possible. You can reduce the number of germs on your skin by washing with CHG (chlorahexidine gluconate) Soap before surgery.  CHG is an antiseptic cleaner which kills germs and bonds with the skin to continue killing germs even after washing.  Please do not use if you have an allergy to CHG or antibacterial soaps. If your skin becomes reddened/irritated stop using the CHG.  Do not shave  (including legs and underarms) for at least 48 hours prior to first CHG shower. It is OK to shave your face.  Please follow these instructions carefully.   1. Shower the NIGHT BEFORE SURGERY and the MORNING OF SURGERY with CHG.   2. If you chose to wash your hair, wash your hair first as usual with your normal shampoo.  3. After you shampoo, rinse your hair and body thoroughly to remove the shampoo.  4. Use CHG as you would any other liquid soap. You can apply CHG directly to the skin and wash gently with a scrungie or a clean washcloth.   5. Apply the CHG Soap to your body ONLY FROM THE NECK DOWN.  Do not use on open wounds or open sores. Avoid contact with your eyes, ears, mouth and genitals (private parts). Wash Face and genitals (private parts)  with your normal soap.  6. Wash thoroughly, paying special attention to the area where your surgery will be performed.  7. Thoroughly rinse your body with warm water from the neck down.  8. DO NOT shower/wash with your normal soap after using and rinsing off the CHG Soap.  9. Pat yourself dry with a CLEAN TOWEL.  10. Wear CLEAN PAJAMAS to bed the night before surgery, wear comfortable clothes the morning of surgery  11. Place CLEAN SHEETS on your bed the night of your first shower and DO NOT SLEEP WITH PETS.  Day of Surgery: Shower as stated above. Do not apply any deodorants/lotions.  Please wear clean clothes to the hospital/surgery center.      Please read over the following fact sheets that you were given. Coughing and Deep Breathing and Surgical Site Infection Prevention

## 2017-03-31 ENCOUNTER — Ambulatory Visit (HOSPITAL_COMMUNITY)
Admission: RE | Admit: 2017-03-31 | Discharge: 2017-03-31 | Disposition: A | Discharge status: Home / Self Care | Payer: Medicaid Other | Source: Ambulatory Visit | Attending: Orthopedic Surgery | Admitting: Orthopedic Surgery

## 2017-03-31 ENCOUNTER — Encounter (HOSPITAL_COMMUNITY)
Admission: RE | Disposition: A | Discharge status: Home / Self Care | Payer: Self-pay | Source: Ambulatory Visit | Attending: Orthopedic Surgery

## 2017-03-31 ENCOUNTER — Ambulatory Visit (HOSPITAL_COMMUNITY): Payer: Medicaid Other

## 2017-03-31 ENCOUNTER — Ambulatory Visit (HOSPITAL_COMMUNITY): Payer: Medicaid Other | Admitting: Certified Registered"

## 2017-03-31 ENCOUNTER — Encounter (HOSPITAL_COMMUNITY): Payer: Self-pay

## 2017-03-31 DIAGNOSIS — Z79899 Other long term (current) drug therapy: Secondary | ICD-10-CM | POA: Insufficient documentation

## 2017-03-31 DIAGNOSIS — K589 Irritable bowel syndrome without diarrhea: Secondary | ICD-10-CM | POA: Insufficient documentation

## 2017-03-31 DIAGNOSIS — G5771 Causalgia of right lower limb: Secondary | ICD-10-CM | POA: Insufficient documentation

## 2017-03-31 DIAGNOSIS — G43909 Migraine, unspecified, not intractable, without status migrainosus: Secondary | ICD-10-CM | POA: Insufficient documentation

## 2017-03-31 DIAGNOSIS — K219 Gastro-esophageal reflux disease without esophagitis: Secondary | ICD-10-CM | POA: Insufficient documentation

## 2017-03-31 DIAGNOSIS — R0600 Dyspnea, unspecified: Secondary | ICD-10-CM | POA: Insufficient documentation

## 2017-03-31 DIAGNOSIS — F909 Attention-deficit hyperactivity disorder, unspecified type: Secondary | ICD-10-CM | POA: Insufficient documentation

## 2017-03-31 DIAGNOSIS — Z7982 Long term (current) use of aspirin: Secondary | ICD-10-CM | POA: Diagnosis not present

## 2017-03-31 DIAGNOSIS — Z90710 Acquired absence of both cervix and uterus: Secondary | ICD-10-CM | POA: Insufficient documentation

## 2017-03-31 DIAGNOSIS — Z8542 Personal history of malignant neoplasm of other parts of uterus: Secondary | ICD-10-CM | POA: Diagnosis not present

## 2017-03-31 DIAGNOSIS — F419 Anxiety disorder, unspecified: Secondary | ICD-10-CM | POA: Insufficient documentation

## 2017-03-31 DIAGNOSIS — Z419 Encounter for procedure for purposes other than remedying health state, unspecified: Secondary | ICD-10-CM

## 2017-03-31 DIAGNOSIS — M1990 Unspecified osteoarthritis, unspecified site: Secondary | ICD-10-CM | POA: Diagnosis not present

## 2017-03-31 DIAGNOSIS — G709 Myoneural disorder, unspecified: Secondary | ICD-10-CM | POA: Insufficient documentation

## 2017-03-31 DIAGNOSIS — Z87891 Personal history of nicotine dependence: Secondary | ICD-10-CM | POA: Diagnosis not present

## 2017-03-31 DIAGNOSIS — Z886 Allergy status to analgesic agent status: Secondary | ICD-10-CM | POA: Diagnosis not present

## 2017-03-31 DIAGNOSIS — K449 Diaphragmatic hernia without obstruction or gangrene: Secondary | ICD-10-CM | POA: Diagnosis not present

## 2017-03-31 DIAGNOSIS — M797 Fibromyalgia: Secondary | ICD-10-CM | POA: Diagnosis not present

## 2017-03-31 DIAGNOSIS — F329 Major depressive disorder, single episode, unspecified: Secondary | ICD-10-CM | POA: Diagnosis not present

## 2017-03-31 DIAGNOSIS — J449 Chronic obstructive pulmonary disease, unspecified: Secondary | ICD-10-CM | POA: Insufficient documentation

## 2017-03-31 DIAGNOSIS — Z472 Encounter for removal of internal fixation device: Secondary | ICD-10-CM | POA: Insufficient documentation

## 2017-03-31 DIAGNOSIS — T8484XA Pain due to internal orthopedic prosthetic devices, implants and grafts, initial encounter: Secondary | ICD-10-CM

## 2017-03-31 HISTORY — PX: HARDWARE REMOVAL: SHX979

## 2017-03-31 SURGERY — REMOVAL, HARDWARE
Anesthesia: General | Laterality: Right

## 2017-03-31 MED ORDER — PROPOFOL 10 MG/ML IV BOLUS
INTRAVENOUS | Status: AC
  Filled 2017-03-31: qty 20

## 2017-03-31 MED ORDER — PHENYLEPHRINE 40 MCG/ML (10ML) SYRINGE FOR IV PUSH (FOR BLOOD PRESSURE SUPPORT)
PREFILLED_SYRINGE | INTRAVENOUS | Status: AC
  Filled 2017-03-31: qty 10

## 2017-03-31 MED ORDER — ONDANSETRON HCL 4 MG/2ML IJ SOLN
INTRAMUSCULAR | Status: AC
  Filled 2017-03-31: qty 2

## 2017-03-31 MED ORDER — OXYCODONE HCL 5 MG/5ML PO SOLN: 5.0000 mg | Freq: Once | ORAL | Status: DC | PRN

## 2017-03-31 MED ORDER — PROPOFOL 500 MG/50ML IV EMUL
INTRAVENOUS | Status: DC | PRN
  Administered 2017-03-31: 50 ug/kg/min via INTRAVENOUS

## 2017-03-31 MED ORDER — ONDANSETRON HCL 4 MG/2ML IJ SOLN
4.0000 mg | Freq: Once | INTRAMUSCULAR | Status: AC | PRN
  Administered 2017-03-31: 4 mg via INTRAVENOUS

## 2017-03-31 MED ORDER — ONDANSETRON HCL 4 MG/2ML IJ SOLN
INTRAMUSCULAR | Status: DC | PRN
Start: 2017-03-31 — End: 2017-03-31
  Administered 2017-03-31: 4 mg via INTRAVENOUS

## 2017-03-31 MED ORDER — BUPIVACAINE LIPOSOME 1.3 % IJ SUSP
INTRAMUSCULAR | Status: DC | PRN
  Administered 2017-03-31: 25 mL

## 2017-03-31 MED ORDER — ROCURONIUM BROMIDE 10 MG/ML (PF) SYRINGE
PREFILLED_SYRINGE | INTRAVENOUS | Status: AC
  Filled 2017-03-31: qty 5

## 2017-03-31 MED ORDER — LIDOCAINE 2% (20 MG/ML) 5 ML SYRINGE
INTRAMUSCULAR | Status: DC | PRN
  Administered 2017-03-31: 40 mg via INTRAVENOUS

## 2017-03-31 MED ORDER — FENTANYL CITRATE (PF) 100 MCG/2ML IJ SOLN
25.0000 ug | INTRAMUSCULAR | Status: DC | PRN
  Administered 2017-03-31 (×2): 50 ug via INTRAVENOUS

## 2017-03-31 MED ORDER — KETOROLAC TROMETHAMINE 30 MG/ML IJ SOLN
INTRAMUSCULAR | Status: AC
  Filled 2017-03-31: qty 1

## 2017-03-31 MED ORDER — DEXAMETHASONE SODIUM PHOSPHATE 10 MG/ML IJ SOLN
INTRAMUSCULAR | Status: AC
  Filled 2017-03-31: qty 1

## 2017-03-31 MED ORDER — FENTANYL CITRATE (PF) 100 MCG/2ML IJ SOLN
INTRAMUSCULAR | Status: AC
  Administered 2017-03-31: 50 ug via INTRAVENOUS
  Filled 2017-03-31: qty 2

## 2017-03-31 MED ORDER — MIDAZOLAM HCL 2 MG/2ML IJ SOLN
INTRAMUSCULAR | Status: AC
  Filled 2017-03-31: qty 2

## 2017-03-31 MED ORDER — FENTANYL CITRATE (PF) 250 MCG/5ML IJ SOLN
INTRAMUSCULAR | Status: DC | PRN
  Administered 2017-03-31 (×5): 50 ug via INTRAVENOUS

## 2017-03-31 MED ORDER — LACTATED RINGERS IV SOLN
INTRAVENOUS | Status: DC
  Administered 2017-03-31 (×2): via INTRAVENOUS

## 2017-03-31 MED ORDER — KETOROLAC TROMETHAMINE 10 MG PO TABS: 10.0000 mg | ORAL_TABLET | Freq: Four times a day (QID) | ORAL | 0 refills | Status: DC | PRN

## 2017-03-31 MED ORDER — DEXAMETHASONE SODIUM PHOSPHATE 10 MG/ML IJ SOLN
INTRAMUSCULAR | Status: DC | PRN
  Administered 2017-03-31: 10 mg via INTRAVENOUS

## 2017-03-31 MED ORDER — PHENYLEPHRINE 40 MCG/ML (10ML) SYRINGE FOR IV PUSH (FOR BLOOD PRESSURE SUPPORT)
PREFILLED_SYRINGE | INTRAVENOUS | Status: DC | PRN
  Administered 2017-03-31: 80 ug via INTRAVENOUS
  Administered 2017-03-31: 40 ug via INTRAVENOUS
  Administered 2017-03-31: 80 ug via INTRAVENOUS

## 2017-03-31 MED ORDER — CHLORHEXIDINE GLUCONATE 4 % EX LIQD: 60.0000 mL | Freq: Once | CUTANEOUS | Status: DC

## 2017-03-31 MED ORDER — BUPIVACAINE LIPOSOME 1.3 % IJ SUSP
20.0000 mL | INTRAMUSCULAR | Status: DC
  Filled 2017-03-31: qty 20

## 2017-03-31 MED ORDER — FENTANYL CITRATE (PF) 100 MCG/2ML IJ SOLN
INTRAMUSCULAR | Status: AC
  Filled 2017-03-31: qty 2

## 2017-03-31 MED ORDER — ONDANSETRON 4 MG PO TBDP: 4.0000 mg | ORAL_TABLET | Freq: Three times a day (TID) | ORAL | 0 refills | Status: DC | PRN

## 2017-03-31 MED ORDER — MIDAZOLAM HCL 5 MG/5ML IJ SOLN
INTRAMUSCULAR | Status: DC | PRN
  Administered 2017-03-31: 2 mg via INTRAVENOUS

## 2017-03-31 MED ORDER — KETOROLAC TROMETHAMINE 30 MG/ML IJ SOLN
INTRAMUSCULAR | Status: DC | PRN
Start: 2017-03-31 — End: 2017-03-31
  Administered 2017-03-31: 30 mg via INTRAVENOUS

## 2017-03-31 MED ORDER — PROPOFOL 10 MG/ML IV BOLUS
INTRAVENOUS | Status: DC | PRN
  Administered 2017-03-31: 200 mg via INTRAVENOUS

## 2017-03-31 MED ORDER — FENTANYL CITRATE (PF) 250 MCG/5ML IJ SOLN
INTRAMUSCULAR | Status: AC
  Filled 2017-03-31: qty 5

## 2017-03-31 MED ORDER — 0.9 % SODIUM CHLORIDE (POUR BTL) OPTIME
TOPICAL | Status: DC | PRN
  Administered 2017-03-31: 1000 mL

## 2017-03-31 MED ORDER — OXYCODONE HCL 5 MG PO TABS: 5.0000 mg | ORAL_TABLET | Freq: Once | ORAL | Status: DC | PRN

## 2017-03-31 MED ORDER — LIDOCAINE 2% (20 MG/ML) 5 ML SYRINGE
INTRAMUSCULAR | Status: AC
  Filled 2017-03-31: qty 5

## 2017-03-31 MED ORDER — CEFAZOLIN SODIUM-DEXTROSE 2-4 GM/100ML-% IV SOLN
2.0000 g | INTRAVENOUS | Status: AC
  Administered 2017-03-31: 2 g via INTRAVENOUS
  Filled 2017-03-31: qty 100

## 2017-03-31 MED ORDER — SODIUM CHLORIDE 0.9 % IJ SOLN
INTRAMUSCULAR | Status: DC | PRN
  Administered 2017-03-31: 40 mL

## 2017-03-31 SURGICAL SUPPLY — 57 items
BANDAGE ACE 4X5 VEL STRL LF (GAUZE/BANDAGES/DRESSINGS) ×3 IMPLANT
BANDAGE ACE 6X5 VEL STRL LF (GAUZE/BANDAGES/DRESSINGS) ×3 IMPLANT
BANDAGE ESMARK 6X9 LF (GAUZE/BANDAGES/DRESSINGS) ×1 IMPLANT
BNDG COHESIVE 6X5 TAN STRL LF (GAUZE/BANDAGES/DRESSINGS) ×3 IMPLANT
BNDG ESMARK 6X9 LF (GAUZE/BANDAGES/DRESSINGS) ×3
BNDG GAUZE ELAST 4 BULKY (GAUZE/BANDAGES/DRESSINGS) ×6 IMPLANT
BRUSH SCRUB SURG 4.25 DISP (MISCELLANEOUS) ×6 IMPLANT
CLOSURE WOUND 1/2 X4 (GAUZE/BANDAGES/DRESSINGS)
COVER SURGICAL LIGHT HANDLE (MISCELLANEOUS) ×6 IMPLANT
CUFF TOURNIQUET SINGLE 18IN (TOURNIQUET CUFF) IMPLANT
CUFF TOURNIQUET SINGLE 24IN (TOURNIQUET CUFF) ×3 IMPLANT
CUFF TOURNIQUET SINGLE 34IN LL (TOURNIQUET CUFF) IMPLANT
DRAPE C-ARM 42X72 X-RAY (DRAPES) IMPLANT
DRAPE C-ARMOR (DRAPES) ×3 IMPLANT
DRAPE U-SHAPE 47X51 STRL (DRAPES) ×3 IMPLANT
DRSG ADAPTIC 3X8 NADH LF (GAUZE/BANDAGES/DRESSINGS) ×3 IMPLANT
ELECT REM PT RETURN 9FT ADLT (ELECTROSURGICAL) ×3
ELECTRODE REM PT RTRN 9FT ADLT (ELECTROSURGICAL) ×1 IMPLANT
GAUZE SPONGE 4X4 12PLY STRL (GAUZE/BANDAGES/DRESSINGS) ×3 IMPLANT
GLOVE BIO SURGEON STRL SZ7.5 (GLOVE) ×3 IMPLANT
GLOVE BIO SURGEON STRL SZ8 (GLOVE) ×3 IMPLANT
GLOVE BIOGEL PI IND STRL 7.5 (GLOVE) ×1 IMPLANT
GLOVE BIOGEL PI IND STRL 8 (GLOVE) ×1 IMPLANT
GLOVE BIOGEL PI INDICATOR 7.5 (GLOVE) ×2
GLOVE BIOGEL PI INDICATOR 8 (GLOVE) ×2
GOWN STRL REUS W/ TWL LRG LVL3 (GOWN DISPOSABLE) ×2 IMPLANT
GOWN STRL REUS W/ TWL XL LVL3 (GOWN DISPOSABLE) ×1 IMPLANT
GOWN STRL REUS W/TWL LRG LVL3 (GOWN DISPOSABLE) ×4
GOWN STRL REUS W/TWL XL LVL3 (GOWN DISPOSABLE) ×2
KIT BASIN OR (CUSTOM PROCEDURE TRAY) ×3 IMPLANT
KIT ROOM TURNOVER OR (KITS) ×3 IMPLANT
MANIFOLD NEPTUNE II (INSTRUMENTS) ×3 IMPLANT
NEEDLE 22X1 1/2 (OR ONLY) (NEEDLE) IMPLANT
NS IRRIG 1000ML POUR BTL (IV SOLUTION) ×3 IMPLANT
PACK ORTHO EXTREMITY (CUSTOM PROCEDURE TRAY) ×3 IMPLANT
PAD ARMBOARD 7.5X6 YLW CONV (MISCELLANEOUS) ×6 IMPLANT
PADDING CAST COTTON 6X4 STRL (CAST SUPPLIES) IMPLANT
SPONGE LAP 18X18 X RAY DECT (DISPOSABLE) ×6 IMPLANT
STAPLER VISISTAT 35W (STAPLE) IMPLANT
STOCKINETTE IMPERVIOUS LG (DRAPES) ×3 IMPLANT
STRIP CLOSURE SKIN 1/2X4 (GAUZE/BANDAGES/DRESSINGS) IMPLANT
SUCTION FRAZIER HANDLE 10FR (MISCELLANEOUS)
SUCTION TUBE FRAZIER 10FR DISP (MISCELLANEOUS) IMPLANT
SUT ETHILON 3 0 PS 1 (SUTURE) IMPLANT
SUT PDS AB 2-0 CT1 27 (SUTURE) ×3 IMPLANT
SUT VIC AB 0 CT1 27 (SUTURE) ×2
SUT VIC AB 0 CT1 27XBRD ANBCTR (SUTURE) ×1 IMPLANT
SUT VIC AB 2-0 CT1 27 (SUTURE) ×2
SUT VIC AB 2-0 CT1 TAPERPNT 27 (SUTURE) ×1 IMPLANT
SYR CONTROL 10ML LL (SYRINGE) IMPLANT
TOWEL OR 17X24 6PK STRL BLUE (TOWEL DISPOSABLE) ×3 IMPLANT
TOWEL OR 17X26 10 PK STRL BLUE (TOWEL DISPOSABLE) ×3 IMPLANT
TUBE CONNECTING 12'X1/4 (SUCTIONS) ×1
TUBE CONNECTING 12X1/4 (SUCTIONS) ×2 IMPLANT
UNDERPAD 30X30 (UNDERPADS AND DIAPERS) ×3 IMPLANT
WATER STERILE IRR 1000ML POUR (IV SOLUTION) ×6 IMPLANT
YANKAUER SUCT BULB TIP NO VENT (SUCTIONS) ×3 IMPLANT

## 2017-03-31 NOTE — Anesthesia Procedure Notes (Signed)
Procedure Name: LMA Insertion Date/Time: 03/31/2017 8:20 AM Performed by: Freddie Breech, CRNA Pre-anesthesia Checklist: Patient identified, Emergency Drugs available, Suction available and Patient being monitored Patient Re-evaluated:Patient Re-evaluated prior to induction Oxygen Delivery Method: Circle System Utilized Preoxygenation: Pre-oxygenation with 100% oxygen Induction Type: IV induction Ventilation: Mask ventilation without difficulty LMA: LMA inserted LMA Size: 3.0 Number of attempts: 1 Airway Equipment and Method: Bite block Placement Confirmation: positive ETCO2 Tube secured with: Tape Dental Injury: Teeth and Oropharynx as per pre-operative assessment

## 2017-03-31 NOTE — Discharge Instructions (Addendum)
Orthopaedic Trauma Service Discharge Instructions   General Discharge Instructions  WEIGHT BEARING STATUS: weightbearing as tolerated  RANGE OF MOTION/ACTIVITY: as tolerated  Wound Care: daily dressing changes starting on 04/03/2017. See below  Discharge Wound Care Instructions  Do NOT apply any ointments, solutions or lotions to pin sites or surgical wounds.  These prevent needed drainage and even though solutions like hydrogen peroxide kill bacteria, they also damage cells lining the pin sites that help fight infection.  Applying lotions or ointments can keep the wounds moist and can cause them to breakdown and open up as well. This can increase the risk for infection. When in doubt call the office.  Surgical incisions should be dressed daily.  If any drainage is noted, use one layer of adaptic, then gauze, Kerlix, and an ace wrap.  Once the incision is completely dry and without drainage, it may be left open to air out.  Showering may begin 36-48 hours later.  Cleaning gently with soap and water.  Traumatic wounds should be dressed daily as well.    One layer of adaptic, gauze, Kerlix, then ace wrap.  The adaptic can be discontinued once the draining has ceased    If you have a wet to dry dressing: wet the gauze with saline the squeeze as much saline out so the gauze is moist (not soaking wet), place moistened gauze over wound, then place a dry gauze over the moist one, followed by Kerlix wrap, then ace wrap.   Diet: as you were eating previously.  Can use over the counter stool softeners and bowel preparations, such as Miralax, to help with bowel movements.  Narcotics can be constipating.  Be sure to drink plenty of fluids  PAIN MEDICATION USE AND EXPECTATIONS  You have likely been given narcotic medications to help control your pain.  After a traumatic event that results in an fracture (broken bone) with or without surgery, it is ok to use narcotic pain medications to help control  one's pain.  We understand that everyone responds to pain differently and each individual patient will be evaluated on a regular basis for the continued need for narcotic medications. Ideally, narcotic medication use should last no more than 6-8 weeks (coinciding with fracture healing).   As a patient it is your responsibility as well to monitor narcotic medication use and report the amount and frequency you use these medications when you come to your office visit.   We would also advise that if you are using narcotic medications, you should take a dose prior to therapy to maximize you participation.  IF YOU ARE ON NARCOTIC MEDICATIONS IT IS NOT PERMISSIBLE TO OPERATE A MOTOR VEHICLE (MOTORCYCLE/CAR/TRUCK/MOPED) OR HEAVY MACHINERY DO NOT MIX NARCOTICS WITH OTHER CNS (CENTRAL NERVOUS SYSTEM) DEPRESSANTS SUCH AS ALCOHOL   STOP SMOKING OR USING NICOTINE PRODUCTS!!!!  As discussed nicotine severely impairs your body's ability to heal surgical and traumatic wounds but also impairs bone healing.  Wounds and bone heal by forming microscopic blood vessels (angiogenesis) and nicotine is a vasoconstrictor (essentially, shrinks blood vessels).  Therefore, if vasoconstriction occurs to these microscopic blood vessels they essentially disappear and are unable to deliver necessary nutrients to the healing tissue.  This is one modifiable factor that you can do to dramatically increase your chances of healing your injury.    (This means no smoking, no nicotine gum, patches, etc)  DO NOT USE NONSTEROIDAL ANTI-INFLAMMATORY DRUGS (NSAID'S)  Using products such as Advil (ibuprofen), Aleve (naproxen), Motrin (ibuprofen) for  additional pain control during fracture healing can delay and/or prevent the healing response.  If you would like to take over the counter (OTC) medication, Tylenol (acetaminophen) is ok.  However, some narcotic medications that are given for pain control contain acetaminophen as well. Therefore, you  should not exceed more than 4000 mg of tylenol in a day if you do not have liver disease.  Also note that there are may OTC medicines, such as cold medicines and allergy medicines that my contain tylenol as well.  If you have any questions about medications and/or interactions please ask your doctor/PA or your pharmacist.      ICE AND ELEVATE INJURED/OPERATIVE EXTREMITY  Using ice and elevating the injured extremity above your heart can help with swelling and pain control.  Icing in a pulsatile fashion, such as 20 minutes on and 20 minutes off, can be followed.    Do not place ice directly on skin. Make sure there is a barrier between to skin and the ice pack.    Using frozen items such as frozen peas works well as the conform nicely to the are that needs to be iced.  USE AN ACE WRAP OR TED HOSE FOR SWELLING CONTROL  In addition to icing and elevation, Ace wraps or TED hose are used to help limit and resolve swelling.  It is recommended to use Ace wraps or TED hose until you are informed to stop.    When using Ace Wraps start the wrapping distally (farthest away from the body) and wrap proximally (closer to the body)   Example: If you had surgery on your leg or thing and you do not have a splint on, start the ace wrap at the toes and work your way up to the thigh        If you had surgery on your upper extremity and do not have a splint on, start the ace wrap at your fingers and work your way up to the upper arm  IF YOU ARE IN A SPLINT OR CAST DO NOT [place]   If your splint gets wet for any reason please contact the office immediately. You may shower in your splint or cast as long as you keep it dry.  This can be done by wrapping in a cast cover or garbage back (or similar)  Do Not stick any thing down your splint or cast such as pencils, money, or hangers to try and scratch yourself with.  If you feel itchy take benadryl as prescribed on the bottle for itching  IF YOU ARE IN A  CAM BOOT (BLACK BOOT)  You may remove boot periodically. Perform daily dressing changes as noted below.  Wash the liner of the boot regularly and wear a sock when wearing the boot. It is recommended that you sleep in the boot until told otherwise  CALL THE OFFICE WITH ANY QUESTIONS OR CONCERNS: [phone]        Discharge Pin Site Instructions  Dress pins daily with Kerlix roll starting on POD 2. Wrap the Kerlix so that it tamps the skin down around the pin-skin interface to prevent/limit motion of the skin relative to the pin.  (Pin-skin motion is the primary cause of pain and infection related to external fixator pin sites).  Remove any crust or coagulum that may obstruct drainage with a saline moistened gauze or soap and water.  After POD 3, if there is no discernable drainage on the pin site dressing,  the interval for change can by increased to every other day.  You may shower with the fixator, cleaning all pin sites gently with soap and water.  If you have a surgical wound this needs to be completely dry and without drainage before showering.  The extremity can be lifted by the fixator to facilitate wound care and transfers.  Notify the office/Doctor if you experience increasing drainage, redness, or pain from a pin site, or if you notice purulent (thick, snot-like) drainage.

## 2017-03-31 NOTE — Transfer of Care (Signed)
Immediate Anesthesia Transfer of Care Note  Patient: Prudie Guthridge  Procedure(s) Performed: HARDWARE REMOVAL RIGHT TIBIA (SCREWS ONLY) (Right )  Patient Location: PACU  Anesthesia Type:General  Level of Consciousness: drowsy and patient cooperative  Airway & Oxygen Therapy: Patient Spontanous Breathing and Patient connected to face mask oxygen  Post-op Assessment: Report given to RN and Post -op Vital signs reviewed and stable  Post vital signs: Reviewed and stable  Last Vitals:  Vitals:   03/31/17 0615 03/31/17 1015  BP: (!) 147/76 (!) (P) 110/98  Pulse: 90 (P) 83  Resp: 20 (P) 18  Temp: 36.6 C (P) 36.6 C  SpO2: 96% (P) 98%    Last Pain:  Vitals:   03/31/17 1015  TempSrc:   PainSc: (P) 0-No pain      Patients Stated Pain Goal: 3 (21/22/48 2500)  Complications: No apparent anesthesia complications

## 2017-03-31 NOTE — Anesthesia Preprocedure Evaluation (Signed)

## 2017-03-31 NOTE — Op Note (Signed)
03/31/2017  10:34 AM  PATIENT:  April [name]  46 y.o. female  PRE-OPERATIVE DIAGNOSIS:  symptomatic hardware right tibia  POST-OPERATIVE DIAGNOSIS:  symptomatic hardware right tibia  PROCEDURE:  Procedure(s): HARDWARE REMOVAL RIGHT TIBIA , including bone covered and stripped distal blocking screws  SURGEON:  Surgeon(s) and Role:    Altamese Garvin, MD - Primary  PHYSICIAN ASSISTANT: PA Student  ANESTHESIA:   general  I/O:  Total I/O In: 1300 [I.V.:1300] Out: 30 [Blood:30]  SPECIMEN:  No Specimen  TOURNIQUET:  * Missing tourniquet times found for documented tourniquets in log: [id number] *  DICTATION: .Note written in EPIC  BRIEF SUMMARY OF INDICATION FOR PROCEDURE:  Patient is a pleasant 46 y.o. who underwent IM nailing fixation of a fracture with subsequent healing. Despite conservative measures, hardware related symptoms have persisted. I discussed with the patient the risks and benefits of surgical removal including infection, nerve or vessel injury, failure to alleviate symptoms, occult nonunion, re-fracture, DVT, PE, and multiple others. She did wish to proceed.  BRIEF SUMMARY OF PROCEDURE:  The patient was taken to the operating room after administration of 2 g of Ancef.  General anesthesia was induced. The right lower extremity was prepped and draped in usual sterile fashion.  No tourniquet was used during the procedure.  C-arm was brought in to confirm position of the hardware.  I made  Incisions directly over the locking bolts and introduced a clamp into the head of the screws to make sure they were clear of debris. I then identified the two blocking screws and made a formal incision anteriorly, dissecting sharply down and elevating the periosteum. The superior screw was covered with bone except for about 43mm and the more distal screw stripped in the head with removal.  This required use of the osteotome proximally and the easy out and broken hardware removal set  distally, extended our operative time to make it twice as long and also required an assistant for retraction. Final x-rays confirmed removal of all hardware and a healed fracture. The wounds were irrigated thoroughly and closed in standard fashion with vicryl and nylon after injection of 25cc of Exparel.  A sterile gently compressive dressing was applied.  The patient was taken to the PACU in stable condition.  A PA student assisted me throughout.  PROGNOSIS: Patient will be weightbearing as tolerated with aggressive active and passive motion of the knee and ankle. Bleeding would be anticipated. She wishes to avoid all narcotics at this time given previous long term use. She may change or remove her dressing in 48 hours and shower. Patient will follow up in 10-14 days for removal of sutures.    [name]. Marcelino Scot, M.D.

## 2017-03-31 NOTE — Anesthesia Postprocedure Evaluation (Signed)
Anesthesia Post Note  Patient: April [name]  Procedure(s) Performed: HARDWARE REMOVAL RIGHT TIBIA (SCREWS ONLY) (Right )     Patient location during evaluation: PACU Anesthesia Type: General Level of consciousness: awake, awake and alert and oriented Pain management: pain level controlled Vital Signs Assessment: post-procedure vital signs reviewed and stable Respiratory status: spontaneous breathing, nonlabored ventilation and respiratory function stable Cardiovascular status: blood pressure returned to baseline Anesthetic complications: no    Last Vitals:  Vitals:   03/31/17 1045 03/31/17 1100  BP: 114/66   Pulse: 76 77  Resp: 13 13  Temp: 36.5 C   SpO2: 96% 95%    Last Pain:  Vitals:   03/31/17 1045  TempSrc:   PainSc: 3                  Jeri Rawlins COKER

## 2017-04-01 ENCOUNTER — Encounter (HOSPITAL_COMMUNITY): Payer: Self-pay | Admitting: Orthopedic Surgery

## 2019-03-13 IMAGING — RF DG TIBIA/FIBULA 2V*R*
1 series · 5 of 5 positions shown · non-contrast
Comparison: Right lower leg images of 12/05/2014

CLINICAL DATA: Removal of right tibial screw

EXAM:
RIGHT TIBIA AND FIBULA - 2 VIEW

[Series 1: run · 5 of 5 slices shown]
[im 1/5]
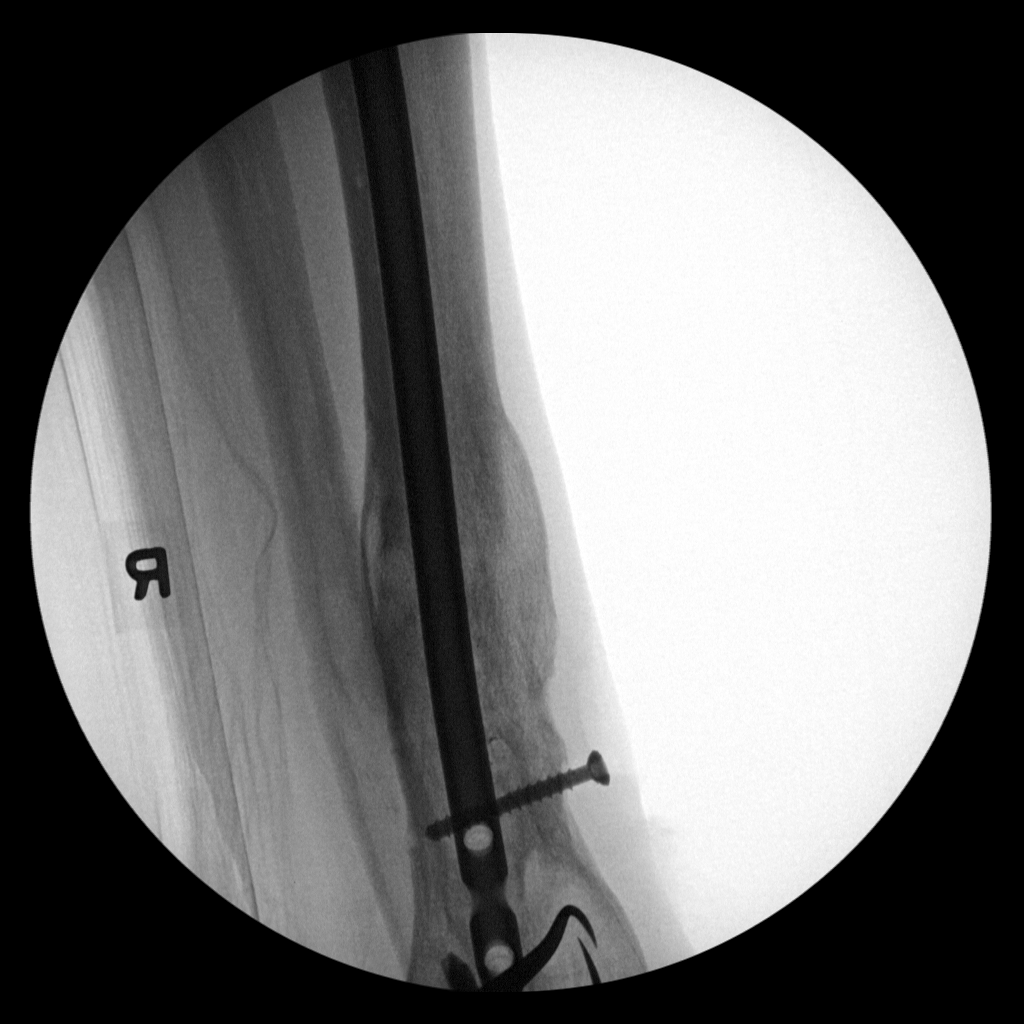
[im 2/5]
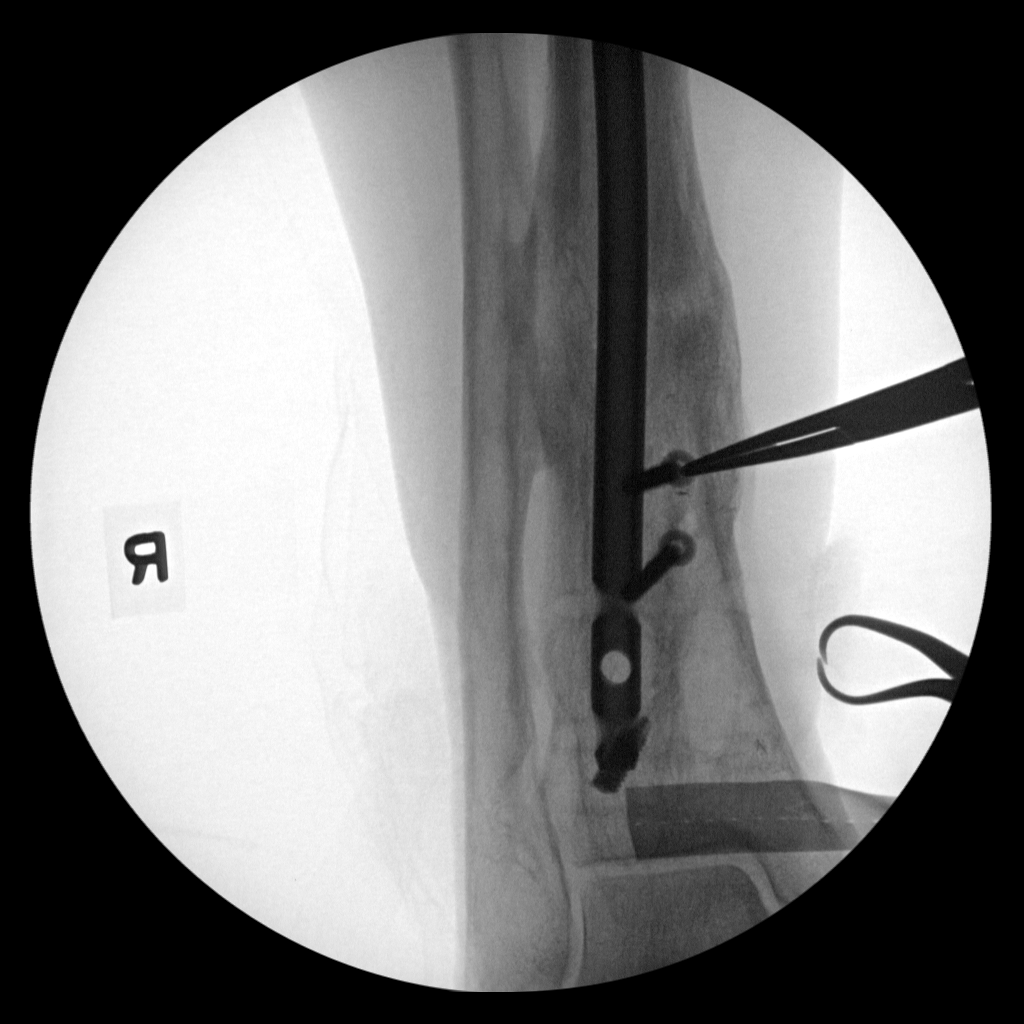
[im 3/5]
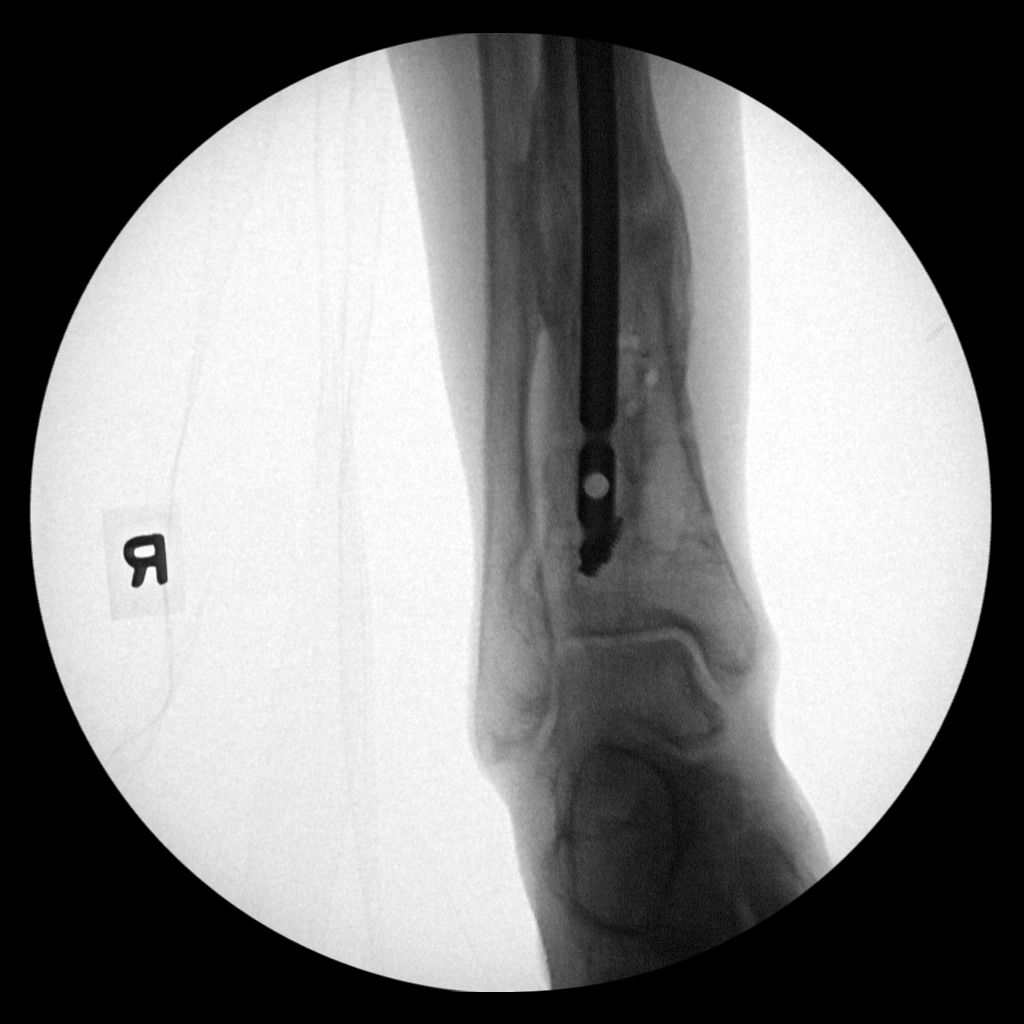
[im 4/5]
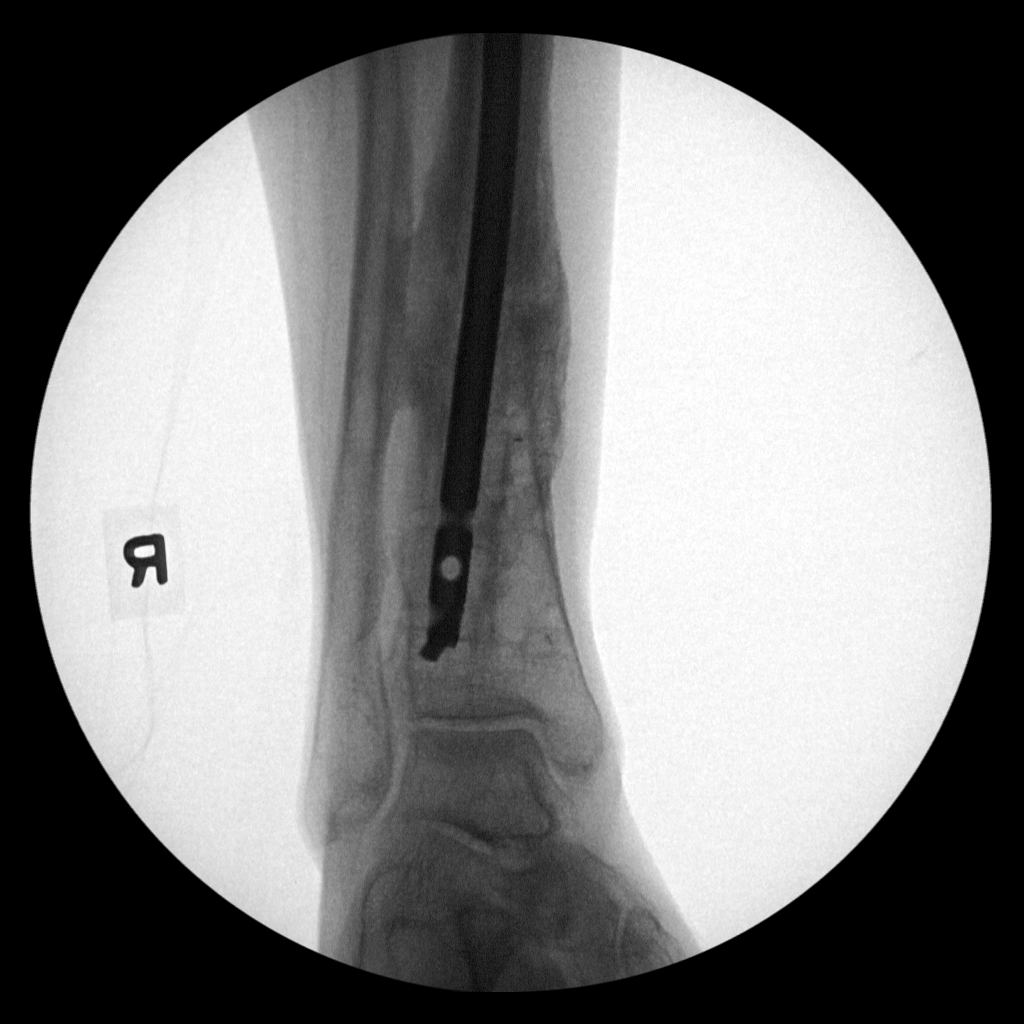
[im 5/5]
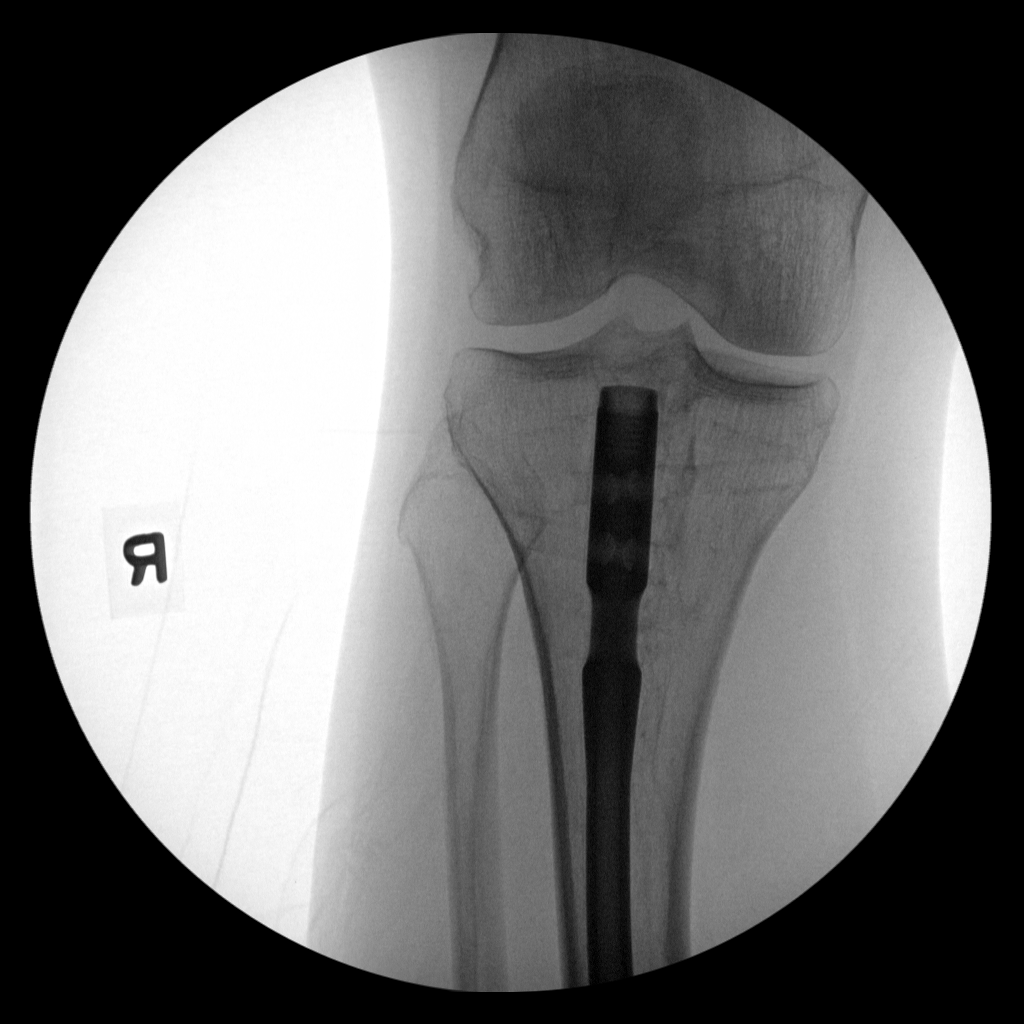

[5 of 5 positions shown; findings below may reference images not displayed]

FINDINGS: Five C-arm spot films were returned. These show removal of a lower
right fibular screw with apparent removal of the proximal tibial
screws as well.
IMPRESSION: Removal of tibial screws.

## 2021-03-09 DIAGNOSIS — E890 Postprocedural hypothyroidism: Secondary | ICD-10-CM | POA: Diagnosis present

## 2022-03-25 DIAGNOSIS — F1410 Cocaine abuse, uncomplicated: Secondary | ICD-10-CM | POA: Diagnosis present

## 2022-03-25 DIAGNOSIS — G629 Polyneuropathy, unspecified: Secondary | ICD-10-CM | POA: Insufficient documentation

## 2022-03-25 DIAGNOSIS — F319 Bipolar disorder, unspecified: Secondary | ICD-10-CM | POA: Diagnosis present

## 2022-04-17 ENCOUNTER — Other Ambulatory Visit: Payer: Self-pay

## 2022-04-17 ENCOUNTER — Emergency Department (HOSPITAL_COMMUNITY): Payer: Medicaid Other

## 2022-04-17 ENCOUNTER — Inpatient Hospital Stay (HOSPITAL_COMMUNITY)
Admission: EM | Admit: 2022-04-17 | Discharge: 2022-04-26 | DRG: 917 | Disposition: A | Discharge status: Home / Self Care | Payer: Medicaid Other | Attending: Internal Medicine | Admitting: Internal Medicine

## 2022-04-17 DIAGNOSIS — M62838 Other muscle spasm: Secondary | ICD-10-CM | POA: Diagnosis present

## 2022-04-17 DIAGNOSIS — Z9181 History of falling: Secondary | ICD-10-CM

## 2022-04-17 DIAGNOSIS — Z5989 Other problems related to housing and economic circumstances: Secondary | ICD-10-CM

## 2022-04-17 DIAGNOSIS — R4689 Other symptoms and signs involving appearance and behavior: Principal | ICD-10-CM

## 2022-04-17 DIAGNOSIS — F4310 Post-traumatic stress disorder, unspecified: Secondary | ICD-10-CM | POA: Diagnosis present

## 2022-04-17 DIAGNOSIS — K219 Gastro-esophageal reflux disease without esophagitis: Secondary | ICD-10-CM | POA: Diagnosis present

## 2022-04-17 DIAGNOSIS — Z79890 Hormone replacement therapy (postmenopausal): Secondary | ICD-10-CM

## 2022-04-17 DIAGNOSIS — F319 Bipolar disorder, unspecified: Secondary | ICD-10-CM | POA: Diagnosis present

## 2022-04-17 DIAGNOSIS — G44209 Tension-type headache, unspecified, not intractable: Secondary | ICD-10-CM | POA: Diagnosis present

## 2022-04-17 DIAGNOSIS — E876 Hypokalemia: Secondary | ICD-10-CM | POA: Diagnosis present

## 2022-04-17 DIAGNOSIS — Z6372 Alcoholism and drug addiction in family: Secondary | ICD-10-CM

## 2022-04-17 DIAGNOSIS — Z91199 Patient's noncompliance with other medical treatment and regimen due to unspecified reason: Secondary | ICD-10-CM

## 2022-04-17 DIAGNOSIS — Z811 Family history of alcohol abuse and dependence: Secondary | ICD-10-CM

## 2022-04-17 DIAGNOSIS — E871 Hypo-osmolality and hyponatremia: Secondary | ICD-10-CM | POA: Diagnosis present

## 2022-04-17 DIAGNOSIS — F419 Anxiety disorder, unspecified: Secondary | ICD-10-CM | POA: Diagnosis present

## 2022-04-17 DIAGNOSIS — G928 Other toxic encephalopathy: Secondary | ICD-10-CM | POA: Diagnosis present

## 2022-04-17 DIAGNOSIS — F1410 Cocaine abuse, uncomplicated: Secondary | ICD-10-CM | POA: Diagnosis present

## 2022-04-17 DIAGNOSIS — R32 Unspecified urinary incontinence: Secondary | ICD-10-CM | POA: Diagnosis present

## 2022-04-17 DIAGNOSIS — Z8669 Personal history of other diseases of the nervous system and sense organs: Secondary | ICD-10-CM

## 2022-04-17 DIAGNOSIS — F17210 Nicotine dependence, cigarettes, uncomplicated: Secondary | ICD-10-CM | POA: Diagnosis present

## 2022-04-17 DIAGNOSIS — G9340 Encephalopathy, unspecified: Secondary | ICD-10-CM

## 2022-04-17 DIAGNOSIS — Z79899 Other long term (current) drug therapy: Secondary | ICD-10-CM

## 2022-04-17 DIAGNOSIS — F410 Panic disorder [episodic paroxysmal anxiety] without agoraphobia: Secondary | ICD-10-CM | POA: Diagnosis present

## 2022-04-17 DIAGNOSIS — T381X6A Underdosing of thyroid hormones and substitutes, initial encounter: Secondary | ICD-10-CM | POA: Diagnosis present

## 2022-04-17 DIAGNOSIS — Z8542 Personal history of malignant neoplasm of other parts of uterus: Secondary | ICD-10-CM

## 2022-04-17 DIAGNOSIS — Z90710 Acquired absence of both cervix and uterus: Secondary | ICD-10-CM

## 2022-04-17 DIAGNOSIS — R748 Abnormal levels of other serum enzymes: Secondary | ICD-10-CM | POA: Diagnosis present

## 2022-04-17 DIAGNOSIS — Z8719 Personal history of other diseases of the digestive system: Secondary | ICD-10-CM

## 2022-04-17 DIAGNOSIS — Z638 Other specified problems related to primary support group: Secondary | ICD-10-CM

## 2022-04-17 DIAGNOSIS — G929 Unspecified toxic encephalopathy: Secondary | ICD-10-CM | POA: Diagnosis present

## 2022-04-17 DIAGNOSIS — T43595A Adverse effect of other antipsychotics and neuroleptics, initial encounter: Secondary | ICD-10-CM | POA: Diagnosis present

## 2022-04-17 DIAGNOSIS — K5900 Constipation, unspecified: Secondary | ICD-10-CM | POA: Diagnosis present

## 2022-04-17 DIAGNOSIS — N179 Acute kidney failure, unspecified: Secondary | ICD-10-CM | POA: Diagnosis present

## 2022-04-17 DIAGNOSIS — F1414 Cocaine abuse with cocaine-induced mood disorder: Secondary | ICD-10-CM | POA: Diagnosis present

## 2022-04-17 DIAGNOSIS — M797 Fibromyalgia: Secondary | ICD-10-CM | POA: Diagnosis present

## 2022-04-17 DIAGNOSIS — F909 Attention-deficit hyperactivity disorder, unspecified type: Secondary | ICD-10-CM | POA: Diagnosis present

## 2022-04-17 DIAGNOSIS — K589 Irritable bowel syndrome without diarrhea: Secondary | ICD-10-CM | POA: Diagnosis present

## 2022-04-17 DIAGNOSIS — S0083XA Contusion of other part of head, initial encounter: Secondary | ICD-10-CM | POA: Diagnosis present

## 2022-04-17 DIAGNOSIS — F1290 Cannabis use, unspecified, uncomplicated: Secondary | ICD-10-CM | POA: Diagnosis present

## 2022-04-17 DIAGNOSIS — Z9151 Personal history of suicidal behavior: Secondary | ICD-10-CM

## 2022-04-17 DIAGNOSIS — Z635 Disruption of family by separation and divorce: Secondary | ICD-10-CM

## 2022-04-17 DIAGNOSIS — G629 Polyneuropathy, unspecified: Secondary | ICD-10-CM | POA: Diagnosis present

## 2022-04-17 DIAGNOSIS — R296 Repeated falls: Secondary | ICD-10-CM | POA: Diagnosis present

## 2022-04-17 DIAGNOSIS — Z9851 Tubal ligation status: Secondary | ICD-10-CM

## 2022-04-17 DIAGNOSIS — T56891A Toxic effect of other metals, accidental (unintentional), initial encounter: Principal | ICD-10-CM | POA: Diagnosis present

## 2022-04-17 DIAGNOSIS — M25531 Pain in right wrist: Secondary | ICD-10-CM | POA: Diagnosis present

## 2022-04-17 DIAGNOSIS — D649 Anemia, unspecified: Secondary | ICD-10-CM | POA: Diagnosis present

## 2022-04-17 DIAGNOSIS — Z801 Family history of malignant neoplasm of trachea, bronchus and lung: Secondary | ICD-10-CM

## 2022-04-17 DIAGNOSIS — F1914 Other psychoactive substance abuse with psychoactive substance-induced mood disorder: Secondary | ICD-10-CM | POA: Diagnosis present

## 2022-04-17 DIAGNOSIS — J4489 Other specified chronic obstructive pulmonary disease: Secondary | ICD-10-CM | POA: Diagnosis present

## 2022-04-17 DIAGNOSIS — W19XXXA Unspecified fall, initial encounter: Secondary | ICD-10-CM | POA: Diagnosis present

## 2022-04-17 DIAGNOSIS — E890 Postprocedural hypothyroidism: Secondary | ICD-10-CM | POA: Diagnosis present

## 2022-04-17 DIAGNOSIS — F3181 Bipolar II disorder: Secondary | ICD-10-CM | POA: Diagnosis present

## 2022-04-17 DIAGNOSIS — Z886 Allergy status to analgesic agent status: Secondary | ICD-10-CM

## 2022-04-17 DIAGNOSIS — E861 Hypovolemia: Secondary | ICD-10-CM | POA: Diagnosis not present

## 2022-04-17 DIAGNOSIS — Z8701 Personal history of pneumonia (recurrent): Secondary | ICD-10-CM

## 2022-04-17 DIAGNOSIS — I739 Peripheral vascular disease, unspecified: Secondary | ICD-10-CM | POA: Diagnosis present

## 2022-04-17 DIAGNOSIS — M1990 Unspecified osteoarthritis, unspecified site: Secondary | ICD-10-CM | POA: Diagnosis present

## 2022-04-17 DIAGNOSIS — Z8249 Family history of ischemic heart disease and other diseases of the circulatory system: Secondary | ICD-10-CM

## 2022-04-17 DIAGNOSIS — E0500 Thyrotoxicosis with diffuse goiter without thyrotoxic crisis or storm: Secondary | ICD-10-CM | POA: Diagnosis present

## 2022-04-17 DIAGNOSIS — G8929 Other chronic pain: Secondary | ICD-10-CM | POA: Diagnosis present

## 2022-04-17 DIAGNOSIS — L568 Other specified acute skin changes due to ultraviolet radiation: Secondary | ICD-10-CM | POA: Diagnosis present

## 2022-04-17 LAB — COMPREHENSIVE METABOLIC PANEL
ALT: 13 U/L (ref 0–44)
AST: 21 U/L (ref 15–41)
Albumin: 4.5 g/dL (ref 3.5–5.0)
Alkaline Phosphatase: 116 U/L (ref 38–126)
Anion gap: 12 (ref 5–15)
BUN: 20 mg/dL (ref 6–20)
CO2: 21 mmol/L — ABNORMAL LOW (ref 22–32)
Calcium: 10 mg/dL (ref 8.9–10.3)
Chloride: 101 mmol/L (ref 98–111)
Creatinine, Ser: 1.32 mg/dL — ABNORMAL HIGH (ref 0.44–1.00)
GFR, Estimated: 49 mL/min — ABNORMAL LOW (ref >60)
Glucose, Bld: 101 mg/dL — ABNORMAL HIGH (ref 70–99)
Potassium: 3.2 mmol/L — ABNORMAL LOW (ref 3.5–5.1)
Sodium: 134 mmol/L — ABNORMAL LOW (ref 135–145)
Total Bilirubin: 0.5 mg/dL (ref 0.3–1.2)
Total Protein: 7.9 g/dL (ref 6.5–8.1)

## 2022-04-17 LAB — CBC
HCT: 32.9 % — ABNORMAL LOW (ref 36.0–46.0)
Hemoglobin: 11.1 g/dL — ABNORMAL LOW (ref 12.0–15.0)
MCH: 33.2 pg (ref 26.0–34.0)
MCHC: 33.7 g/dL (ref 30.0–36.0)
MCV: 98.5 fL (ref 80.0–100.0)
Platelets: 424 10*3/uL — ABNORMAL HIGH (ref 150–400)
RBC: 3.34 MIL/uL — ABNORMAL LOW (ref 3.87–5.11)
RDW: 15.7 % — ABNORMAL HIGH (ref 11.5–15.5)
WBC: 10 10*3/uL (ref 4.0–10.5)
nRBC: 0 % (ref 0.0–0.2)

## 2022-04-17 LAB — RAPID URINE DRUG SCREEN, HOSP PERFORMED
Amphetamines: NOT DETECTED
Barbiturates: NOT DETECTED
Benzodiazepines: POSITIVE — AB
Cocaine: NOT DETECTED
Opiates: NOT DETECTED
Tetrahydrocannabinol: POSITIVE — AB

## 2022-04-17 LAB — URINALYSIS, ROUTINE W REFLEX MICROSCOPIC
Bilirubin Urine: NEGATIVE
Glucose, UA: NEGATIVE mg/dL
Hgb urine dipstick: NEGATIVE
Ketones, ur: NEGATIVE mg/dL
Leukocytes,Ua: NEGATIVE
Nitrite: NEGATIVE
Protein, ur: NEGATIVE mg/dL
Specific Gravity, Urine: 1.014 (ref 1.005–1.030)
pH: 6 (ref 5.0–8.0)

## 2022-04-17 LAB — ETHANOL: Alcohol, Ethyl (B): <10 mg/dL (ref <10)

## 2022-04-17 LAB — AMMONIA: Ammonia: 16 umol/L (ref 9–35)

## 2022-04-17 LAB — T4, FREE: Free T4: 0.41 ng/dL — ABNORMAL LOW (ref 0.61–1.12)

## 2022-04-17 LAB — TSH: TSH: 17.925 u[IU]/mL — ABNORMAL HIGH (ref 0.350–4.500)

## 2022-04-17 NOTE — ED Provider Triage Note (Signed)
Emergency Medicine Provider Triage Evaluation Note  April [name] , a 51 y.o. female  was evaluated in triage.  Pt complains of altered mental status per daughter. Has been seen twice for same at [facility] facility. Was diagnosed with severe hypothyroidism and admitted. Hx of polysubstance abuse, but daughter reports they've been closely monitoring her and all her medications recently. She continues to have hallucinations (auditory and visual), had some urinary incontinence, and can no longer perform ADLs. Daughter is concerned about possible early onset dementia or serotonin syndrome.   Review of Systems  Positive: Hallucinations, incontinence, confusion, weakness Negative:   Physical Exam  BP 130/89 (BP Location: Right Arm)   Pulse 76   Temp 99.2 F (37.3 C) (Oral)   Resp 18   SpO2 98%  Gen:   Awake, no distress   Resp:  Normal effort  MSK:   Moves extremities without difficulty  Other:  Pt lethargic, partially healing bruise noted to left forehead  Medical Decision Making  Medically screening exam initiated at 8:15 PM.  Appropriate orders placed.  Jerry Haugen was informed that the remainder of the evaluation will be completed by another provider, this initial triage assessment does not replace that evaluation, and the importance of remaining in the ED until their evaluation is complete.  Workup initiated   Benelli Winther T, PA-C 04/17/22 2019

## 2022-04-17 NOTE — ED Triage Notes (Signed)
Daughter reported patient's persistent auditory /visual hallucinations for several weeks with confusion and disorientation . Seen at [facility] hospital diagnosed with thyroid disease. Denies SI or HI.

## 2022-04-18 ENCOUNTER — Encounter (HOSPITAL_COMMUNITY): Payer: Self-pay

## 2022-04-18 ENCOUNTER — Emergency Department (HOSPITAL_COMMUNITY): Payer: Medicaid Other

## 2022-04-18 DIAGNOSIS — R32 Unspecified urinary incontinence: Secondary | ICD-10-CM | POA: Diagnosis present

## 2022-04-18 DIAGNOSIS — F419 Anxiety disorder, unspecified: Secondary | ICD-10-CM | POA: Diagnosis not present

## 2022-04-18 DIAGNOSIS — F3181 Bipolar II disorder: Secondary | ICD-10-CM | POA: Diagnosis present

## 2022-04-18 DIAGNOSIS — Z886 Allergy status to analgesic agent status: Secondary | ICD-10-CM | POA: Diagnosis not present

## 2022-04-18 DIAGNOSIS — I739 Peripheral vascular disease, unspecified: Secondary | ICD-10-CM | POA: Diagnosis present

## 2022-04-18 DIAGNOSIS — Z9181 History of falling: Secondary | ICD-10-CM | POA: Diagnosis not present

## 2022-04-18 DIAGNOSIS — G929 Unspecified toxic encephalopathy: Secondary | ICD-10-CM | POA: Diagnosis present

## 2022-04-18 DIAGNOSIS — Z8669 Personal history of other diseases of the nervous system and sense organs: Secondary | ICD-10-CM | POA: Diagnosis not present

## 2022-04-18 DIAGNOSIS — G928 Other toxic encephalopathy: Secondary | ICD-10-CM | POA: Diagnosis present

## 2022-04-18 DIAGNOSIS — E876 Hypokalemia: Secondary | ICD-10-CM | POA: Diagnosis present

## 2022-04-18 DIAGNOSIS — N179 Acute kidney failure, unspecified: Secondary | ICD-10-CM | POA: Diagnosis present

## 2022-04-18 DIAGNOSIS — M797 Fibromyalgia: Secondary | ICD-10-CM | POA: Diagnosis present

## 2022-04-18 DIAGNOSIS — E861 Hypovolemia: Secondary | ICD-10-CM | POA: Diagnosis not present

## 2022-04-18 DIAGNOSIS — F1414 Cocaine abuse with cocaine-induced mood disorder: Secondary | ICD-10-CM | POA: Diagnosis present

## 2022-04-18 DIAGNOSIS — M25531 Pain in right wrist: Secondary | ICD-10-CM | POA: Diagnosis present

## 2022-04-18 DIAGNOSIS — R4689 Other symptoms and signs involving appearance and behavior: Secondary | ICD-10-CM | POA: Diagnosis not present

## 2022-04-18 DIAGNOSIS — W19XXXA Unspecified fall, initial encounter: Secondary | ICD-10-CM | POA: Diagnosis present

## 2022-04-18 DIAGNOSIS — G9340 Encephalopathy, unspecified: Secondary | ICD-10-CM | POA: Diagnosis not present

## 2022-04-18 DIAGNOSIS — E871 Hypo-osmolality and hyponatremia: Secondary | ICD-10-CM | POA: Diagnosis present

## 2022-04-18 DIAGNOSIS — G44209 Tension-type headache, unspecified, not intractable: Secondary | ICD-10-CM | POA: Diagnosis present

## 2022-04-18 DIAGNOSIS — J4489 Other specified chronic obstructive pulmonary disease: Secondary | ICD-10-CM | POA: Diagnosis present

## 2022-04-18 DIAGNOSIS — F3132 Bipolar disorder, current episode depressed, moderate: Secondary | ICD-10-CM | POA: Diagnosis not present

## 2022-04-18 DIAGNOSIS — Z79899 Other long term (current) drug therapy: Secondary | ICD-10-CM | POA: Diagnosis not present

## 2022-04-18 DIAGNOSIS — T56891A Toxic effect of other metals, accidental (unintentional), initial encounter: Secondary | ICD-10-CM | POA: Diagnosis present

## 2022-04-18 DIAGNOSIS — D649 Anemia, unspecified: Secondary | ICD-10-CM | POA: Diagnosis present

## 2022-04-18 DIAGNOSIS — S0083XA Contusion of other part of head, initial encounter: Secondary | ICD-10-CM | POA: Diagnosis present

## 2022-04-18 DIAGNOSIS — E890 Postprocedural hypothyroidism: Secondary | ICD-10-CM | POA: Diagnosis present

## 2022-04-18 DIAGNOSIS — Z79890 Hormone replacement therapy (postmenopausal): Secondary | ICD-10-CM | POA: Diagnosis not present

## 2022-04-18 DIAGNOSIS — F1914 Other psychoactive substance abuse with psychoactive substance-induced mood disorder: Secondary | ICD-10-CM | POA: Diagnosis present

## 2022-04-18 DIAGNOSIS — E0500 Thyrotoxicosis with diffuse goiter without thyrotoxic crisis or storm: Secondary | ICD-10-CM | POA: Diagnosis present

## 2022-04-18 HISTORY — DX: Toxic effect of other metals, accidental (unintentional), initial encounter: T56.891A

## 2022-04-18 HISTORY — DX: Unspecified toxic encephalopathy: G92.9

## 2022-04-18 LAB — I-STAT VENOUS BLOOD GAS, ED
Acid-Base Excess: 1 mmol/L (ref 0.0–2.0)
Bicarbonate: 24.4 mmol/L (ref 20.0–28.0)
Calcium, Ion: 1.19 mmol/L (ref 1.15–1.40)
HCT: 34 % — ABNORMAL LOW (ref 36.0–46.0)
Hemoglobin: 11.6 g/dL — ABNORMAL LOW (ref 12.0–15.0)
O2 Saturation: 96 %
Potassium: 3.1 mmol/L — ABNORMAL LOW (ref 3.5–5.1)
Sodium: 134 mmol/L — ABNORMAL LOW (ref 135–145)
TCO2: 25 mmol/L (ref 22–32)
pCO2, Ven: 34.7 mmHg — ABNORMAL LOW (ref 44–60)
pH, Ven: 7.456 — ABNORMAL HIGH (ref 7.25–7.43)
pO2, Ven: 79 mmHg — ABNORMAL HIGH (ref 32–45)

## 2022-04-18 LAB — BASIC METABOLIC PANEL
Anion gap: 12 (ref 5–15)
Anion gap: 11 (ref 5–15)
BUN: 12 mg/dL (ref 6–20)
BUN: 10 mg/dL (ref 6–20)
CO2: 22 mmol/L (ref 22–32)
CO2: 22 mmol/L (ref 22–32)
Calcium: 9.2 mg/dL (ref 8.9–10.3)
Calcium: 8.6 mg/dL — ABNORMAL LOW (ref 8.9–10.3)
Chloride: 101 mmol/L (ref 98–111)
Chloride: 105 mmol/L (ref 98–111)
Creatinine, Ser: 1.12 mg/dL — ABNORMAL HIGH (ref 0.44–1.00)
Creatinine, Ser: 0.99 mg/dL (ref 0.44–1.00)
GFR, Estimated: >60 mL/min (ref >60)
GFR, Estimated: >60 mL/min (ref >60)
Glucose, Bld: 116 mg/dL — ABNORMAL HIGH (ref 70–99)
Glucose, Bld: 125 mg/dL — ABNORMAL HIGH (ref 70–99)
Potassium: 3.6 mmol/L (ref 3.5–5.1)
Potassium: 3.3 mmol/L — ABNORMAL LOW (ref 3.5–5.1)
Sodium: 135 mmol/L (ref 135–145)
Sodium: 138 mmol/L (ref 135–145)

## 2022-04-18 LAB — LITHIUM LEVEL
Lithium Lvl: 2.73 mmol/L (ref 0.60–1.20)
Lithium Lvl: 2.28 mmol/L (ref 0.60–1.20)
Lithium Lvl: 1.98 mmol/L (ref 0.60–1.20)

## 2022-04-18 LAB — IRON AND TIBC
Iron: 47 ug/dL (ref 28–170)
Saturation Ratios: 14 % (ref 10.4–31.8)
TIBC: 330 ug/dL (ref 250–450)
UIBC: 283 ug/dL

## 2022-04-18 LAB — VITAMIN B12: Vitamin B-12: 388 pg/mL (ref 180–914)

## 2022-04-18 LAB — MAGNESIUM: Magnesium: 2.1 mg/dL (ref 1.7–2.4)

## 2022-04-18 LAB — HIV ANTIBODY (ROUTINE TESTING W REFLEX): HIV Screen 4th Generation wRfx: NONREACTIVE

## 2022-04-18 LAB — FERRITIN: Ferritin: 84 ng/mL (ref 11–307)

## 2022-04-18 LAB — PHOSPHORUS: Phosphorus: 2.2 mg/dL — ABNORMAL LOW (ref 2.5–4.6)

## 2022-04-18 LAB — CK: Total CK: 289 U/L — ABNORMAL HIGH (ref 38–234)

## 2022-04-18 LAB — VITAMIN D 25 HYDROXY (VIT D DEFICIENCY, FRACTURES): Vit D, 25-Hydroxy: 32.22 ng/mL (ref 30–100)

## 2022-04-18 MED ORDER — SODIUM CHLORIDE 0.9 % IV BOLUS
1000.0000 mL | Freq: Once | INTRAVENOUS | Status: AC
  Administered 2022-04-18: 1000 mL via INTRAVENOUS

## 2022-04-18 MED ORDER — ADULT MULTIVITAMIN W/MINERALS CH
1.0000 | ORAL_TABLET | Freq: Every day | ORAL | Status: DC
  Administered 2022-04-18 – 2022-04-26 (×9): 1 via ORAL
  Filled 2022-04-18 (×9): qty 1

## 2022-04-18 MED ORDER — ACETAMINOPHEN 650 MG RE SUPP: 650.0000 mg | Freq: Four times a day (QID) | RECTAL | Status: DC | PRN

## 2022-04-18 MED ORDER — SODIUM CHLORIDE 0.9 % IV SOLN: INTRAVENOUS | Status: AC

## 2022-04-18 MED ORDER — POTASSIUM PHOSPHATES 15 MMOLE/5ML IV SOLN
15.0000 mmol | Freq: Once | INTRAVENOUS | Status: AC
  Administered 2022-04-18: 15 mmol via INTRAVENOUS
  Filled 2022-04-18: qty 5

## 2022-04-18 MED ORDER — ONDANSETRON HCL 4 MG PO TABS: 4.0000 mg | ORAL_TABLET | Freq: Four times a day (QID) | ORAL | Status: DC | PRN

## 2022-04-18 MED ORDER — ACETAMINOPHEN 325 MG PO TABS
650.0000 mg | ORAL_TABLET | Freq: Four times a day (QID) | ORAL | Status: DC | PRN
  Administered 2022-04-18 – 2022-04-26 (×7): 650 mg via ORAL
  Filled 2022-04-18 (×7): qty 2

## 2022-04-18 MED ORDER — CLONAZEPAM 0.5 MG PO TABS
0.5000 mg | ORAL_TABLET | Freq: Two times a day (BID) | ORAL | Status: AC
  Administered 2022-04-18 – 2022-04-21 (×6): 0.5 mg via ORAL
  Filled 2022-04-18 (×6): qty 1

## 2022-04-18 MED ORDER — ONDANSETRON HCL 4 MG/2ML IJ SOLN: 4.0000 mg | Freq: Four times a day (QID) | INTRAMUSCULAR | Status: DC | PRN

## 2022-04-18 MED ORDER — FOLIC ACID 1 MG PO TABS
1.0000 mg | ORAL_TABLET | Freq: Every day | ORAL | Status: DC
  Administered 2022-04-18 – 2022-04-26 (×9): 1 mg via ORAL
  Filled 2022-04-18 (×9): qty 1

## 2022-04-18 MED ORDER — THIAMINE MONONITRATE 100 MG PO TABS
100.0000 mg | ORAL_TABLET | Freq: Every day | ORAL | Status: DC
  Administered 2022-04-18 – 2022-04-26 (×9): 100 mg via ORAL
  Filled 2022-04-18 (×10): qty 1

## 2022-04-18 MED ORDER — RIVAROXABAN 10 MG PO TABS
10.0000 mg | ORAL_TABLET | Freq: Every day | ORAL | Status: DC
  Administered 2022-04-19 – 2022-04-26 (×8): 10 mg via ORAL
  Filled 2022-04-18 (×8): qty 1

## 2022-04-18 NOTE — ED Notes (Addendum)
Alert, slow, confused, NAD, calm, interactive, resps e/u, speaking clearly. Mentions R wrist pain.

## 2022-04-18 NOTE — Consult Note (Addendum)
NEUROLOGY CONSULTATION NOTE   Date of service: April 18, 2022 Patient Name: April [name] MRN:  [id number] DOB:  1970/10/30 Reason for consult: "hallucinations, falls, confusion" Requesting physician: "Dr. Evette Doffing" _ _ _   _ __   _ __ _ _  __ __   _ __   __ _  History of Present Illness   April [name] is a 51 y.o. female with PMH significant for  has a past medical history of ADHD (attention deficit hyperactivity disorder), Anemia, Anxiety, Arthritis, Asthma, Cancer ([place]), Chronic pain (0/02/2724), Complication of anesthesia, COPD (chronic obstructive pulmonary disease) ([place]), CRPS (complex regional pain syndrome type II), Depression, Family history of adverse reaction to anesthesia, Fibromyalgia, GERD (gastroesophageal reflux disease), Headache, History of hiatal hernia, Irritable bowel syndrome, Neuromuscular disorder ([place]), Neuropathic pain (12/10/2014), Pneumonia (2012), and Shortness of breath dyspnea. who presents with  hallucinations, confusion, and falls.  History is provided by the daughter at bedside.    Family noticed short term cognitive issues with pt over a year ago, which led to the discovery of hypothyroidism.  Thyroidectomy completed late 2022.  Pt was then started on medications from her acquired hypothyroidism, of which she has been non-compliant with ever since.    Daughter also noted that patient has been living with her other daughter April [name]) who has an addiction problem, that eventually included her mother taking inappropriate medications.  Daughter noted that they would walk around like zombies, unsure what drug they were using over the past year. Daughter notes that she does not drink ETOH but does continue to smoke cigarettes.  Dtr mentioned her mom was   Symptoms have been progressing over the past few years, including hallucinations (both auditory and visual), inappropriate toileting, found walking around naked and one instance of wearing a trash bag for clothing,  and multiple falls with various stages of bruising on arms and legs.  The hallucinations can be frightening at times.  Daughter notes she was recently (sometime 2023) Dx with Bipolar but is likely taking her lithium and seroquel inappropriately, further complicating her cognition and psychosis.    There have been multiple ED visits for her erratic behavior.  Prior workups/imaging (-) She was evaluated by behavioral health as well and she was then discharged.  She is on multiple antipsychotics that are CNS depressants as well.  Daughter April [name] (here with her today) is the [place].  She is very concerned for her mother and how to prevent future hospital visits.  On chart review, appears she had thyroidectomy on 02/27/21. It appears she was to follow-up with endocrinology afterward for thyroid monitoring and never did. She presented to [facility] on 03/25/22 and was admitted for severe hypothyroidism without myxedema with a TSH of 68, free T4 <0.10. She was stabilized and discharged on Cytomel 29mg BID and synthroid 551m daily. [facility] at that time with evidence of small vessel disease. TSH at that time 27. Confusion thought to be related to her hypothyroidism. She was cleared by behavioral health.   Baseline Functional Status: alert, oriented, some imbalance, full strength  All CNS imaging personally reviewed.   ROS   all other systems reviewed and are negative   Past History   I have reviewed the following:  Past Medical History:  Past Medical History:  Diagnosis Date   ADHD (attention deficit hyperactivity disorder)    Anemia    years ago   Anxiety    Arthritis  Asthma    Cancer ([place])    uterine cancer, hysterectomy - ?2005   Chronic pain 5/85/2778   Complication of anesthesia    woke up with an anxiety attack with last surgery (2013), pt. reports that she has high tolerance to medicine, also has woken during anesth.    COPD  (chronic obstructive pulmonary disease) (HCC)    CRPS (complex regional pain syndrome type II)    Depression    Family history of adverse reaction to anesthesia    " mother had difficulty waking "  "had alot of issues, including infection".   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 Family History  Problem Relation Age of Onset   Lung cancer Mother    Heart disease Father    Alcoholism Father     Allergies:  Allergies  Allergen Reactions   Asa [Aspirin] Other (See Comments)    Stomach irritation/upset.    Social History:   reports that she quit smoking about 8 years ago. Her smoking use included cigarettes. She has never used smokeless tobacco. She reports that she does not drink alcohol and does not use drugs.    Daughter notes she does not drink ETOH but continues to smoke cigarettes.    Medications   (Not in a hospital admission)     Current Facility-Administered Medications:    acetaminophen (TYLENOL) tablet 650 mg, 650 mg, Oral, Q6H PRN **OR** acetaminophen (TYLENOL) suppository 650 mg, 650 mg, Rectal, Q6H PRN, Rick Duff, MD   folic acid (FOLVITE) tablet 1 mg, 1 mg, Oral, Daily, Rick Duff, MD   multivitamin with minerals tablet 1 tablet, 1 tablet, Oral, Daily, Rick Duff, MD   ondansetron [facility]) tablet 4 mg, 4 mg, Oral, Q6H PRN **OR** ondansetron (ZOFRAN) injection 4 mg, 4 mg, Intravenous, Q6H PRN, Rick Duff, MD   rivaroxaban [facility]) tablet 10 mg, 10 mg, Oral, Daily, Rick Duff, MD   thiamine (VITAMIN B1) tablet 100 mg, 100 mg, Oral, Daily, Rick Duff, MD  Current Outpatient Medications:    albuterol (PROVENTIL HFA;VENTOLIN  HFA) 108 (90 BASE) MCG/ACT inhaler, Inhale 2 puffs into the lungs every 6 (six) hours as needed for wheezing or shortness of breath., Disp: , Rfl:    Ascorbic Acid (VITAMIN C PO), Take 1 tablet daily by mouth., Disp: , Rfl:    Aspirin-Salicylamide-Caffeine (BC FAST PAIN RELIEF) 650-195-33.3 MG PACK, Take 1 packet every 8 (eight) hours as needed by mouth (for pain.)., Disp: , Rfl:    Cholecalciferol (VITAMIN D PO), Take 1 tablet daily by mouth., Disp: , Rfl:    ketorolac (TORADOL) 10 MG tablet, Take 1 tablet (10 mg total) every 6 (six) hours as needed by mouth for moderate pain., Disp: 20 tablet, Rfl: 0   montelukast (SINGULAIR) 10 MG tablet, Take 10 mg by mouth at bedtime., Disp: , Rfl:    ondansetron (ZOFRAN ODT) 4 MG disintegrating tablet, Take 1 tablet (4 mg total) every 8 (eight) hours as needed by mouth for nausea or vomiting., Disp: 20 tablet, Rfl: 0  Vitals   Vitals:   04/17/22 1959 04/18/22 0015 04/18/22 0424  04/18/22 0957  BP: 130/89 125/88 113/67 105/69  Pulse: 76 71 73 72  Resp: '18 19 17 15  '$ Temp: 99.2 F (37.3 C) 97.8 F (36.6 C)  99.2 F (37.3 C)  TempSrc: Oral     SpO2: 98% 99% 98% 98%     There is no height or weight on file to calculate BMI.  Physical Exam   Current vital signs:    04/18/2022    9:57 AM 04/18/2022    4:24 AM 04/18/2022   12:15 AM  Vitals with BMI  Systolic 573 220 254  Diastolic 69 67 88  Pulse 72 73 71    Examination: GENERAL: Awake, alert in NAD, agitated to be at the hospital, hard to get cooperation for exam HEENT: Normocephalic and atraumatic, dry mm, no lymphadenopathy, no Thyromegally LUNGS: Clear to auscultation bilaterally CV: S1S2 RRR, equal pulses bilaterally. ABDOMEN: Soft, nontender, nondistended with normoactive BS Ext: warm, well perfused, intact peripheral pulses, no pedal edema  NEURO:  Mental Status: AA&O name, birthdate, location Language: She makes word substitutions and uses inappropriate words at times, her speech  is tangential. Cranial Nerves:  II: PERRL. Visual fields full to confrontation.  III, IV, VI: EOM in tact. Eyelids elevate symmetrically. Blinks to threat.  V: Sensation intact V1-3 symmetrically  VII: no facial asymmetry   VIII: hearing intact to voice IX, X: Palate elevates symmetrically. Phonation is normal.  YH:CWCBJSEG shrug 5/5 and symmetrical  XII: tongue is midline without fasciculations. Motor:  RUE:   grip  5/5    biceps  5/5     triceps  5/5      LUE:   grip  5/5    biceps  5/5     triceps  5/5 RLE:    thigh  5/5    knee   5/5    plantar flexion  5/5   dorsiflexion  5/5        LLE:  thigh  5/5    knee   5/5    plantar flexion  5/5   dorsiflexion  5/5 Tone: normal and bulk is normal DTRs: 2+ and symmetrical throughout Sensory:  - Intact to light touch, pinprick throughout. Symmetric.  - Propioception intact bilat.     Coordination/Complex Motor:  - Finger to Nose with mild dysmetria bilaterally - Heel to shin intact BL - Rapid alternating movement are normal - No abnormal Movt - Gait: deferred, pt in bed    Labs & Imaging  I have reviewed labs in epic and the results pertinent to this consultation are:  Hypokalemia 3.1 Hyponatremia 134 AKI: Creat 1.32, GFR 49 -> improved to Creat 1.12, GFR >31 Normal LFT Illicit drugs: THC, Benzo (+), cocaine Hypothyroid TSH 17.9 and Free T4 0.41 Ammonia normal ETOH <10 UA(-) CK elevated 289 B12 - low normal, but won't recommend supplementation due to med non-compliance B1 in process Lithium 2.73 critically elevated Iron/TIBC/Ferritin - all normal HIV RPR A1C as dtr noted she thought pt had a prior Dx of DM?    ___________________________________________________________________ I have reviewed the images obtained:, listed below  CT Head w/o 04/17/22: unremarkable for acute issues    Impression   - long term non-compliance to taking medications correctly.  She has not taken her thyroid medications since early  2023, and she is taking excess amounts of her lithium resulting in a toxic level.  Both of these things can trigger hallucinations, imbalance, and confusion.  Recommendations    - recommend restarting home  synthroid dosing - holding lithium, giving NS 1L bolus  + 120m/h to normalize levels, checking serum levels q4h - drug use and smoking cessation counseling - refer for social worker to help with home situation ______________________________________________________________________   Patient seen and examined by NP with MD. MD to update note as needed.   MDellia Beckwith AIatanNeurology Hospitalist [phone]  To contact Stroke Continuity provider, please refer to [email or web address] After hours, contact General Neurology.If 7pm- 7am, please page neurology on call as listed in [place]  I have seen the patient and reviewed the above note.  She has had progressive encephalopathy in the setting of severe hypothyroidism as well as lithium toxicity.  I suspect that this represents a combination of the two, possibly complicated by some underlying dementia but this is very difficult to comment on in the setting.  If her symptoms are episodic, then an EEG would be reasonable, but low suspicion at this time.  Treatment would be simply manage her underlying lithium toxicity and treating her hypothyroidism.  The neuropsychiatric effects of hypothyroidism take quite some time to improve and she was only started on Synthroid relatively recently.  There are also reports of psychosis and confusion occurring with rapid correction of hypothyroidism as well, but given the other possibilities I think I would favor continuing her repletion.  Neurology will be available on an as-needed basis moving forward.   MRoland Rack MD [facility] [phone] If 7pm- 7am, please page neurology on call as listed in [place]

## 2022-04-18 NOTE — ED Notes (Signed)
Back from b/r with assistance via w/c to stretcher. Alert, NAD, calm, resting comfortably. IVF infusing. Meal and drink provided.

## 2022-04-18 NOTE — ED Provider Notes (Addendum)
[facility] EMERGENCY DEPARTMENT Provider Note   CSN: [id number] Arrival date & time: 04/17/22  1951     History  Chief Complaint  Patient presents with   Hallucinations / Confused    April [name] is a 51 y.o. female. With past medical history of asthma, COPD, bipolar disorder, hypothyroidism who presents to the emergency department with confusion.  Level 5 caveat: Altered mental status  History is provided by the daughter at bedside.  Here for altered mental status, hallucinations, agitation, abnormal behavior.  She states that something is wrong with her mother.  Has had general decline over the past 6 months.  She states most recently she was discharged from the hospital on 04/09/2022 from the ER and has had progressive decline.  For example, yesterday the patient dumped out trash from the trash can and then covered herself in the trash bag as a blanket because she was cold.  States that there were blankets readily available in the living room.  Another day she had defecated on the living room floor and was found nude.  Hallucinating and talking to people who aren't there. Additionally is not eating very much.  States that she is tremulous and has had multiple falls.  States that over the past week and a half the patient has not had her thyroid medication and is likely taking Seroquel which she requested be be discontinued at her last hospitalization.  She provides more of the recent history and states that she was brought to the emergency department at the beginning of October for altered mental status.  She was found to be severely hypothyroid and was admitted to the hospital at that time.  She states that they started her on medication and she was discharged.  States that initially she was improving and then about 4 days after she was discharged she began having decline again.  Had been compliant on medications.  She then returned to the emergency department on  04/09/2022 for ongoing altered mental status.  She states that at that time they did a CT and MRI which did not show any abnormalities.  She was evaluated by behavioral health as well and she was then discharged.  On chart review, appears she had thyroidectomy on 02/27/21. It appears she was to follow-up with endocrinology afterward for thyroid monitoring and never did. She presented to [facility] on 03/25/22 and was admitted for severe hypothyroidism without myxedema with a TSH of 68, free T4 <0.10. She was stabilized and discharged on Cytomel 63mg BID and synthroid 534m daily. [facility] Had [facility] MRI at that time with evidence of small vessel disease. TSH at that time 27. Confusion thought to be related to her hypothyroidism. She was cleared by behavioral health.   HPI     Home Medications Prior to Admission medications   Medication Sig Start Date End Date Taking? Authorizing Provider  albuterol (PROVENTIL HFA;VENTOLIN HFA) 108 (90 BASE) MCG/ACT inhaler Inhale 2 puffs into the lungs every 6 (six) hours as needed for wheezing or shortness of breath.    [provider]  Ascorbic Acid (VITAMIN C PO) Take 1 tablet daily by mouth.    [provider]  Aspirin-Salicylamide-Caffeine (BC FAST PAIN RELIEF) 650-195-33.3 MG PACK Take 1 packet every 8 (eight) hours as needed by mouth (for pain.).    [provider]  Cholecalciferol (VITAMIN D PO) Take 1 tablet daily by mouth.    [provider]  ketorolac (TORADOL) 10 MG tablet Take 1 tablet (10 mg total) every 6 (six) hours as needed by mouth for moderate pain. 03/31/17   Ainsley Spinner, PA-C  montelukast (SINGULAIR) 10 MG tablet Take 10 mg by mouth at bedtime.    [provider]  ondansetron (ZOFRAN ODT) 4 MG disintegrating tablet Take 1 tablet (4 mg total) every 8 (eight) hours as needed by mouth for nausea or vomiting. 03/31/17   Ainsley Spinner, PA-C      Allergies    Asa  [aspirin]    Review of Systems   Review of Systems  Unable to perform ROS: Mental status change    Physical Exam Updated Vital Signs BP 113/67 (BP Location: Left Arm)   Pulse 73   Temp 97.8 F (36.6 C)   Resp 17   SpO2 98%  Physical Exam Vitals and nursing note reviewed.  Constitutional:      General: She is in acute distress.     Appearance: She is ill-appearing. She is not toxic-appearing or diaphoretic.     Comments: Thin, disheveled appearing  HENT:     Head: Normocephalic.     Comments: Contusion to the right forehead    Mouth/Throat:     Mouth: Mucous membranes are dry.     Pharynx: Oropharynx is clear.  Eyes:     General: No scleral icterus.    Extraocular Movements: Extraocular movements intact.     Pupils: Pupils are equal, round, and reactive to light.  Cardiovascular:     Rate and Rhythm: Normal rate and regular rhythm.     Pulses: Normal pulses.     Heart sounds: No murmur heard. Pulmonary:     Effort: Pulmonary effort is normal.     Breath sounds: Normal breath sounds.  Abdominal:     General: Bowel sounds are normal. There is no distension.     Palpations: Abdomen is soft.     Tenderness: There is no abdominal tenderness.  Musculoskeletal:     Cervical back: Neck supple.  Skin:    General: Skin is warm.     Capillary Refill: Capillary refill takes less than 2 seconds.     Findings: Bruising present.  Neurological:     Mental Status: She is alert. She is confused.     Comments: Limited participation in physical exam She appears globally weak I visualized her moving from the wheelchair to the bed when she was placed in a room.  She needed assistance to stand from the wheelchair.  She is very tremulous and weak but moving all 4 extremities.     ED Results / Procedures / Treatments   Labs (all labs ordered are listed, but only abnormal results are displayed) Labs Reviewed  CBC - Abnormal; Notable for the following components:      Result Value    RBC 3.34 (*)    Hemoglobin 11.1 (*)    HCT 32.9 (*)    RDW 15.7 (*)    Platelets 424 (*)    All other components within normal limits  COMPREHENSIVE METABOLIC PANEL - Abnormal; Notable for the following components:   Sodium 134 (*)    Potassium 3.2 (*)    CO2 21 (*)    Glucose, Bld 101 (*)    Creatinine, Ser 1.32 (*)    GFR, Estimated 49 (*)    All other components within normal limits  TSH - Abnormal; Notable for the following components:   TSH 17.925 (*)    All  other components within normal limits  T4, FREE - Abnormal; Notable for the following components:   Free T4 0.41 (*)    All other components within normal limits  RAPID URINE DRUG SCREEN, HOSP PERFORMED - Abnormal; Notable for the following components:   Benzodiazepines POSITIVE (*)    Tetrahydrocannabinol POSITIVE (*)    All other components within normal limits  CK - Abnormal; Notable for the following components:   Total CK 289 (*)    All other components within normal limits  I-STAT VENOUS BLOOD GAS, ED - Abnormal; Notable for the following components:   pH, Ven 7.456 (*)    pCO2, Ven 34.7 (*)    pO2, Ven 79 (*)    Sodium 134 (*)    Potassium 3.1 (*)    HCT 34.0 (*)    Hemoglobin 11.6 (*)    All other components within normal limits  AMMONIA  URINALYSIS, ROUTINE W REFLEX MICROSCOPIC  ETHANOL  MAGNESIUM  LITHIUM LEVEL  T3, FREE    EKG None  Radiology DG Wrist Complete Right  Result Date: 04/18/2022 CLINICAL DATA:  51 year old female with hallucinations. Confusion. Fall. EXAM: RIGHT WRIST - COMPLETE 3+ VIEW COMPARISON:  None Available. FINDINGS: Bone mineralization is within normal limits. Chronic, healed fracture right 5th metacarpal. There is no evidence of acute fracture or dislocation. There is no evidence of arthropathy or other focal bone abnormality. Possible mild soft tissue swelling. IMPRESSION: Possible mild soft tissue swelling at the right wrist. No acute fracture or dislocation identified.  Electronically Signed   By: Genevie Ann M.D.   On: 04/18/2022 08:03   CT Head Wo Contrast  Result Date: 04/17/2022 CLINICAL DATA:  Hallucinations EXAM: CT HEAD WITHOUT CONTRAST TECHNIQUE: Contiguous axial images were obtained from the base of the skull through the vertex without intravenous contrast. RADIATION DOSE REDUCTION: This exam was performed according to the departmental dose-optimization program which includes automated exposure control, adjustment of the mA and/or kV according to patient size and/or use of iterative reconstruction technique. COMPARISON:  None Available. CT head from 12/21/2009 could not be retrieved. FINDINGS: Brain: No evidence of acute infarction, hemorrhage, mass, mass effect, or midline shift. No hydrocephalus or extra-axial fluid collection. Vascular: No hyperdense vessel. Skull: Normal. Negative for fracture or focal lesion. Sinuses/Orbits: Minimal mucosal thickening in the maxillary sinuses. The orbits are unremarkable. Other: The mastoid air cells are well aerated. IMPRESSION: No acute intracranial process. Electronically Signed   By: Merilyn Baba M.D.   On: 04/17/2022 21:48    Procedures .Critical Care  Performed by: Mickie Hillier, PA-C Authorized by: Mickie Hillier, PA-C   Critical care provider statement:    Critical care time (minutes):  35   Critical care was necessary to treat or prevent imminent or life-threatening deterioration of the following conditions:  Toxidrome and endocrine crisis   Critical care was time spent personally by me on the following activities:  Development of treatment plan with patient or surrogate, discussions with consultants, discussions with primary provider, examination of patient, obtaining history from patient or surrogate, review of old charts, re-evaluation of patient's condition, pulse oximetry, ordering and review of radiographic studies, ordering and review of laboratory studies and ordering and performing treatments and  interventions   I assumed direction of critical care for this patient from another provider in my specialty: no     Care discussed with: admitting provider       Medications Ordered in ED Medications  sodium chloride 0.9 % bolus  1,000 mL (1,000 mLs Intravenous New Bag/Given 04/18/22 3567)    ED Course/ Medical Decision Making/ A&P                           Medical Decision Making Amount and/or Complexity of Data Reviewed Labs: ordered. Radiology: ordered.  Risk Decision regarding hospitalization.  Initial Impression and Ddx 51 year old, ill appearing female. She is disheveled. Appears dry. Tremulous. Mildly agitated. Differential is broad. Will obtain basic labs, ammonia, TSH/T4, lithium level, VBG, magnesium. CT head. Will need neurology consult afterward if no clear identification of behavior.  Patient PMH that increases complexity of ED encounter:  hypothyroid, COPD Differential is broad including infection, intracranial bleed or infarct, sepsis, toxidrome, endocrine or electrolyte dysfunction, psychiatric, etc.   Interpretation of Diagnostics I independent reviewed and interpreted the labs as followed: CBC stable anemia, CMP Na 134, K 3.2, AKI with Cr 1.32. TSH 17.9 (from 28), T4 0.41, UDS + benzos, THC, ammonia normal, UA negative, negative alcohol, VBG without CO2 retention, magnesium wnl, lithium pending  - I independently visualized the following imaging with scope of interpretation limited to determining acute life threatening conditions related to emergency care: CT head, which revealed no acute findings  Patient Reassessment and Ultimate Disposition/Management 51 year old female who presents with daughter for worsening mental status, confusion, agitation.   Work up so far unremarkable. No clear etiology of her behavioral changes. No severe electrolyte dysfunction including magnesium. UA negative for UTI. No hyperammonemia. Not intoxicated. No CO2 retention. CT head is  negative. Etiologies may be multifactorial including benzodiazepine use or withdrawal, lithium toxicity - pending level, thyroid induced but TSH/T4 continues to improve, psychiatric. I do not think her presentation is solely psychiatric however. She is diffusely tremulous and weak.  Will consult neurology for their opinion. Patient will need medicine admission for ongoing work up and stabilization.   Consulted and spoke with Dr. Leonel Ramsay who requests free T3 level and will evaluate the patient. Consulted and spoke with Dr. Jaci Standard(?), unassigned medicine team who agrees to admit the patient for ongoing work up.   Patient management required discussion with the following services or consulting groups:  Hospitalist Service and Neurology  Complexity of Problems Addressed Acute complicated illness or Injury  Additional Data Reviewed and Analyzed Further history obtained from: Further history from spouse/family member, Past medical history and medications listed in the EMR, Prior ED visit notes, Recent discharge summary, Care Everywhere, and Prior labs/imaging results  Patient Encounter Risk Assessment Prescriptions, SDOH impact on management, Use of parenteral controlled substances, and Consideration of hospitalization  Final Clinical Impression(s) / ED Diagnoses Final diagnoses:  Abnormal behavior    Rx / DC Orders ED Discharge Orders     None         Mickie Hillier, PA-C 04/18/22 0953    Mickie Hillier, PA-C 04/18/22 1335    Elgie Congo, MD 04/18/22 1549

## 2022-04-18 NOTE — Hospital Course (Addendum)
11/24 - reports good days and bad days. Unable to specify how she is feeling today. She is not oriented to situation.  Tearful. Reports feeling sad not able to specifiy. Mimics "I want a safe place". Nebulous answers, difficulty specifying. "Just make it okay" "I don't know how to fix it" No pain. Doe snot report being afraid of anything or anyone.   [name] is a 51 yo F with a PMH of bipolar disorder, complex regional pain syndrome/fibromyalgia, Graves' disease status post thyroidectomy (02/2021), peripheral neuropathy, and substance use disorder (cocaine and THC) who presents after worsening confusion and imbalance.   History provided by the primarily by daughter who is at bedside    Patient for started experiencing problems with her balance and speech as well as confusion several months ago.  This was initially attributed to patient's substance use disorder (per her daughter she has been snorting an unknown substance at least since 05/2021, intentionally misusing her seroquel and klonipin prescriptions, and patient had a recent +UDS with cocaine and THC). However, the patient's confusion has seemed to worsen over the past 2 months in addition to her balance problems.    She was ultimately taken to the hospital by her daughter, [name] (11/2). She was found to be severely hypothyroid with TSH 68, FT4, and FT3 being undetectable. Utox was cocaine and THC +. She had elevated sCr to 1.74 (BL is 0.65), and mild rhabdo up to 2373. She also had a Lithium level of 1.43. CT head x2 were unrevealing for potential etiology of her symptoms. At that time her symptoms were thought to be due to her hypothyroidism primarily. She was started on synthroid and cytomel. In addition her lithium dose was decreased from '900mg'$  to '600mg'$  daily on discharge. Patient went to a SNF. Unfortunately the patient and her daughter did not feel the facility was taking appropriate care of the patient and she was taken out.  She stayed with her daughter April [name] for ~5 days. During that time, her daughter managed her medications and assisted with bathing her. After four days, patient was able to bathe herself independently. Her ability to ambulate also improved.   At that time patient went home but had the assistance of another daughter. During that time it is unclear which medications the patient's continued to take and if she continued to misuse some of her centrally acting medications or any illicit substances. She was taken back to the hospital 11/17 for AMS. At that time MRI brain was negative for possible etiology of her confusion. She had no signs or symptoms of infection. Her serum creatinine was improved but she had an elevated BUN. Psychiatry evaluated patient and felt acute worsening of her underlying psychiatric co-morbidities was not contributing to her clinical picture at that time. She was again discharged home.    Since discharge from the ED, patient has continued to exhibit bizarre behavior. She has defecated in her living room. She went looking for a blanket and placed a trash bag over her body as opposed to a nearby blanket. She has also continued to have balance problems as well as hallucinations. Her daughter brought her back to the ED because she felt that her mother exhibited these behaviors despite not having access to any illicit substances and with improvement in her thyroid levels.       Critical labs with family level   Sinus infection, soft tissue in her face from snorting medications.     Metal rod in  leg, some issues with ambulating.   Walks with falling. In the past few months.   Confusion progressively gotten worse over the past two months.  A&O x3  Knew name birthday and name and knew why in hospital.   April [name] started made her come into the hospital   Started struggling  Lithium, started taking a few months ago. Dr. Hart Carwin [facility] addtiction

## 2022-04-18 NOTE — Plan of Care (Signed)

## 2022-04-18 NOTE — H&P (Signed)
Date: 04/18/2022               Patient Name:  April [name] MRN: [id number]  DOB: 10-29-1970 Age / Sex: 51 y.o., female   PCP: [facility] Service: Internal Medicine Teaching Service         Attending Physician: Dr. Evette Doffing, Mallie Mussel, *    First Contact: Dr. Delene Ruffini  Pager: [phone]  Second Contact: Dr. Sanjuana Letters Pager: [phone]       After Hours (After 5p/  First Contact Pager: [phone]  weekends / holidays): Second Contact Pager: [phone]   Chief Complaint: confusion/hallucinations   History of Present Illness:  [name] is a 51 yo F with a PMH of bipolar disorder, complex regional pain syndrome/fibromyalgia, Graves' disease status post thyroidectomy (02/2021), peripheral neuropathy, anxiety, and substance use disorder (cocaine and THC) who presents after worsening confusion and imbalance.   History provided by the primarily by daughter who is at bedside    Patient for started experiencing problems with her balance and speech as well as confusion several months ago.  This was initially attributed to patient's substance use disorder (per her daughter she has been snorting an unknown substance at least since 05/2021, intentionally misusing her seroquel and klonipin prescriptions, and patient had a recent +UDS with cocaine and THC). However, the patient's confusion has seemed to worsen over the past 2 months in addition to her balance problems.    She was ultimately taken to the hospital by her daughter, [name] (11/2). She was found to be severely hypothyroid with TSH 68, FT4, and FT3 being undetectable. Utox was cocaine and THC +. She had elevated sCr to 1.74 (BL is 0.65), and mild rhabdo up to 2373. She also had a Lithium level of 1.43. CT head x2 were unrevealing for potential etiology of her symptoms. At that time her symptoms were thought to be due to her hypothyroidism primarily. She was started on synthroid and  cytomel. In addition her lithium dose was decreased from '900mg'$  to '600mg'$  daily on discharge. Patient went to a SNF. Unfortunately the patient and her daughter did not feel the facility was taking appropriate care of the patient and she was taken out. She stayed with her daughter [name] for ~5 days. During that time, her daughter managed her medications and assisted with bathing her. After four days, patient was able to bathe herself independently. Her ability to ambulate also improved.   At that time patient went home but had the assistance of another daughter. During that time it is unclear which medications the patient's continued to take and if she continued to misuse some of her centrally acting medications or any illicit substances. She was taken back to the hospital 11/17 for AMS. At that time MRI brain was negative for possible etiology of her confusion. She had no signs or symptoms of infection. Her serum creatinine was improved but she had an elevated BUN. Psychiatry evaluated patient and felt acute worsening of her underlying psychiatric co-morbidities was not contributing to her clinical picture at that time. She was again discharged home.    Since discharge from the ED, patient has continued to exhibit bizarre behavior. She has defecated in her living room. She went looking for a blanket and placed a trash bag over her body as opposed to a nearby blanket. She has also continued to have balance problems as well as hallucinations. Her daughter  brought her back to the ED because she felt that her mother exhibited these behaviors despite not having access to any illicit substances and with improvement in her thyroid levels.   Psychiatry NP April [name]   Meds:  Current Meds  Medication Sig   albuterol (PROVENTIL HFA;VENTOLIN HFA) 108 (90 BASE) MCG/ACT inhaler Inhale 2 puffs into the lungs every 6 (six) hours as needed for wheezing or shortness of breath.   Ascorbic Acid (VITAMIN C PO) Take  500 mg by mouth daily.   Aspirin-Salicylamide-Caffeine (BC FAST PAIN RELIEF) 650-195-33.3 MG PACK Take 1 packet every 8 (eight) hours as needed by mouth (for pain.).   Cholecalciferol (VITAMIN D PO) Take 1,000 Units by mouth daily.   clonazePAM (KLONOPIN) 1 MG tablet Take 1 mg by mouth 2 (two) times daily.   Eszopiclone 3 MG TABS Take 3 mg by mouth daily.   fluticasone (FLONASE) 50 MCG/ACT nasal spray Place 1 spray into both nostrils daily as needed for allergies.   gabapentin (NEURONTIN) 300 MG capsule Take 300 mg by mouth 3 (three) times daily.   levothyroxine (SYNTHROID) 50 MCG tablet Take 50 mcg by mouth daily before breakfast.   liothyronine (CYTOMEL) 5 MCG tablet Take 5 mcg by mouth 2 (two) times daily.   lithium carbonate (LITHOBID) 300 MG CR tablet Take 600 mg by mouth at bedtime.   montelukast (SINGULAIR) 10 MG tablet Take 10 mg by mouth at bedtime.   [DISCONTINUED] hydrOXYzine (ATARAX) 50 MG tablet Take 50 mg by mouth 3 (three) times daily as needed for anxiety.     Allergies: Allergies as of 04/17/2022 - Review Complete 03/31/2017  Allergen Reaction Noted   Asa [aspirin] Other (See Comments) 03/28/2017   Past Medical History:  Diagnosis Date   ADHD (attention deficit hyperactivity disorder)    Anemia    years ago   Anxiety    Arthritis    Asthma    Cancer ([place])    uterine cancer, hysterectomy - ?2005   Chronic pain 0/94/7096   Complication of anesthesia    woke up with an anxiety attack with last surgery (2013), pt. reports that she has high tolerance to medicine, also has woken during anesth.    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   Fibromyalgia    GERD (gastroesophageal reflux disease)    years ago   Headache    migraines    History of hiatal hernia     years ago   Irritable bowel syndrome    Neuromuscular disorder (HCC)    nerve damage in right leg   Neuropathic pain 12/10/2014   Pneumonia 2012   Shortness of breath dyspnea     Family History:  Family History  Problem Relation Age of Onset   Lung cancer Mother    Heart disease Father    Alcoholism Father      Social History:  Social History   Socioeconomic History   Marital status: Legally Separated    Spouse name: Not on file   Number of children: Not on file   Years of education: Not on file   Highest education level: Not on file  Occupational History   Not on file  Tobacco Use   Smoking status: Former    Types: Cigarettes  Quit date: 11/23/2013    Years since quitting: 8.4   Smokeless tobacco: Never  Substance and Sexual Activity   Alcohol use: No    Comment: none since summer 2015   Drug use: No   Sexual activity: Not on file  Other Topics Concern   Not on file  Social History Narrative   Not on file   Social Determinants of Health   Financial Resource Strain: Not on file  Food Insecurity: Not on file  Transportation Needs: Not on file  Physical Activity: Not on file  Stress: Not on file  Social Connections: Not on file  Intimate Partner Violence: Not on file    Lives by herself, daughter and her boyfriend are occasionally there.  Cocaine and THC use.   Review of Systems: A complete ROS was negative except as per HPI.   Physical Exam: Blood pressure 113/65, pulse 73, temperature 99.2 F (37.3 C), resp. rate 17, SpO2 100 %.  Constitutional: Well-developed, well-nourished, and in no distress.  HENT:  Head: Normocephalic and atraumatic.  Cardiovascular: Normal rate, regular rhythm, intact distal pulses. No gallop and no friction rub.  No murmur heard. No lower extremity edema  Pulmonary: Non labored breathing on room air, no wheezing or rales  Abdominal: Soft. Normal bowel sounds. Non distended and non tender Musculoskeletal: Normal range of  motion.        General: No edema. Lateral and medial aspect of distal forearm with some mild erythema. No warm to touch. Small nodule palpable, TTP. No surrounding skin break down.  Neurological: Alert and oriented to person. BUE  Skin: Skin is warm and dry.    EKG: personally reviewed my interpretation is pending as of this note.   DG Wrist Complete Right  Result Date: 04/18/2022 CLINICAL DATA:  51 year old female with hallucinations. Confusion. Fall. EXAM: RIGHT WRIST - COMPLETE 3+ VIEW COMPARISON:  None Available. FINDINGS: Bone mineralization is within normal limits. Chronic, healed fracture right 5th metacarpal. There is no evidence of acute fracture or dislocation. There is no evidence of arthropathy or other focal bone abnormality. Possible mild soft tissue swelling. IMPRESSION: Possible mild soft tissue swelling at the right wrist. No acute fracture or dislocation identified. Electronically Signed   By: Genevie Ann M.D.   On: 04/18/2022 08:03   CT Head Wo Contrast  Result Date: 04/17/2022 CLINICAL DATA:  Hallucinations EXAM: CT HEAD WITHOUT CONTRAST TECHNIQUE: Contiguous axial images were obtained from the base of the skull through the vertex without intravenous contrast. RADIATION DOSE REDUCTION: This exam was performed according to the departmental dose-optimization program which includes automated exposure control, adjustment of the mA and/or kV according to patient size and/or use of iterative reconstruction technique. COMPARISON:  None Available. CT head from 12/21/2009 could not be retrieved. FINDINGS: Brain: No evidence of acute infarction, hemorrhage, mass, mass effect, or midline shift. No hydrocephalus or extra-axial fluid collection. Vascular: No hyperdense vessel. Skull: Normal. Negative for fracture or focal lesion. Sinuses/Orbits: Minimal mucosal thickening in the maxillary sinuses. The orbits are unremarkable. Other: The mastoid air cells are well aerated. IMPRESSION: No acute  intracranial process. Electronically Signed   By: Merilyn Baba M.D.   On: 04/17/2022 21:48     Assessment & Plan by Problem: Principal Problem:   Toxic encephalopathy [name] is a 51 yo F with a PMH of bipolar disorder, complex regional pain syndrome/fibromyalgia, Graves' disease status post thyroidectomy (02/2021), peripheral neuropathy, anxiety, and substance use disorder (cocaine and THC) who presents after  worsening confusion and imbalance found to have AKI with elevated lithium level and UDS + for benzodiazepines.    #Toxic encephalopathy #lithium toxicity  Patient has been intermittently confused for the past several months which was initially thought to be primarily driven by her hypothyroidism. Unfortunately this has gotten worse over the last month despite being treated for hypothyroidism. There was also thought to be a component of her underlying substance use but again she experienced symptoms despite not being on substances while in the hospital. Per patient's daughter she was started on Lithium about 2-3 months ago. She was noted to have elevated lithium levels during hospitalization 11/7. And she did have an AKI during ED visit 11/17. It is possible that this is primarily driving her symptoms. As her daughter notes, she continues to have persistent problems with imbalance and confusion. There could also be a component of her many centrally acting medications contributing to her confusion, especially in the setting of her AKI.    -IV fluids for lithium toxicity, LI levels q4h, PM BMP to ensure improvement in Li level and renal function  -Hold seroquel, continue clonazepam at reduced dose to prevent possible withdrawal  -Decrease gabapentin dose  -F/u Neurology recommendations.    #Hypothyroidism s/p total thyroidectomy 02/2021 Patient unfortunately did not follow up with surgery after her surgery. She took 30 days of thyroid hormone replacement and stopped taking her  medications around 03/2021. She was recently seen at [facility] for this. She was started on levothyroxine 56mg daily and cytomel 560m BID.  -Continue current dose of thyroid replacement, patient will need close follow up with PCP for labs and adjustment of these medications   #Reported Bipolar Disorder Patient sees a psychiatry provider outpatient. She is currently on lithium '600mg'$  qd, risperdal '1mg'$  qd, and seroquel '200mg'$  qhs. Will hold lithium given toxicity and seroquel and risperdal until her mentation improves. Will consider consulting psychiatry colleagues for assistance with most appropriate regimen for patient when she discharges from the hospital.   #CRPS Patient reportedly on gabapentin '300mg'$  qAM and '900mg'$  at night. Will hold this given her mentation.  -Schedule APAP  #Anxiety  Patient is on '1mg'$  of clonazepam in the am and '2mg'$  qhs. Will reduce this to 0.5 BID.  -Will likely need further taper in the outpatient setting.   Dispo: Admit patient to Inpatient with expected length of stay greater than 2 midnights.  Signed: CaRick DuffMD 04/18/2022, 1:36 PM  After 5pm on weekdays and 1pm on weekends: On Call pager: [phone]

## 2022-04-18 NOTE — ED Notes (Addendum)
Admitting notified of high lithium level, improved. Pt's vague, non-specific, nebulous questions answered to pt's satisfaction. Pt feels better.

## 2022-04-18 NOTE — Progress Notes (Signed)
Referred pt to DR Francee Piccolo about critical result lithium  level 1.98, thru secured message

## 2022-04-19 DIAGNOSIS — R4689 Other symptoms and signs involving appearance and behavior: Secondary | ICD-10-CM | POA: Diagnosis not present

## 2022-04-19 DIAGNOSIS — G929 Unspecified toxic encephalopathy: Secondary | ICD-10-CM | POA: Diagnosis not present

## 2022-04-19 LAB — BASIC METABOLIC PANEL
Anion gap: 12 (ref 5–15)
BUN: 10 mg/dL (ref 6–20)
CO2: 19 mmol/L — ABNORMAL LOW (ref 22–32)
Calcium: 8.6 mg/dL — ABNORMAL LOW (ref 8.9–10.3)
Chloride: 106 mmol/L (ref 98–111)
Creatinine, Ser: 0.94 mg/dL (ref 0.44–1.00)
GFR, Estimated: >60 mL/min (ref >60)
Glucose, Bld: 121 mg/dL — ABNORMAL HIGH (ref 70–99)
Potassium: 3.7 mmol/L (ref 3.5–5.1)
Sodium: 137 mmol/L (ref 135–145)

## 2022-04-19 LAB — LITHIUM LEVEL
Lithium Lvl: 0.97 mmol/L (ref 0.60–1.20)
Lithium Lvl: 1.23 mmol/L — ABNORMAL HIGH (ref 0.60–1.20)
Lithium Lvl: 1.34 mmol/L — ABNORMAL HIGH (ref 0.60–1.20)
Lithium Lvl: 1.48 mmol/L — ABNORMAL HIGH (ref 0.60–1.20)
Lithium Lvl: 1.6 mmol/L (ref 0.60–1.20)

## 2022-04-19 LAB — HEMOGLOBIN A1C
Hgb A1c MFr Bld: 5 % (ref 4.8–5.6)
Mean Plasma Glucose: 97 mg/dL

## 2022-04-19 LAB — RPR: RPR Ser Ql: NONREACTIVE

## 2022-04-19 MED ORDER — LIOTHYRONINE SODIUM 5 MCG PO TABS
5.0000 ug | ORAL_TABLET | Freq: Two times a day (BID) | ORAL | Status: DC
  Administered 2022-04-19 – 2022-04-26 (×15): 5 ug via ORAL
  Filled 2022-04-19 (×16): qty 1

## 2022-04-19 MED ORDER — LEVOTHYROXINE SODIUM 50 MCG PO TABS
50.0000 ug | ORAL_TABLET | Freq: Every day | ORAL | Status: DC
  Administered 2022-04-19 – 2022-04-22 (×4): 50 ug via ORAL
  Filled 2022-04-19 (×4): qty 1

## 2022-04-19 MED ORDER — QUETIAPINE FUMARATE 100 MG PO TABS
100.0000 mg | ORAL_TABLET | Freq: Every day | ORAL | Status: DC
  Administered 2022-04-19 – 2022-04-20 (×2): 100 mg via ORAL
  Filled 2022-04-19 (×2): qty 1

## 2022-04-19 NOTE — Progress Notes (Cosign Needed Addendum)
Subjective:  The patient reports she feels better today, but is unable to describe in what way. She is unsure of the events leading up to her admission. She denies pain. The patient became tearful during our exam and repeated "I just want things to be okay". She was unable to describe what was wrong and how we can help her. We assured her that she is in a safe place and her lithium levels are improving.   Objective:  Vital signs in last 24 hours: Vitals:   04/18/22 2342 04/19/22 0338 04/19/22 0726 04/19/22 1130  BP: (!) 112/54 105/81 122/80 123/79  Pulse: 71 71 75 75  Resp: '20 20 18 19  '$ Temp: 98.4 F (36.9 C) 98.1 F (36.7 C) 98.9 F (37.2 C) 98.6 F (37 C)  TempSrc: Oral Oral Oral Oral  SpO2: 98% 99% 100% 99%   Weight change:   Intake/Output Summary (Last 24 hours) at 04/19/2022 1358 Last data filed at 04/19/2022 1200 Gross per 24 hour  Intake 4510.7 ml  Output 500 ml  Net 4010.7 ml   General: Sleepy, but arousable; Tearful; No acute distress HEENT: Normocephalic, atraumatic  Pulmonary: Normal respiratory effort on room air Neuro: Alert, not oriented to situation; No focal deficits, no tremor Skin: Warm and dry Psych: Sad mood and affect; Cooperative; Does not always answer questions appropriately   Assessment/Plan:  Principal Problem:   Lithium toxicity Active Problems:   Anxiety   Toxic encephalopathy   Bipolar disorder ([place])   Cocaine use disorder (HCC)   Postoperative hypothyroidism  [name] is a 51 yo F with a PMH of bipolar disorder, complex regional pain syndrome/fibromyalgia, Graves' disease status post thyroidectomy (02/2021), peripheral neuropathy, anxiety, and substance use disorder (cocaine and THC) who presents after worsening confusion and imbalance found to have AKI with elevated lithium level and UDS + for benzodiazepines.    #Toxic encephalopathy #lithium toxicity  Lithium level has improved to 1.23 after fluids, down from 2.73 on  admission. Creatinine is 0.94. The patient still appears disoriented on exam today, which may be due to continued lithium effects vs. adjusting psych medications. She also appears to be in some emotional distress. Fortunately, tremors appear to have resolved today. It is unclear why the patient has been unable to take her medications as prescribed, and how her drug use and mental health conditions play a role. Plan to contact the patient's outpatient psychiatrist to confirm medication regimen. Will also consult inpatient psychiatry for medication management recommendations. Per discussion with psych, it was recommended that she be restarted on '100mg'$  seroquel tonight.  It will be important that patient be discharged on a definitive regimen that she is able to easily follow.  - f/u psychiatry recommendations  - LI level once more this evening, can stop frequent lab checks if wnl.  - seroquel 100 - Clonazepam 0.5 mg BID - Hold Gabapentin  - encourage PO fluid intake. if poor PO and Li level still elevated, will need to restart IVFs  #Hypothyroidism s/p total thyroidectomy 02/2021  Restarted on home meds. - Continue Levothyroxine 50 mcg daily - Continue Cytomel 5 mcg BID  #Reported Bipolar Disorder  Psychiatry has been consulted for medication management. The patient was on Seroquel 200 mg qhs, so will continue at lower dose until psych evaluates.  - Seroquel 100 mg qhs   #CRPS Patient reportedly on gabapentin '300mg'$  qAM and '900mg'$  at night. Will hold this given her mentation.  - Hold Gabapentin   #Anxiety  -  Clonazepam 0.5 mg BID - Will likely need further taper in the outpatient setting    LOS: 1 day   April [name], Medical Student 04/19/2022, 1:58 PM   Attestation for Student Documentation: I personally was present and performed or re-performed the history, physical exam and medical decision-making activities of this service and have verified that the service and findings are  accurately documented in the student's note. April Ruffini, MD 04/19/2022, 3:13 PM

## 2022-04-19 NOTE — Plan of Care (Signed)

## 2022-04-19 NOTE — Evaluation (Signed)
Occupational Therapy Evaluation Patient Details Name: April [name] MRN: [id number] DOB: 1970/06/14 Today's Date: 04/19/2022   History of Present Illness 51 year old person came to the ED 04/18/22 for confusion and admitted with lithium toxicity. PMH bipolar disease, complex regional pain syndrome/fibromyalgia, Graves' disease status post thyroidectomy (02/2021), peripheral neuropathy, anxiety, RLE nerve damage, COPD, and substance use disorder (cocaine and THC   Clinical Impression   Prior to this admission, patient living with her daughter April [name], needing assist to take her medications appropriately, and to complete upper level cognitive tasks. All information received from April [name], (patients other daughter, who is POA). Per April [name]'s report, patient was able to complete ambulation with a RW and self-care tasks when she was living with her, however when she returned to [place], patient began taking her medication incorrectly and required a hospital admission. Per report and April [name], this same scenario has occurred 3x this year, requiring a hospitalization each time. April [name] states that her sister is a drug addict, the house is unsafe and dirty, but the patient is unable to live with April [name] full time due to [place] work schedule and family. Currently, patient presenting with decreased cognition, expressive difficulties, poor motor planning and initiation, and need for increased assist to complete all aspects of care. Patient requiring max A for ADLs and mod-max A of 2 to complete functional mobility. OT recommending SNF level rehab at discharge, and 24/7 assist prior to discharge. OT speaking at length with April [name] at end of session stating she would like to explore long term placement for her mother, as her sister's house is not an ideal or safe living situation. OT reflecting this information to social work at end of session.    Recommendations for follow up therapy are one component of a  multi-disciplinary discharge planning process, led by the attending physician.  Recommendations may be updated based on patient status, additional functional criteria and insurance authorization.   Follow Up Recommendations  Skilled nursing-short term rehab (<3 hours/day)     Assistance Recommended at Discharge Frequent or constant Supervision/Assistance  Patient can return home with the following Two people to help with walking and/or transfers;A lot of help with bathing/dressing/bathroom;Assistance with cooking/housework;Direct supervision/assist for medications management;Direct supervision/assist for financial management;Assist for transportation;Help with stairs or ramp for entrance    Functional Status Assessment  Patient has had a recent decline in their functional status and demonstrates the ability to make significant improvements in function in a reasonable and predictable amount of time.  Equipment Recommendations  Other (comment) (Defer to next venue)    Recommendations for Other Services       Precautions / Restrictions Precautions Precautions: Fall Restrictions Weight Bearing Restrictions: No      Mobility Bed Mobility Overal bed mobility: Needs Assistance Bed Mobility: Supine to Sit, Sit to Supine     Supine to sit: Min assist Sit to supine: Min assist   General bed mobility comments: min A to come into sitting fully, with assistance provided at trunk, and to bring BLEs back into bed    Transfers Overall transfer level: Needs assistance Equipment used: Rolling walker (2 wheels) Transfers: Sit to/from Stand Sit to Stand: Mod assist           General transfer comment: mod-max assist to complete 3 sit<>stands,poor initiation throughout, max extra time and cues needed to complete, tremoulous throughout, unable to sequence steps without tactile cues and initiaiton to complete      Balance Overall balance assessment: Needs assistance Sitting-balance  support: Feet supported, Single extremity supported Sitting balance-Leahy Scale: Fair     Standing balance support: Bilateral upper extremity supported, During functional activity, Reliant on assistive device for balance Standing balance-Leahy Scale: Poor Standing balance comment: reliant on external assit                           ADL either performed or assessed with clinical judgement   ADL Overall ADL's : Needs assistance/impaired Eating/Feeding: Set up;Bed level   Grooming: Minimal assistance;Sitting Grooming Details (indicate cue type and reason): for throughness Upper Body Bathing: Moderate assistance;Sitting   Lower Body Bathing: Moderate assistance;Sitting/lateral leans;Sit to/from stand Lower Body Bathing Details (indicate cue type and reason): for throughness, requiring hand over hand assist to initiaite Upper Body Dressing : Moderate assistance;Sitting Upper Body Dressing Details (indicate cue type and reason): donning gown Lower Body Dressing: Total assistance;Sitting/lateral leans Lower Body Dressing Details (indicate cue type and reason): unable to complete, grabbing for socks on her feet despite not having socks on Toilet Transfer: Moderate assistance;Cueing for safety;Cueing for sequencing;Stand-pivot;Rolling walker (2 wheels) Toilet Transfer Details (indicate cue type and reason): simulated with sit<>stands from chair up to max A to come into standing by third bout Toileting- Clothing Manipulation and Hygiene: Total assistance;Sitting/lateral lean;Sit to/from stand Toileting - Clothing Manipulation Details (indicate cue type and reason): patient soiled upon arrival, was unaware, required total A for peri-care     Functional mobility during ADLs: Maximal assistance;+2 for physical assistance;+2 for safety/equipment;Cueing for safety;Cueing for sequencing;Rolling walker (2 wheels) General ADL Comments: Patient presenting with decreased cognition, poor motor  planning and initiation, and need for increased assist to complete all aspects of care     Vision Baseline Vision/History: 0 No visual deficits Ability to See in Adequate Light: 0 Adequate Patient Visual Report: No change from baseline       Perception     Praxis      Pertinent Vitals/Pain Pain Assessment Pain Assessment: Faces Pain Score: 0-No pain Faces Pain Scale: No hurt     Hand Dominance     Extremity/Trunk Assessment Upper Extremity Assessment Upper Extremity Assessment: Generalized weakness (tremoulous throughout)   Lower Extremity Assessment Lower Extremity Assessment: Defer to PT evaluation;Generalized weakness   Cervical / Trunk Assessment Cervical / Trunk Assessment: Normal   Communication Communication Communication: Receptive difficulties;Expressive difficulties   Cognition Arousal/Alertness: Awake/alert Behavior During Therapy: Impulsive, Restless Overall Cognitive Status: Impaired/Different from baseline Area of Impairment: Orientation, Attention, Memory, Following commands, Safety/judgement, Awareness, Problem solving                 Orientation Level: Place, Time, Situation Current Attention Level: Focused Memory: Decreased short-term memory, Decreased recall of precautions Following Commands: Follows one step commands inconsistently, Follows one step commands with increased time Safety/Judgement: Decreased awareness of safety, Decreased awareness of deficits Awareness: Intellectual Problem Solving: Slow processing, Decreased initiation, Difficulty sequencing, Requires verbal cues, Requires tactile cues General Comments: patient with significant expressive difficulties, requires significant extra time to complete all tasks, very poor initiation     General Comments       Exercises     Shoulder Instructions      Home Living Family/patient expects to be discharged to:: Private residence Living Arrangements: Children (Daughter  April [name]) Available Help at Discharge: Available 24 hours/day;Family (drug addict and not reiable) Type of Home: House Home Access: Stairs to enter [name] of Steps: 2 Entrance Stairs-Rails: None Home Layout: One level  Bathroom Shower/Tub: Teacher, early years/pre: Standard     Home Equipment: Conservation officer, nature (2 wheels);Shower seat   Additional Comments: per April [name] (patients daughter and POA) patient lives with her other daughter April [name], who is a known drug addict, patient is often left to her own devices which is why she over medicates and has had so many admissions to the hospital      Prior Functioning/Environment Prior Level of Function : History of Falls (last six months);Patient poor historian/Family not available             Mobility Comments: could walk with RW but slow and unsteady ADLs Comments: could complete with significant extra time        OT Problem List: Decreased strength;Decreased range of motion;Decreased activity tolerance;Impaired balance (sitting and/or standing);Decreased coordination;Decreased cognition;Decreased safety awareness;Decreased knowledge of use of DME or AE;Decreased knowledge of precautions      OT Treatment/Interventions: Self-care/ADL training;Therapeutic exercise;Neuromuscular education;Energy conservation;DME and/or AE instruction;Manual therapy;Modalities;Therapeutic activities;Cognitive remediation/compensation;Patient/family education;Balance training    OT Goals(Current goals can be found in the care plan section) Acute Rehab OT Goals Patient Stated Goal: unable to state OT Goal Formulation: Patient unable to participate in goal setting Time For Goal Achievement: 05/03/22 Potential to Achieve Goals: Fair  OT Frequency: Min 2X/week    Co-evaluation              AM-PAC OT "6 Clicks" Daily Activity     Outcome Measure Help from another person eating meals?: A Little Help from another person  taking care of personal grooming?: A Little Help from another person toileting, which includes using toliet, bedpan, or urinal?: Total Help from another person bathing (including washing, rinsing, drying)?: A Lot Help from another person to put on and taking off regular upper body clothing?: A Lot Help from another person to put on and taking off regular lower body clothing?: Total 6 Click Score: 12   End of Session Equipment Utilized During Treatment: Gait belt;Rolling walker (2 wheels) Nurse Communication: Mobility status  Activity Tolerance: Patient limited by fatigue Patient left: in bed;with call bell/phone within reach;with nursing/sitter in room;with bed alarm set  OT Visit Diagnosis: Unsteadiness on feet (R26.81);Other abnormalities of gait and mobility (R26.89);Repeated falls (R29.6);Muscle weakness (generalized) (M62.81);History of falling (Z91.81);Cognitive communication deficit (R41.841);Other symptoms and signs involving the nervous system (R29.898)                Time: 0825-0909 OT Time Calculation (min): 44 min Charges:  OT General Charges $OT Visit: 1 Visit OT Evaluation $OT Eval Moderate Complexity: 1 Mod OT Treatments $Self Care/Home Management : 23-37 mins  Corinne Ports E. Annett Boxwell, OTR/L Acute Rehabilitation Services [phone]   Ascencion Dike 04/19/2022, 9:45 AM

## 2022-04-19 NOTE — Evaluation (Signed)
Physical Therapy Evaluation Patient Details Name: April [name] MRN: [id number] DOB: 1970-11-04 Today's Date: 04/19/2022  History of Present Illness  51 year old person came to the ED 04/18/22 for confusion and admitted with lithium toxicity. PMH bipolar disease, complex regional pain syndrome/fibromyalgia, Graves' disease status post thyroidectomy (02/2021), peripheral neuropathy, anxiety, RLE nerve damage, COPD, and substance use disorder (cocaine and THC)  Clinical Impression   Pt admitted secondary to problem above with deficits below. PTA patient was living with her daughter and using a RW to ambulate (but with history of falling). Per OT note, they spoke with April [name] (pt's POA), and patient has been hospitalized multiple times in past year due to similar episode (not taking meds correctly, gets confused).  Pt currently requires max assist for bed mobility, mod assist to come to stand, and only able to safely side-step along bed. Will need +2 to attempt to ambulate due to poor cognition.  Anticipate patient may benefit from PT to address problems listed below, however feel she will need placement in SNF for further rehab. Will continue to follow acutely to maximize functional mobility independence and safety.          Recommendations for follow up therapy are one component of a multi-disciplinary discharge planning process, led by the attending physician.  Recommendations may be updated based on patient status, additional functional criteria and insurance authorization.  Follow Up Recommendations Skilled nursing-short term rehab (<3 hours/day) Can patient physically be transported by private vehicle: No    Assistance Recommended at Discharge Frequent or constant Supervision/Assistance  Patient can return home with the following  Two people to help with walking and/or transfers;Assistance with cooking/housework;Assistance with feeding;Direct supervision/assist for medications  management;Direct supervision/assist for financial management;Assist for transportation;Help with stairs or ramp for entrance    Equipment Recommendations None recommended by PT  Recommendations for Other Services       Functional Status Assessment Patient has had a recent decline in their functional status and demonstrates the ability to make significant improvements in function in a reasonable and predictable amount of time.     Precautions / Restrictions Precautions Precautions: Fall Restrictions Weight Bearing Restrictions: No      Mobility  Bed Mobility Overal bed mobility: Needs Assistance Bed Mobility: Supine to Sit, Sit to Supine     Supine to sit: Max assist Sit to supine: Min assist   General bed mobility comments: pt required assist to move legs over EOB and assist to raise torso to come to sitting and to bring BLEs back into bed    Transfers Overall transfer level: Needs assistance Equipment used: None Transfers: Sit to/from Stand Sit to Stand: Mod assist           General transfer comment: mod with tactile cues/facilitation due to decr cognition    Ambulation/Gait             Pre-gait activities: able to side step toward [facility] with min assist (after mod assist to come to stand); will need +2 to safely attempt ambulation    Stairs            Wheelchair Mobility    Modified Rankin (Stroke Patients Only)       Balance Overall balance assessment: Needs assistance Sitting-balance support: Feet supported, Single extremity supported Sitting balance-Leahy Scale: Fair     Standing balance support: Bilateral upper extremity supported, During functional activity, Reliant on assistive device for balance Standing balance-Leahy Scale: Poor Standing balance comment: reliant on external assit  Pertinent Vitals/Pain Pain Assessment Pain Assessment: Faces Faces Pain Scale: No hurt    Home Living  Family/patient expects to be discharged to:: Private residence Living Arrangements: Children (Daughter April [name]) Available Help at Discharge: Available 24 hours/day;Family (drug addict and not reliable per her sister, April [name]) Type of Home: House Home Access: Stairs to enter Entrance Stairs-Rails: None Entrance Stairs-Number of Steps: 2   Home Layout: One level Home Equipment: Conservation officer, nature (2 wheels);Shower seat Additional Comments: per April [name] (patients daughter and POA) patient lives with her other daughter April [name], who is a known drug addict, patient is often left to her own devices which is why she over medicates and has had so many admissions to the hospital    Prior Function Prior Level of Function : History of Falls (last six months);Patient poor historian/Family not available             Mobility Comments: could walk with RW but slow and unsteady ADLs Comments: could complete with significant extra time     Hand Dominance        Extremity/Trunk Assessment   Upper Extremity Assessment Upper Extremity Assessment: Defer to OT evaluation    Lower Extremity Assessment Lower Extremity Assessment: Generalized weakness;Difficult to assess due to impaired cognition    Cervical / Trunk Assessment Cervical / Trunk Assessment: Normal  Communication   Communication: Receptive difficulties;Expressive difficulties  Cognition Arousal/Alertness: Awake/alert Behavior During Therapy: Flat affect Overall Cognitive Status: Impaired/Different from baseline Area of Impairment: Orientation, Attention, Memory, Following commands, Safety/judgement, Awareness, Problem solving                 Orientation Level: Place, Time, Situation (could only state first name, not last; could not state birthday) Current Attention Level: Focused Memory: Decreased short-term memory, Decreased recall of precautions Following Commands: Follows one step commands inconsistently, Follows one step  commands with increased time Safety/Judgement: Decreased awareness of safety, Decreased awareness of deficits Awareness: Intellectual Problem Solving: Slow processing, Decreased initiation, Difficulty sequencing, Requires verbal cues, Requires tactile cues General Comments: patient with significant expressive difficulties, mostly echos what has just been said to her;  requires significant extra time to complete all tasks, very poor initiation        General Comments      Exercises     Assessment/Plan    PT Assessment Patient needs continued PT services  PT Problem List Decreased strength;Decreased balance;Decreased mobility;Decreased coordination;Decreased cognition;Decreased knowledge of use of DME;Decreased safety awareness;Decreased knowledge of precautions       PT Treatment Interventions DME instruction;Gait training;Functional mobility training;Therapeutic activities;Therapeutic exercise;Balance training;Neuromuscular re-education;Cognitive remediation;Patient/family education    PT Goals (Current goals can be found in the Care Plan section)  Acute Rehab PT Goals Patient Stated Goal: pt unable to state due to decr cognition PT Goal Formulation: Patient unable to participate in goal setting Time For Goal Achievement: 05/03/22 Potential to Achieve Goals: Fair    Frequency Min 2X/week     Co-evaluation               AM-PAC PT "6 Clicks" Mobility  Outcome Measure Help needed turning from your back to your side while in a flat bed without using bedrails?: A Lot Help needed moving from lying on your back to sitting on the side of a flat bed without using bedrails?: A Lot Help needed moving to and from a bed to a chair (including a wheelchair)?: Total Help needed standing up from a chair using your arms (e.g., wheelchair or bedside chair)?: A  Lot Help needed to walk in hospital room?: Total Help needed climbing 3-5 steps with a railing? : Total 6 Click Score: 9     End of Session Equipment Utilized During Treatment: Gait belt Activity Tolerance: Patient tolerated treatment well Patient left: in bed;with call bell/phone within reach;with bed alarm set Nurse Communication: Mobility status PT Visit Diagnosis: Other abnormalities of gait and mobility (R26.89);Repeated falls (R29.6);Muscle weakness (generalized) (M62.81)    Time: 6438-3779 PT Time Calculation (min) (ACUTE ONLY): 16 min   Charges:   PT Evaluation $PT Eval Low Complexity: [place], PT Acute Rehabilitation Services  Office [phone]   Rexanne Mano 04/19/2022, 3:34 PM

## 2022-04-20 DIAGNOSIS — G9340 Encephalopathy, unspecified: Secondary | ICD-10-CM

## 2022-04-20 DIAGNOSIS — R4689 Other symptoms and signs involving appearance and behavior: Secondary | ICD-10-CM | POA: Diagnosis not present

## 2022-04-20 DIAGNOSIS — G929 Unspecified toxic encephalopathy: Secondary | ICD-10-CM | POA: Diagnosis not present

## 2022-04-20 LAB — BASIC METABOLIC PANEL
Anion gap: 9 (ref 5–15)
BUN: 6 mg/dL (ref 6–20)
CO2: 20 mmol/L — ABNORMAL LOW (ref 22–32)
Calcium: 8.6 mg/dL — ABNORMAL LOW (ref 8.9–10.3)
Chloride: 108 mmol/L (ref 98–111)
Creatinine, Ser: 0.76 mg/dL (ref 0.44–1.00)
GFR, Estimated: >60 mL/min (ref >60)
Glucose, Bld: 109 mg/dL — ABNORMAL HIGH (ref 70–99)
Potassium: 3.3 mmol/L — ABNORMAL LOW (ref 3.5–5.1)
Sodium: 137 mmol/L (ref 135–145)

## 2022-04-20 LAB — LITHIUM LEVEL: Lithium Lvl: 0.71 mmol/L (ref 0.60–1.20)

## 2022-04-20 LAB — T3, FREE: T3, Free: 1.3 pg/mL — ABNORMAL LOW (ref 2.0–4.4)

## 2022-04-20 LAB — VITAMIN B1: Vitamin B1 (Thiamine): 106 nmol/L (ref 66.5–200.0)

## 2022-04-20 MED ORDER — POTASSIUM CHLORIDE 20 MEQ PO PACK
40.0000 meq | PACK | Freq: Once | ORAL | Status: AC
  Administered 2022-04-20: 40 meq via ORAL
  Filled 2022-04-20: qty 2

## 2022-04-20 NOTE — [place] FL2 (Signed)
[place] MEDICAID FL2 LEVEL OF CARE SCREENING TOOL     IDENTIFICATION  Patient Name: April [name] Birthdate: 11/26/1970 Sex: female Admission Date (Current Location): 04/17/2022  [facility] and [place] Number:  Herbalist and Address:  [facility], [place] [address], [place], [place] [address]      Provider Number: [id number]  Attending Physician Name and Address:  Angelica Pou, MD  Relative Name and Phone Number:  [name] DZHGD;[phone]    Current Level of Care: Hospital Recommended Level of Care: [place] Prior Approval Number:    Date Approved/Denied:   PASRR Number:  [id number]  Discharge Plan: SNF    Current Diagnoses: Patient Active Problem List   Diagnosis Date Noted   Abnormal behavior 04/19/2022   Toxic encephalopathy 04/18/2022   Lithium toxicity 04/18/2022   Bipolar disorder ([place]) 03/25/2022   Cocaine use disorder ([place]) 03/25/2022   Peripheral neuropathy 03/25/2022   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   Chronic pain 12/10/2014   Neuropathic pain 12/10/2014   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 12/06/2014   Tibia fracture 12/05/2014   Disuse osteoporosis 07/18/2014   Tibial fracture 07/16/2014   Asthma    Anxiety    GERD (gastroesophageal reflux disease)    nonunion tibia, 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    Orientation RESPIRATION BLADDER Height & Weight      (pt is disoriented)  Normal Indwelling catheter, Continent Weight:   Height:     BEHAVIORAL SYMPTOMS/MOOD NEUROLOGICAL BOWEL NUTRITION STATUS      Incontinent Diet (see d/c summary)  AMBULATORY STATUS COMMUNICATION OF NEEDS Skin   Extensive Assist Verbally (delayed speech) Normal                       Personal Care Assistance Level of Assistance  Bathing, Dressing Bathing Assistance: Maximum assistance   Dressing Assistance: Maximum assistance     Functional Limitations Info             SPECIAL CARE  FACTORS FREQUENCY  OT (By licensed OT), PT (By licensed PT)     PT Frequency: 5x/week OT Frequency: 5x/week            Contractures Contractures Info: Not present    Additional Factors Info  Code Status, Allergies Code Status Info: Full Allergies Info: Aspirin           Current Medications (04/20/2022):  This is the current hospital active medication list Current Facility-Administered Medications  Medication Dose Route Frequency Provider Last Rate Last Admin   acetaminophen (TYLENOL) tablet 650 mg  650 mg Oral Q6H PRN Rick Duff, MD   650 mg at 04/18/22 2228   Or   acetaminophen (TYLENOL) suppository 650 mg  650 mg Rectal Q6H PRN Rick Duff, MD       clonazePAM [facility] [place]) tablet 0.5 mg  0.5 mg Oral BID Rick Duff, MD   0.5 mg at 11/94/17 4081   folic acid (FOLVITE) tablet 1 mg  1 mg Oral Daily Rick Duff, MD   1 mg at 04/20/22 4481   levothyroxine (SYNTHROID) tablet 50 mcg  50 mcg Oral QAC breakfast Delene Ruffini, MD   50 mcg at 04/20/22 8563   liothyronine (CYTOMEL) tablet 5 mcg  5 mcg Oral BID Delene Ruffini, MD   5 mcg at 04/20/22 1497   multivitamin with minerals tablet 1 tablet  1 tablet Oral Daily Rick Duff, MD   1 tablet at 04/20/22 [phone]  ondansetron (ZOFRAN) tablet 4 mg  4 mg Oral Q6H PRN Rick Duff, MD       Or   ondansetron [facility]) injection 4 mg  4 mg Intravenous Q6H PRN Rick Duff, MD       QUEtiapine (SEROQUEL) tablet 100 mg  100 mg Oral QHS Delene Ruffini, MD   100 mg at 04/19/22 2120   rivaroxaban (XARELTO) tablet 10 mg  10 mg Oral Daily Rick Duff, MD   10 mg at 04/20/22 0097   thiamine (VITAMIN B1) tablet 100 mg  100 mg Oral Daily Rick Duff, MD   100 mg at 04/20/22 9499     Discharge Medications: Please see discharge summary for a list of discharge medications.  Relevant Imaging Results:  Relevant Lab Results:   Additional Information  SS#: [id number]  Jinger Neighbors,  LCSW

## 2022-04-20 NOTE — Plan of Care (Signed)
  Problem: Education: Goal: Knowledge of General Education information will improve Description: Including pain rating scale, medication(s)/side effects and non-pharmacologic comfort measures Outcome: Not Progressing   Problem: Health Behavior/Discharge Planning: Goal: Ability to manage health-related needs will improve Outcome: Not Progressing   

## 2022-04-20 NOTE — Progress Notes (Cosign Needed Addendum)
Subjective:  The patient is intermittently tearful and is unable to finish her thoughts. When asked what is bothering her, she began "Every time I get up..." and trailed off. She denied being afraid or someone/something or being hurt by someone. Denies pain. We updated her that her lithium levels are now within normal limits and psychiatry will come to see her.   Objective:  Vital signs in last 24 hours: Vitals:   04/19/22 1952 04/19/22 2351 04/20/22 0402 04/20/22 0820  BP: (!) 142/90 136/82 133/79 (!) 142/86  Pulse: 81 84 79 81  Resp: '17 12 15 20  '$ Temp: 98.2 F (36.8 C) 97.9 F (36.6 C) 98 F (36.7 C) 98.5 F (36.9 C)  TempSrc: Oral Axillary Axillary Oral  SpO2: 99% 99% 99% 100%   Weight change:   Intake/Output Summary (Last 24 hours) at 04/20/2022 1155 Last data filed at 04/20/2022 1153 Gross per 24 hour  Intake 1190 ml  Output 2252 ml  Net -1062 ml   General: Tearful and tremulous, mild emotional distress; No acute distress Pulmonary: Normal respiratory effort Skin: Large old bruise to left shin and left upper extremity; old bruising to forehead; Warm and dry Neuro: No tremor, no clonus; No focal deficits Psych: Alert, oriented x1: she can state her first name, but was unable to state her last name, where she was, or why she came in; "okay" mood, depressed/sad affect; poor eye contact; intermittently tearful; tremulous; unable to finish thoughts/sentences    Assessment/Plan:  Principal Problem:   Lithium toxicity Active Problems:   Anxiety   Toxic encephalopathy   Bipolar disorder (HCC)   Cocaine use disorder ([place])   Postoperative hypothyroidism   Abnormal behavior   [name] is a 51 yo F with a PMH of bipolar disorder, complex regional pain syndrome/fibromyalgia, Graves' disease status post thyroidectomy (02/2021), peripheral neuropathy, anxiety, and substance use disorder (cocaine and THC) who presents after worsening confusion and imbalance found  to have AKI with elevated lithium level and UDS + for benzodiazepines.   #Toxic encephalopathy #Lithium toxicity  Lithium level has normalized. She no longer has a tremor or clonus on exam, though she still remains disoriented and in emotional distress. She has been unable to verbalize her concerns thus far. The patient's outpatient psychiatrist Gaetano Net NP [phone])  has confirmed that she is diagnosed with bipolar disorder type 2, PTSD, and panic disorder. She is prescribed 3 tablets of Lithium 300 mg qhs, Clonazepam 1 mg twice daily PRN for anxiety, Gabapentin 300 mg in the morning, 300 mg at noon, and 2 tablets of 300 mg at night for anxiety, 1-2 tablets of Seroquel 200 mg qhs, Lunesta 3 mg qhs PRN. He is concerned about possible suicide attempt. She has no history of suicide attempts in the past. Per inpatient psych team, it is not uncommon to reach these levels through a few additional pills (absent intentional overdose) or in the setting of AKI. Ongoing encephalopathy is likely due to hypothyroidism and lithium toxicity, since CNS levels may lag behind serum levels for additional day or two. Not a good candidate for lithium outpatient. Current regimen appropriate. Psychiatry will follow-up with her outpatient provider for further information. Will continue to assess mental status and work with inpatient and outpatient psychiatry to determine a better outpatient regimen.  PT/OT recommend SNF for dispo because the patient requires maximum assistance for ADLs. Also, according to her daughter and power of attorney April [name], the patient's living situation with her daughter April [name] is  unsafe. CSW plans to follow-up with April [name] for dispo planning.  - f/u psychiatry recommendations  - Discontinue LI level checks  - Seroquel 100 qhs - Clonazepam 0.5 mg BID - Hold Gabapentin  - Encourage PO fluid intake  #Hypothyroidism s/p total thyroidectomy 02/2021  Restarted on home meds. She has had  chronically elevated lithium levels which may also be impacting her thyroid treatment.  - Continue Levothyroxine 50 mcg daily - Continue Cytomel 5 mcg BID   #Reported Bipolar Disorder  Will continue on Seroquel 100 mg qhs and work with our psychiatry colleagues to establish a better outpatient regimen, likely discontinuing lithium.  - Seroquel 100 mg qhs    #CRPS Patient prescribed gabapentin '300mg'$  qAM, 300 at noon, and 600 mg at night. Will hold this given her mentation.  - Hold Gabapentin    #Anxiety  - Clonazepam 0.5 mg BID - Will likely need further taper in the outpatient setting   LOS: 2 days   Paulo Fruit, Medical Student 04/20/2022, 11:55 AM   Attestation for Student Documentation: I personally was present and performed or re-performed the history, physical exam and medical decision-making activities of this service and have verified that the service and findings are accurately documented in the student's note. Delene Ruffini, MD 04/20/2022, 5:57 PM

## 2022-04-20 NOTE — Plan of Care (Signed)
  Problem: Education: Goal: Knowledge of General Education information will improve Description: Including pain rating scale, medication(s)/side effects and non-pharmacologic comfort measures 04/20/2022 1618 by Santo Held, RN Outcome: Not Progressing 04/20/2022 1210 by Santo Held, RN Outcome: Not Progressing   Problem: Health Behavior/Discharge Planning: Goal: Ability to manage health-related needs will improve 04/20/2022 1618 by Santo Held, RN Outcome: Not Progressing 04/20/2022 1210 by Santo Held, RN Outcome: Not Progressing

## 2022-04-20 NOTE — TOC Initial Note (Addendum)
Transition of Care [facility]) - Initial/Assessment Note    Patient Details  Name: April [name] MRN: [id number] Date of Birth: 10-30-1970  [facility]) CM/SW Contact:    Jinger Neighbors, LCSW Phone Number: 04/20/2022, 11:19 AM  Clinical Narrative:                 CSW met face to face with pt. Pt is alert and oriented times one, minus time and place. She was able to state her first name, but not her last, nor was she able to recall her DOB. Patient has delayed speech and unable to verbalize thoughts, which is very frustrating for her. CSW asked if she was frustrated and she reported, "yes". CSW explained recommendations for SNF, discussing barriers due to insurance. She did say "yes" when asked closed ended questions regarding SNF. CSW will f/u with daughter, POA about after care plans.  CSW conversed with daughter to discuss plan for d/c. Daughter is on board with SNF placement for rehab. Work up is complete.   Expected Discharge Plan: Skilled Nursing Facility Barriers to Discharge: SNF Pending transportation, SNF Pending bed offer, SNF Pending discharge orders, Inadequate or no insurance   Patient Goals and CMS Choice   CMS Medicare.gov Compare Post Acute Care list provided to:: Patient Choice offered to / list presented to : Patient  Expected Discharge Plan and Services Expected Discharge Plan: [place] Choice: [place] arrangements for the past 2 months: Single Family Home                                      Prior Living Arrangements/Services Living arrangements for the past 2 months: Single Family Home Lives with:: Adult Children Patient language and need for interpreter reviewed:: Yes Do you feel safe going back to the place where you live?: Yes      Need for Family Participation in Patient Care: Yes (Comment) Care giver support system in place?: Yes (comment)   Criminal Activity/Legal Involvement  Pertinent to Current Situation/Hospitalization: No - Comment as needed  Activities of Daily Living      Permission Sought/Granted Permission sought to share information with : Family Supports, Case Manager Permission granted to share information with : Yes, Verbal Permission Granted  Share Information with NAME: [name]     Permission granted to share info w Relationship: Daughter  Permission granted to share info w Contact Information: [name] [phone]  Emotional Assessment Appearance:: Appears stated age Attitude/Demeanor/Rapport: Apprehensive Affect (typically observed): Guarded Orientation: : Oriented to Self (pt unable to state last name, dob, place or time) Alcohol / Substance Use: Other (comment) (pt reports using cocaine, but later denied using cocaine recently. 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

## 2022-04-20 NOTE — Consult Note (Signed)
[facility] New Face-to-Face Psychiatric Evaluation   Service Date: April 20, 2022 LOS:  LOS: 2 days    Assessment  April [name] is a 51 year old female with a history of bipolar affective disorder, cocaine use disorder, and chronic pain.  No previous psychiatric hospitalizations can be found in the medical record.  Her past medical history is notable for Graves' disease s/p thyroidectomy.  She was medically admitted on 11/25 for encephalopathy thought to be due to lithium toxicity, with hyperthyroidism as another consideration.  Psychiatry consult was requested for medication assistance.  On assessment 11/28 the patient is still encephalopathic, with inattention, speech latency, trouble following complex commands. Low suspicion for catatonia or serotonin syndrome based on our exam. (No ocular clonus or clonus of the extremities, no hyperreflexia noted: no grasp reflex, mutism, immobility, or stereotypy/mannerisms).  Suspect continued toxic encephalopathy due to lithium toxicity which only recently resolved (lithium level has been normal for less than 24 hours at this point).  Suspect hypothyroidism is also contributing.  Management per primary.  Will also continue to consider catatonia.  Her psychotropic medication has been restarted as below and is appropriate for the moment.  With regard to safety, the patient has no prior history of psychiatric hospitalization or suicide attempts or even making suicidal statements according to her daughter, April [name].  Despite this, it is concerning that she took enough lithium to have a level above 2.  We will continue to assess the patient and determine if there was any intention to harm herself with the overdose.  Regarding outpatient follow-up, it is clear that the patient should no longer be taking lithium, given that she has been found to have supratherapeutic levels during 2 hospitalizations related to altered mental status.  Plan to call  and discussed this with her outpatient psychiatric provider April [name], [phone].   Diagnoses:  Active Hospital problems: Principal Problem:   Lithium toxicity Active Problems:   Anxiety   Toxic encephalopathy   Bipolar disorder (HCC)   Cocaine use disorder (HCC)   Postoperative hypothyroidism   Abnormal behavior     Plan  ## Safety and Observation Level:  - Based on my clinical evaluation, I estimate the patient to be at low risk of self harm in the current setting - At this time, we recommend a routine level of observation. This decision is based on my review of the chart including patient's history and current presentation, interview of the patient, mental status examination, and consideration of suicide risk including evaluating suicidal ideation, plan, intent, suicidal or self-harm behaviors, risk factors, and protective factors. This judgment is based on our ability to directly address suicide risk, implement suicide prevention strategies and develop a safety plan while the patient is in the clinical setting. Please contact our team if there is a concern that risk level has changed.   ## Medications:  -- Continue home Seroquel at reduced dose of 100 mg QHS - Continue to hold home Risperdal - Continue to hold home lithium - Continue home Klonopin at a reduced dose of 0.5 mg twice daily  ## Medical Decision Making Capacity:  Not assessed on this encounter  ## Further Work-up:  Per primary  ## Disposition:  TBD  ## Behavioral / Environmental:  --Routine obs  ##Legal Status VOL  Thank you for this consult request. Recommendations have been communicated to the primary team.  We will follow at this time.   April Sox, MD   NEW history  Relevant  Aspects of Hospital Course:  Admitted on 04/17/2022 for AMS.  Patient Report:   Pt seen in mid-AM. Notable latency of responses. She is unable to answer where she is from or where she is currently living. She  appears to be in some distress, but is unable to name why she is upset. She is able to name that we are in a hospital with multiple choice options. She is unable to name what city the hospital is in. She states her name is April [name] - states that is not her legal name - needed to have April [name] (pronounced Rayle). supplied (could not name). Later stated that she does not go my April [name] and goes by April [name].   Mental status exam is notable for a patient who is relatively alert but exhibits severe speech latency and is unable to follow complex commands or engage in tasks requiring minimal amounts of attention.  Physical exam is as documented below.    States "I'm cold". She does not know when her last bowel movement was. She has difficulty stating whether or not she is in pain.   Tremor has returned.   ROS:  As above  Collateral information:  April [name], Daughter Level of contact: few times per week via phone, she lives in [place], hard to visit; sister April [name] is a drug addict and steals things.  Daughter: April [name], converse but confused  Functional September - took care of April [name] after her gallbladder surgery  Going on for two months: hall, del, no strength D/t not taking meds, taking other meds, permanent damage due to drugs I know she's not taking her meds, abusing lithium and seroquel   Manic episodes - yes, unsure if in context of drugs Drugs - cocaine, a lot - nose damage found recently Psych hosp: no SI: yes, about living with sis, OD on cocaine SA: no Patient is tired of living with April [name], she's stealing money, but the patient has no financial means, cannot live with April [name] b/c she has a son Took 3-4 days to improve at [place], only normal at day 6  Psychiatric History:  Information collected from patient, EMR  Family psych history: none   Social History:  As above   Family History:  The patient's family history includes Alcoholism in her father; Heart disease in  her father; Lung cancer in her mother.  Medical History: Past Medical History:  Diagnosis Date   ADHD (attention deficit hyperactivity disorder)    Anemia    years ago   Anxiety    Arthritis    Asthma    Cancer ([place])    uterine cancer, hysterectomy - ?2005   Chronic pain 2/94/7654   Complication of anesthesia    woke up with an anxiety attack with last surgery (2013), pt. reports that she has high tolerance to medicine, also has woken during anesth.    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   Fibromyalgia    GERD (gastroesophageal reflux disease)    years ago   Headache    migraines    History of hiatal hernia    years ago   Irritable bowel syndrome    Neuromuscular disorder ([place])    nerve damage in right leg   Neuropathic pain 12/10/2014   Pneumonia 2012  Shortness of breath dyspnea     Surgical History: Past Surgical History:  Procedure Laterality Date   BUNIONECTOMY Bilateral    COLONOSCOPY     DILATION AND CURETTAGE OF UTERUS     ESOPHAGOGASTRODUODENOSCOPY     FRACTURE SURGERY     right tibia   HARDWARE REMOVAL Right 04/02/2014   Procedure: AND HARDWARE REMOVAL ;  Surgeon: Rozanna Box, MD;  Location: [facility];  Service: Orthopedics;  Laterality: Right;   HARDWARE REMOVAL Right 07/16/2014   Procedure: HARDWARE REMOVAL;  Surgeon: Rozanna Box, MD;  Location: [facility];  Service: Orthopedics;  Laterality: Right;   HARDWARE REMOVAL Right 12/05/2014   Procedure: HARDWARE REMOVAL RIGHT TIBIAL;  Surgeon: Altamese Belmore, MD;  Location: [facility];  Service: Orthopedics;  Laterality: Right;   HARDWARE REMOVAL Right 03/31/2017   Procedure: HARDWARE REMOVAL RIGHT TIBIA (SCREWS ONLY);  Surgeon: Altamese [place], MD;  Location: [place];  Service: Orthopedics;  Laterality: Right;    HARVEST BONE GRAFT Right 04/02/2014   Procedure:  AND ILIAC BONE GRAFT;  Surgeon: Rozanna Box, MD;  Location: [facility];  Service: Orthopedics;  Laterality: Right;   ORIF TIBIA FRACTURE Right 04/02/2014   Procedure: OPEN REDUCTION INTERNAL FIXATION (ORIF) RIGHT DISTAL TIBIA FRACTURE;  Surgeon: Rozanna Box, MD;  Location: [facility];  Service: Orthopedics;  Laterality: Right;   TIBIA IM NAIL INSERTION Right 12/05/2014   Procedure: INTRAMEDULLARY (IM) NAIL RIGHT  TIBIAL;  Surgeon: Altamese [place], MD;  Location: [place];  Service: Orthopedics;  Laterality: Right;   TIBIA OSTEOTOMY Right 07/16/2014   Procedure: RIGHT TIBIAL OSTEOTOMY;  Surgeon: Rozanna Box, MD;  Location: [facility];  Service: Orthopedics;  Laterality: Right;   TONSILLECTOMY     TUBAL LIGATION     VAGINAL HYSTERECTOMY  2000   WISDOM TOOTH EXTRACTION      Medications:   Current Facility-Administered Medications:    acetaminophen (TYLENOL) tablet 650 mg, 650 mg, Oral, Q6H PRN, 650 mg at 04/18/22 2228 **OR** acetaminophen (TYLENOL) suppository 650 mg, 650 mg, Rectal, Q6H PRN, Rick Duff, MD   clonazePAM Bobbye [place]) tablet 0.5 mg, 0.5 mg, Oral, BID, Rick Duff, MD, 0.5 mg at 68/03/21 2248   folic acid (FOLVITE) tablet 1 mg, 1 mg, Oral, Daily, Rick Duff, MD, 1 mg at 04/20/22 2500   levothyroxine (SYNTHROID) tablet 50 mcg, 50 mcg, Oral, QAC breakfast, Delene Ruffini, MD, 50 mcg at 04/20/22 3704   liothyronine (CYTOMEL) tablet 5 mcg, 5 mcg, Oral, BID, Delene Ruffini, MD, 5 mcg at 04/20/22 8889   multivitamin with minerals tablet 1 tablet, 1 tablet, Oral, Daily, Rick Duff, MD, 1 tablet at 04/20/22 0814   ondansetron (ZOFRAN) tablet 4 mg, 4 mg, Oral, Q6H PRN **OR** ondansetron (ZOFRAN) injection 4 mg, 4 mg, Intravenous, Q6H PRN, Rick Duff, MD   QUEtiapine (SEROQUEL) tablet 100 mg, 100 mg, Oral, QHS, [place], Rachell Cipro, MD, 100 mg at 04/19/22 2120   rivaroxaban (XARELTO) tablet 10 mg, 10 mg, Oral, Daily,  Rick Duff, MD, 10 mg at 04/20/22 1694   thiamine (VITAMIN B1) tablet 100 mg, 100 mg, Oral, Daily, Rick Duff, MD, 100 mg at 04/20/22 5038  Allergies: Allergies  Allergen Reactions   Asa [Aspirin] Other (See Comments)    Stomach irritation/upset.       Objective  Vital signs:  Temp:  [97.9 F (36.6 C)-98.6 F (37 C)] 98.5 F (36.9 C) (11/28 0820) Pulse Rate:  [76-84] 81 (11/28 0820) Resp:  [12-20] 20 (11/28 0820) BP: (123-142)/(79-90) 142/86 (  11/28 0820) SpO2:  [98 %-100 %] 100 % (11/28 0820)  Psychiatric Specialty Exam: Physical Exam Constitutional:      Appearance: the patient is not toxic-appearing.  Pulmonary:     Effort: Pulmonary effort is normal.  Neurological:     Mental status as above    No ocular or extremity clonus, no patellar hyperreflexia    No palmar reflex, no mannerisms/stereotypy   Review of Systems  Respiratory:  Negative for shortness of breath.   Cardiovascular:  Negative for chest pain.  Gastrointestinal:  Negative for abdominal pain, constipation, diarrhea, nausea and vomiting.  Neurological:  Negative for headaches.      BP (!) 142/86 (BP Location: Right Arm)   Pulse 81   Temp 98.5 F (36.9 C) (Oral)   Resp 20   SpO2 100%   General Appearance: Fairly Groomed  Eye Contact:  fair  Speech:  garbled  Volume:  Normal  Mood:  unable to assess  Affect:  bizarre   Thought Process:  illogical  Orientation:  Full (Time, Place, and Person)  Thought Content: unable to assess  Suicidal Thoughts:  unable to assess  Homicidal Thoughts:  unable to assess  Memory:  unable to assess  Judgement: unable to assess  Insight:  unable to assess  Psychomotor Activity:  reduced  Concentration:  unable to assess  Recall:  unable to assess  Fund of Knowledge: unable to assess  Language: poor  Akathisia:  NA  Handed:    AIMS (if indicated): not done  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

## 2022-04-21 DIAGNOSIS — R4689 Other symptoms and signs involving appearance and behavior: Secondary | ICD-10-CM | POA: Diagnosis not present

## 2022-04-21 DIAGNOSIS — G929 Unspecified toxic encephalopathy: Secondary | ICD-10-CM | POA: Diagnosis not present

## 2022-04-21 LAB — BASIC METABOLIC PANEL
Anion gap: 6 (ref 5–15)
BUN: <5 mg/dL — ABNORMAL LOW (ref 6–20)
CO2: 21 mmol/L — ABNORMAL LOW (ref 22–32)
Calcium: 8.6 mg/dL — ABNORMAL LOW (ref 8.9–10.3)
Chloride: 106 mmol/L (ref 98–111)
Creatinine, Ser: 0.68 mg/dL (ref 0.44–1.00)
GFR, Estimated: >60 mL/min (ref >60)
Glucose, Bld: 108 mg/dL — ABNORMAL HIGH (ref 70–99)
Potassium: 3.5 mmol/L (ref 3.5–5.1)
Sodium: 133 mmol/L — ABNORMAL LOW (ref 135–145)

## 2022-04-21 LAB — PHOSPHORUS: Phosphorus: 1.7 mg/dL — ABNORMAL LOW (ref 2.5–4.6)

## 2022-04-21 LAB — MAGNESIUM: Magnesium: 2 mg/dL (ref 1.7–2.4)

## 2022-04-21 MED ORDER — DICLOFENAC SODIUM 1 % EX GEL
2.0000 g | Freq: Two times a day (BID) | CUTANEOUS | Status: DC | PRN
  Administered 2022-04-24: 2 g via TOPICAL
  Filled 2022-04-21 (×3): qty 100

## 2022-04-21 MED ORDER — POTASSIUM PHOSPHATES 15 MMOLE/5ML IV SOLN
30.0000 mmol | Freq: Once | INTRAVENOUS | Status: AC
  Administered 2022-04-21: 30 mmol via INTRAVENOUS
  Filled 2022-04-21: qty 10

## 2022-04-21 MED ORDER — CLONAZEPAM 0.5 MG PO TABS
0.5000 mg | ORAL_TABLET | Freq: Two times a day (BID) | ORAL | Status: DC
  Administered 2022-04-21 – 2022-04-23 (×4): 0.5 mg via ORAL
  Filled 2022-04-21 (×4): qty 1

## 2022-04-21 MED ORDER — LACTATED RINGERS IV BOLUS
1000.0000 mL | Freq: Once | INTRAVENOUS | Status: AC
  Administered 2022-04-21: 1000 mL via INTRAVENOUS

## 2022-04-21 NOTE — Progress Notes (Addendum)
Subjective:  The patient reports today she feels better, because she understands what is going on more today. She reports some pain in her right wrist, and reports she injured it due to a recent fall. I updated the patient that she received a right wrist XR on admission that was negative for fracture or dislocation. When asked about her mood, she responded "sad" and became tearful. She reports she has not been eating much, and notes low appetite. She agreed to try to eat more today to maintain her nutrition.   Daughter April [name] at bedside. She believes the patient appears better today than 2 days ago when she last visited. She reports her mom is now less mean and agitated, and no longer hallucinating as she was on presentation. April [name] has been able to speak with the patient, and though her responses are still slower, her responses are appropriate and on topic.   She reports that the patient was fully conversational and mobile 4 months ago before she started taking Lithium. Since then, her mother is a different person who cannot take care of herself. The patient has been admitted to the hospital with altered mental status before due to elevated Lithium levels, and at discharge, she was unable to bathe herself and take care of herself. A few days later the patient began to improve to the point that she was able to slowly dress herself and take care of herself, but once she returned home with her other daughter April [name], the patient began to regress again. April [name] reports the patient's daughter April [name] lives with the patient in a home where neither of them pay rent, describing them as "squatters". She does not believe this is a safe living situation for her mother, and describes April [name] as a "thief and drug addict". April [name] is concerned that even though the patient improves in the hospital and under her care, she will regress again when left at home with her other daughter. April [name] is open to housing the patient for  a short term stay, days to weeks, but her and her spouse have agreed not to let the patient stay long term. She is interested in SNF care if appropriate.   Objective:  Vital signs in last 24 hours: Vitals:   04/20/22 1619 04/20/22 2337 04/21/22 0347 04/21/22 0850  BP: 125/77 123/76 120/77 136/83  Pulse: 82 82 74 75  Resp: '18 16 17 18  '$ Temp: 98.3 F (36.8 C) (!) 97.4 F (36.3 C) (!) 97.5 F (36.4 C) 98 F (36.7 C)  TempSrc: Oral Oral Oral Oral  SpO2: 100% 100% 100% 100%   Weight change:   Intake/Output Summary (Last 24 hours) at 04/21/2022 1302 Last data filed at 04/21/2022 0800 Gross per 24 hour  Intake 480 ml  Output 600 ml  Net -120 ml   General: No acute distress Cardiovascular: RRR, no murmurs, rubs, or gallops; 2+ radial and dp pulses Pulmonary: Normal respiratory effort; Lungs clear to auscultation anteriorly; no wheezes, rales, or rhonchi MSK: Mild right wrist swelling and tenderness, no erythema or warmth, full range of motion  Skin: Dry, flaky skin around mouth and face; decreased skin turgor  Neuro: Alert and oriented x2, can state her full name, her location, and the year; cannot state why she is in the hospital, the date, or month; CN 2-12 grossly intact; 5/5 strength in upper extremities bilaterally; intact sensation; follows commands appropriately; no tremor Psych: "Sad" mood (becomes tearful when asked about her mood), depressed/sad affect; Responds to questions  appropriately; slow speech; good eye contact   Assessment/Plan:  Principal Problem:   Lithium toxicity Active Problems:   Anxiety   Toxic encephalopathy   Bipolar disorder (HCC)   Cocaine use disorder (HCC)   Postoperative hypothyroidism   Abnormal behavior   Encephalopathy  [name] is a 51 yo F with a PMH of bipolar disorder, complex regional pain syndrome/fibromyalgia, Graves' disease status post thyroidectomy (02/2021), peripheral neuropathy, anxiety, and substance use disorder  (cocaine and THC) who presents after worsening confusion and imbalance found to have AKI with elevated lithium level and UDS + for benzodiazepines.    #Toxic encephalopathy #Lithium toxicity  The patient is much more oriented and conversational today. As psychiatry noted, the patient CSF lithium levels likely lagged a bit behind her serum levels, and she will continue to improve. Remaining deficits likely due to previously untreated hypothyroidism which should improve now that she is on her medications again. Psychiatry has assessed the patient today and would like to continue half home dose of Klonopin and discontinue Seroquel. Outpatient lithium levels have not been monitored. Diagnosis of bipolar disorder does not seem likely.  Will continue to monitor mental status and determine a safe disposition with the patient and her daughter April [name]. Dispo pending continued improvement is SNF vs home with daughter.  - f/u psychiatry recommendations  - Discontinue Seroquel 100 qhs - Clonazepam 0.5 mg BID - Hold Gabapentin  - Encourage PO fluid intake  #Hypothyroidism s/p total thyroidectomy 02/2021   Restarted on home meds. Daughter was concerned about thyroid hormone values. Discussed with daughter that will likely take several days for TSH and T4 levels to increase to normal levels. Her improved mentation is reassuring that both lithium toxicity and hypothyroidism are improving.  - Continue Levothyroxine 50 mcg daily - Continue Cytomel 5 mcg BID  #Hyponatremia #Hypophosphatemia  The patient has low sodium today of 133, and low phosphorus of 1.7. She reports low appetite and has not been eating or drinking much, and has decreased skin turgor on exam, so this is likely hypovolemic hyponatremia. Will replete phosphorus and start fluids, and encourage PO intake.  - IV potassium phosphate 30 mmol  - LR 1 L bolus  - Encourage PO  #Right wrist pain The patient reports a recent fall resulting in right  wrist pain. XR on admission was negative for fracture or dislocation. Only mild swelling on exam. Contacted the patient's nurse to offer ice and Tylenol. Will also order voltaren gel to use as needed. - symptomatic treatment with ice, Tylenol, voltaren gel PRN  #Bipolar 2 Disorder   Inpatient psych team believes this is a misdiagnosis.  - stop seroquel  #CRPS  - Hold Gabapentin    #Anxiety  - Clonazepam 0.5 mg BID     LOS: 3 days   Paulo Fruit, Medical Student 04/21/2022, 1:02 PM   Attestation for Student Documentation: I personally was present and performed or re-performed the history, physical exam and medical decision-making activities of this service and have verified that the service and findings are accurately documented in the student's note. Delene Ruffini, MD 04/21/2022, 4:43 PM

## 2022-04-21 NOTE — TOC Progression Note (Signed)
Transition of Care [facility]) - Progression Note    Patient Details  Name: April [name] MRN: [id number] Date of Birth: 06/12/1970  Transition of Care [facility]) CM/SW Contact  Jinger Neighbors, [place] Phone Number: 04/21/2022, 10:22 AM  Clinical Narrative:    CSW reviewed placements; called Whitney at [facility]), as this facility is "considering". Whitney reports the facility is doing a bed side visit and will make a decision after. CSW met face to face with pt and her daughter, [name]. CSW explained conversation with Whitney. [name] reports pt's psychiatrist is aware of pt's current medical status and plans to meet with pt 04/22/2022 to assess. CSW provided pt's daughter the information to [facility] for [employer] services for assistance with [facility] and MH needs.    Expected Discharge Plan: [place] Barriers to Discharge: SNF Pending transportation, SNF Pending bed offer, SNF Pending discharge orders, Inadequate or no insurance  Expected Discharge Plan and Services Expected Discharge Plan: [place] Choice: [place] arrangements for the past 2 months: Single Family Home                                       Social Determinants of Health (SDOH) Interventions    Readmission Risk Interventions     No data to display

## 2022-04-21 NOTE — Plan of Care (Signed)
  Problem: Education: Goal: Knowledge of General Education information will improve Description: Including pain rating scale, medication(s)/side effects and non-pharmacologic comfort measures Outcome: Not Progressing   Problem: Health Behavior/Discharge Planning: Goal: Ability to manage health-related needs will improve Outcome: Not Progressing   

## 2022-04-21 NOTE — Consult Note (Addendum)
[facility] [facility] Followup Face-to-Face Psychiatric Evaluation   Service Date: April 21, 2022 LOS:  LOS: 3 days    Assessment  April [name] is a 51 year old female with a history of bipolar affective disorder, cocaine use disorder, and chronic pain.  No previous psychiatric hospitalizations can be found in the medical record.  Her past medical history is notable for Graves' disease s/p thyroidectomy.  She was medically admitted on 11/25 for encephalopathy thought to be due to lithium toxicity, with hyperthyroidism as another consideration.  Psychiatry consult was requested for medication assistance.  On assessment 11/28 the patient is still encephalopathic, with inattention, speech latency, trouble following complex commands. Low suspicion for catatonia or serotonin syndrome based on our exam. (No ocular clonus or clonus of the extremities, no hyperreflexia noted: no grasp reflex, mutism, immobility, or stereotypy/mannerisms).  Suspect continued toxic encephalopathy due to lithium toxicity which only recently resolved (lithium level has been normal for less than 24 hours at this point).  Suspect hypothyroidism is also contributing.  Management per primary.  Will also continue to consider catatonia.  Her psychotropic medication has been restarted as below and is appropriate for the moment.  With regard to safety, the patient has no prior history of psychiatric hospitalization or suicide attempts or even making suicidal statements according to her daughter, April [name].  Despite this, it is concerning that she took enough lithium to have a level above 2.  We will continue to assess the patient and determine if there was any intention to harm herself with the excessive ingestion.  Regarding outpatient follow-up, it is clear that the patient should no longer be taking lithium, given that she has been found to have supratherapeutic levels during 2 hospitalizations related to altered mental  status.  Plan to call and discussed this with her outpatient psychiatric provider April [name], [phone].   11/29 Patient with moderately improved mentation today.  Alert and somewhat oriented.  Tangential responses with long delays but is attempting to converse.  Daughter at bedside provides information regarding the patient's previous substance use and abuse of prescription drugs.  Called patient's outpatient provider at the number above.  He agrees that the patient should not be prescribed lithium or Seroquel.  He states the patient has not had any outpatient lithium levels despite her being on medication for 3 months.  Will continue to assess concern for potential suicide attempt once the patient's mentation is more appropriate. Will also consider new medication regimen once we can further assess patient's psychiatric symptoms. Will attempt to find another provider for the patient to follow up with upon D/C.  Diagnoses:  Active Hospital problems: Principal Problem:   Lithium toxicity Active Problems:   Anxiety   Toxic encephalopathy   Bipolar disorder (HCC)   Cocaine use disorder (HCC)   Postoperative hypothyroidism   Abnormal behavior   Encephalopathy     Plan  ## Safety and Observation Level:  - Based on my clinical evaluation, I estimate the patient to be at low risk of self harm in the current setting - At this time, we recommend a routine level of observation. This decision is based on my review of the chart including patient's history and current presentation, interview of the patient, mental status examination, and consideration of suicide risk including evaluating suicidal ideation, plan, intent, suicidal or self-harm behaviors, risk factors, and protective factors. This judgment is based on our ability to directly address suicide risk, implement suicide prevention strategies and develop a safety  plan while the patient is in the clinical setting. Please contact our team if  there is a concern that risk level has changed.   ## Medications:  -- Discontinue home Seroquel - Continue to hold home Risperdal - Continue to hold home lithium - Continue home Klonopin at a reduced dose of 0.5 mg twice daily  ## Medical Decision Making Capacity:  Not assessed on this encounter  ## Further Work-up:  Per primary  ## Disposition:  TBD  ## Behavioral / Environmental:  --Routine obs  ##Legal Status VOL  Thank you for this consult request. Recommendations have been communicated to the primary team.  We will follow at this time.   Corky Sox, MD   NEW history  Relevant Aspects of Hospital Course:  Admitted on 04/17/2022 for AMS.  Patient Report:   Pt seen in mid-AM. Notable latency of responses. She is unable to answer where she is from or where she is currently living. She appears to be in some distress, but is unable to name why she is upset. She is able to name that we are in a hospital with multiple choice options. She is unable to name what city the hospital is in. She states her name is April [name] - states that is not her legal name - needed to have April [name] (pronounced April [name]). supplied (could not name). Later stated that she does not go my April [name] and goes by April [name].   Mental status exam is notable for a patient who is relatively alert but exhibits severe speech latency and is unable to follow complex commands or engage in tasks requiring minimal amounts of attention.  Physical exam is as documented below.  States "I'm cold". She does not know when her last bowel movement was. She has difficulty stating whether or not she is in pain.   Tremor has returned.    11/29 Pt with ongoing speech latency and lability - latency with some improvement. Able to keep her mind on question asked even if there is a 10-20 second delay in answering which is a definite improvement. Now more oriented to self, location - does not know month. Gave season of "fall" and knew  the year. Knows current president and got immediate prior with significant delay. No waxy flexibility observed.   Stated mood is "sad". Has not been eating much. NO SI/HI, no AH/VH.   Daughter at bedside with pt's assent.   ROS:  As above  Collateral information:  Elwin Mocha, Daughter Level of contact: few times per week via phone, she lives in [place], hard to visit; sister [name] is a drug addict and steals things.  Daughter: Saturday, converse but confused  Functional September - took care of April [name] after her gallbladder surgery  Going on for two months: hall, del, no strength D/t not taking meds, taking other meds, permanent damage due to drugs I know she's not taking her meds, abusing lithium and seroquel   Manic episodes - yes, unsure if in context of drugs Drugs - cocaine, a lot - nose damage found recently Psych hosp: no SI: yes, about living with sis, OD on cocaine SA: no Patient is tired of living with [name], she's stealing money, but the patient has no financial means, cannot live with April [name] b/c she has a son Took 3-4 days to improve at [place], only normal at day 6  Psychiatric History:  Information collected from patient, EMR  Family psych history: none   Social History:  As above   Family  History:  The patient's family history includes Alcoholism in her father; Heart disease in her father; Lung cancer in her mother.  Medical History: Past Medical History:  Diagnosis Date   ADHD (attention deficit hyperactivity disorder)    Anemia    years ago   Anxiety    Arthritis    Asthma    Cancer ([place])    uterine cancer, hysterectomy - ?2005   Chronic pain 9/56/3875   Complication of anesthesia    woke up with an anxiety attack with last surgery (2013), pt. reports that she has high tolerance to medicine, also has woken during anesth.    COPD (chronic obstructive pulmonary disease) (HCC)    CRPS (complex regional pain syndrome type II)     Depression    Family history of adverse reaction to anesthesia    " mother had difficulty waking "  "had alot of issues, including infection".   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   Neuropathic pain 12/10/2014   Pneumonia 2012   Shortness of breath dyspnea     Surgical History: Past Surgical History:  Procedure Laterality Date   BUNIONECTOMY Bilateral    COLONOSCOPY     DILATION AND CURETTAGE OF UTERUS     ESOPHAGOGASTRODUODENOSCOPY     FRACTURE SURGERY     right tibia   HARDWARE REMOVAL Right 04/02/2014   Procedure: AND HARDWARE REMOVAL ;  Surgeon: Rozanna Box, MD;  Location: [place];  Service: Orthopedics;  Laterality: Right;   HARDWARE REMOVAL Right 07/16/2014   Procedure: HARDWARE REMOVAL;  Surgeon: Rozanna Box, MD;  Location: [facility];  Service: Orthopedics;  Laterality: Right;   HARDWARE REMOVAL Right 12/05/2014   Procedure: HARDWARE REMOVAL RIGHT TIBIAL;  Surgeon: Altamese Rockwall, MD;  Location: [place];  Service: Orthopedics;  Laterality: Right;   HARDWARE REMOVAL Right 03/31/2017   Procedure: HARDWARE REMOVAL RIGHT TIBIA (SCREWS ONLY);  Surgeon: Altamese [place]ton, MD;  Location: [place];  Service: Orthopedics;  Laterality: Right;   HARVEST BONE GRAFT Right 04/02/2014   Procedure:  AND ILIAC BONE GRAFT;  Surgeon: Rozanna Box, MD;  Location: [facility];  Service: Orthopedics;  Laterality: Right;   ORIF TIBIA FRACTURE Right 04/02/2014   Procedure: OPEN REDUCTION INTERNAL FIXATION (ORIF) RIGHT DISTAL TIBIA FRACTURE;  Surgeon: Rozanna Box, MD;  Location: [place];  Service: Orthopedics;  Laterality: Right;   TIBIA IM NAIL INSERTION Right 12/05/2014   Procedure: INTRAMEDULLARY (IM) NAIL RIGHT  TIBIAL;  Surgeon: Altamese , MD;  Location: [place];  Service: Orthopedics;  Laterality: Right;   TIBIA  OSTEOTOMY Right 07/16/2014   Procedure: RIGHT TIBIAL OSTEOTOMY;  Surgeon: Rozanna Box, MD;  Location: [place];  Service: Orthopedics;  Laterality: Right;   TONSILLECTOMY     TUBAL LIGATION     VAGINAL HYSTERECTOMY  2000   WISDOM TOOTH EXTRACTION      Medications:   Current Facility-Administered Medications:    acetaminophen (TYLENOL) tablet 650 mg, 650 mg, Oral, Q6H PRN, 650 mg at 04/18/22 2228 **OR** acetaminophen (TYLENOL) suppository 650 mg, 650 mg, Rectal, Q6H PRN, Rick Duff, MD   clonazePAM Bobbye [place]) tablet 0.5 mg, 0.5 mg, Oral, BID, Delene Ruffini, MD   diclofenac Sodium (VOLTAREN) 1 % topical  gel 2 g, 2 g, Topical, BID PRN, Gaylan Gerold, DO   folic acid (FOLVITE) tablet 1 mg, 1 mg, Oral, Daily, Rick Duff, MD, 1 mg at 04/21/22 0840   levothyroxine (SYNTHROID) tablet 50 mcg, 50 mcg, Oral, QAC breakfast, Delene Ruffini, MD, 50 mcg at 04/21/22 8403   liothyronine (CYTOMEL) tablet 5 mcg, 5 mcg, Oral, BID, Delene Ruffini, MD, 5 mcg at 04/21/22 0840   multivitamin with minerals tablet 1 tablet, 1 tablet, Oral, Daily, Rick Duff, MD, 1 tablet at 04/21/22 0840   ondansetron (ZOFRAN) tablet 4 mg, 4 mg, Oral, Q6H PRN **OR** ondansetron (ZOFRAN) injection 4 mg, 4 mg, Intravenous, Q6H PRN, Rick Duff, MD   potassium PHOSPHATE 30 mmol in dextrose 5 % 500 mL infusion, 30 mmol, Intravenous, Once, Delene Ruffini, MD, Last Rate: 85 mL/hr at 04/21/22 1222, 30 mmol at 04/21/22 1222   rivaroxaban (XARELTO) tablet 10 mg, 10 mg, Oral, Daily, Rick Duff, MD, 10 mg at 04/21/22 0840   thiamine (VITAMIN B1) tablet 100 mg, 100 mg, Oral, Daily, Rick Duff, MD, 100 mg at 04/21/22 7543  Allergies: Allergies  Allergen Reactions   Asa [Aspirin] Other (See Comments)    Stomach irritation/upset.       Objective  Vital signs:  Temp:  [97.4 F (36.3 C)-98.3 F (36.8 C)] 98.3 F (36.8 C) (11/29 1529) Pulse Rate:  [74-82] 77 (11/29 1529) Resp:  [16-18]  18 (11/29 1529) BP: (120-136)/(74-83) 123/74 (11/29 1529) SpO2:  [100 %] 100 % (11/29 1529)  Psychiatric Specialty Exam: Physical Exam Constitutional:      Appearance: the patient is not toxic-appearing.  Pulmonary:     Effort: Pulmonary effort is normal.  Neurological:     Mental status as above    No ocular or extremity clonus, no patellar hyperreflexia    No palmar reflex, no mannerisms/stereotypy   Review of Systems  Respiratory:  Negative for shortness of breath.   Cardiovascular:  Negative for chest pain.  Gastrointestinal:  Negative for abdominal pain, constipation, diarrhea, nausea and vomiting.  Neurological:  Negative for headaches.      BP 123/74 (BP Location: Right Arm)   Pulse 77   Temp 98.3 F (36.8 C) (Oral)   Resp 18   SpO2 100%   General Appearance: Fairly Groomed  Eye Contact:  fair  Speech:  garbled  Volume:  Normal  Mood:  "sad"  Affect:  bizarre   Thought Process:  illogical, tangential  Orientation:  Full (Time, Place, and Person)  Thought Content: denies SI/HI/AVH  Suicidal Thoughts:  denies  Homicidal Thoughts:  denies  Memory:  poor  Judgement: impaired  Insight:  impaired  Psychomotor Activity:  reduced  Concentration:  poor  Recall:  poor  Fund of Knowledge: unable to assess  Language: poor  Akathisia:  NA  Handed:    AIMS (if indicated): not done  Assets:  Armed forces logistics/support/administrative officer Desire for Improvement Financial Resources/Insurance Housing Leisure Time Physical Health  ADL's:  Intact  Cognition: WNL  Sleep:  Fair   Corky Sox, MD PGY-2

## 2022-04-22 DIAGNOSIS — G929 Unspecified toxic encephalopathy: Secondary | ICD-10-CM | POA: Diagnosis not present

## 2022-04-22 DIAGNOSIS — T56891A Toxic effect of other metals, accidental (unintentional), initial encounter: Secondary | ICD-10-CM | POA: Diagnosis not present

## 2022-04-22 LAB — BASIC METABOLIC PANEL
Anion gap: 11 (ref 5–15)
BUN: 6 mg/dL (ref 6–20)
CO2: 23 mmol/L (ref 22–32)
Calcium: 9.2 mg/dL (ref 8.9–10.3)
Chloride: 104 mmol/L (ref 98–111)
Creatinine, Ser: 0.78 mg/dL (ref 0.44–1.00)
GFR, Estimated: >60 mL/min (ref >60)
Glucose, Bld: 103 mg/dL — ABNORMAL HIGH (ref 70–99)
Potassium: 4 mmol/L (ref 3.5–5.1)
Sodium: 138 mmol/L (ref 135–145)

## 2022-04-22 LAB — PHOSPHORUS: Phosphorus: 2.5 mg/dL (ref 2.5–4.6)

## 2022-04-22 MED ORDER — LEVOTHYROXINE SODIUM 88 MCG PO TABS
88.0000 ug | ORAL_TABLET | Freq: Every day | ORAL | Status: DC
  Administered 2022-04-23 – 2022-04-26 (×4): 88 ug via ORAL
  Filled 2022-04-22 (×4): qty 1

## 2022-04-22 NOTE — Progress Notes (Signed)
Occupational Therapy Treatment Patient Details Name: April [name] MRN: [id number] DOB: 1970/05/25 Today's Date: 04/22/2022   History of present illness 51 year old person came to the ED 04/18/22 for confusion and admitted with lithium toxicity. PMH bipolar disease, complex regional pain syndrome/fibromyalgia, Graves' disease status post thyroidectomy (02/2021), peripheral neuropathy, anxiety, RLE nerve damage, COPD, and substance use disorder (cocaine and THC)   OT comments  Pt progressing rapidly towards established OT goals, and meeting goals currently established; therefore updated goals to reflect current and desired level of function. Pt performing toilet transfer with min guard A with RW. Performing 3 consecutive grooming tasks at sink with min guard A and cues for RW placement and optimal placement of self in relation to sink. Pt very pleasant and following all one step commands, but requiring up to mod cues to follow 3 step commands. Continue to recommend SNF to optimize safety and independence in ADL and IADL.    Recommendations for follow up therapy are one component of a multi-disciplinary discharge planning process, led by the attending physician.  Recommendations may be updated based on patient status, additional functional criteria and insurance authorization.    Follow Up Recommendations  Skilled nursing-short term rehab (<3 hours/day)     Assistance Recommended at Discharge Frequent or constant Supervision/Assistance  Patient can return home with the following  Assistance with cooking/housework;Direct supervision/assist for medications management;Direct supervision/assist for financial management;Assist for transportation;Help with stairs or ramp for entrance;A little help with walking and/or transfers;A little help with bathing/dressing/bathroom   Equipment Recommendations  Other (comment) (TBD next venue)    Recommendations for Other Services      Precautions /  Restrictions Precautions Precautions: Fall Restrictions Weight Bearing Restrictions: No       Mobility Bed Mobility               General bed mobility comments: In restroom on arrival and recliner upon departure    Transfers Overall transfer level: Needs assistance Equipment used: Rolling walker (2 wheels) Transfers: Sit to/from Stand Sit to Stand: Min guard           General transfer comment: Min guard A for safety     Balance Overall balance assessment: Needs assistance Sitting-balance support: Feet supported, Single extremity supported Sitting balance-Leahy Scale: Fair     Standing balance support: Bilateral upper extremity supported, During functional activity, Reliant on assistive device for balance Standing balance-Leahy Scale: Fair Standing balance comment: reliant on RW                           ADL either performed or assessed with clinical judgement   ADL Overall ADL's : Needs assistance/impaired     Grooming: Min guard;Standing;Wash/dry hands;Wash/dry face;Oral care Grooming Details (indicate cue type and reason): Min guard A to perform these tasks at sink. Initially oriented to R of sink with hunched posture to reach water, but with one verbal cue able to correct.             Lower Body Dressing: Supervision/safety;Sitting/lateral leans Lower Body Dressing Details (indicate cue type and reason): do doff and don sock Toilet Transfer: Min guard;Ambulation;Rolling walker (2 wheels);Regular Glass blower/designer Details (indicate cue type and reason): In restroom on arrival with NT present Toileting- Water quality scientist and Hygiene: Set up;Sitting/lateral lean       Functional mobility during ADLs: Min guard;Rolling walker (2 wheels) General ADL Comments: slowed participation in ADL, but performing with min guard A  Extremity/Trunk Assessment Upper Extremity Assessment Upper Extremity Assessment: Overall WFL for tasks  assessed   Lower Extremity Assessment Lower Extremity Assessment: Generalized weakness        Vision   Vision Assessment?: No apparent visual deficits Additional Comments: WFL for tasks assessed   Perception Perception Perception: Not tested   Praxis Praxis Praxis: Not tested    Cognition Arousal/Alertness: Awake/alert Behavior During Therapy: WFL for tasks assessed/performed, Flat affect (intermittently flat, but overall more expressive this session) Overall Cognitive Status: Impaired/Different from baseline Area of Impairment: Following commands, Safety/judgement, Awareness, Problem solving, Memory, Attention                   Current Attention Level: Sustained, Selective (boputs of selective attention with min background distraction (medical team speaking outside of room) with door open) Memory: Decreased short-term memory, Decreased recall of precautions Following Commands: Follows one step commands consistently, Follows one step commands with increased time, Follows multi-step commands inconsistently (min-mod indirect cues to follow three step command) Safety/Judgement: Decreased awareness of safety, Decreased awareness of deficits (cues for placement of RW at sink and during functional mobility) Awareness: Intellectual Problem Solving: Slow processing, Difficulty sequencing, Requires verbal cues General Comments: Pt following all one step commands; pleasant and with appropriate facial expression ~80% of session. Pt oriented to person, place, time, and event. Following three step command with mod indirect cues, but requesting assist as needed and benefiting from indirect cues. When asked to clap three times, was observed to clap 4 times.        Exercises      Shoulder Instructions       General Comments daughter present and supportive    Pertinent Vitals/ Pain       Pain Assessment Pain Assessment: Faces Faces Pain Scale: No hurt Pain Intervention(s):  Monitored during session  Home Living                                          Prior Functioning/Environment              Frequency  Min 2X/week        Progress Toward Goals  OT Goals(current goals can now be found in the care plan section)  Progress towards OT goals: Progressing toward goals  Acute Rehab OT Goals Patient Stated Goal: get better; have a good living situation OT Goal Formulation: With patient Time For Goal Achievement: 05/03/22 Potential to Achieve Goals: Good ADL Goals Pt Will Perform Lower Body Bathing: with modified independence;sit to/from stand Pt Will Perform Lower Body Dressing: with modified independence;sit to/from stand Pt Will Transfer to Toilet: with modified independence;ambulating;regular height toilet Pt Will Perform Toileting - Clothing Manipulation and hygiene: with modified independence;sit to/from stand Additional ADL Goal #1: Pt will follow three step commands with mod I in order to complete ADL tasks independently and safely. Additional ADL Goal #2: Pt will score a 4 or less on the short blessed test in preparation for participation in IADL.  Plan Discharge plan remains appropriate;Frequency remains appropriate    Co-evaluation                 AM-PAC OT "6 Clicks" Daily Activity     Outcome Measure   Help from another person eating meals?: A Little Help from another person taking care of personal grooming?: A Little Help from another person toileting, which includes using  toliet, bedpan, or urinal?: A Little Help from another person bathing (including washing, rinsing, drying)?: A Little Help from another person to put on and taking off regular upper body clothing?: A Little Help from another person to put on and taking off regular lower body clothing?: A Little 6 Click Score: 18    End of Session Equipment Utilized During Treatment: Gait belt;Rolling walker (2 wheels)  OT Visit Diagnosis: Unsteadiness  on feet (R26.81);Other abnormalities of gait and mobility (R26.89);Repeated falls (R29.6);Muscle weakness (generalized) (M62.81);History of falling (Z91.81);Cognitive communication deficit (R41.841);Other symptoms and signs involving the nervous system (R29.898)   Activity Tolerance Patient tolerated treatment well   Patient Left in chair;with call bell/phone within reach;with chair alarm set;with family/visitor present   Nurse Communication Mobility status        Time: 0981-1914 OT Time Calculation (min): 24 min  Charges: OT General Charges $OT Visit: 1 Visit OT Treatments $Self Care/Home Management : 8-22 mins  Elder Cyphers, OTR/L [facility] Acute Rehabilitation Office: [phone]   Magnus Ivan 04/22/2022, 12:37 PM

## 2022-04-22 NOTE — Plan of Care (Signed)

## 2022-04-22 NOTE — Progress Notes (Signed)
Physical Therapy Treatment Patient Details Name: April [name] MRN: [id number] DOB: 04-Mar-1971 Today's Date: 04/22/2022   History of Present Illness 51 year old person came to the ED 04/18/22 for confusion and admitted with lithium toxicity. PMH bipolar disease, complex regional pain syndrome/fibromyalgia, Graves' disease status post thyroidectomy (02/2021), peripheral neuropathy, anxiety, RLE nerve damage, COPD, and substance use disorder (cocaine and THC)    PT Comments    Pt greeted up in bathroom and agreeable to session with focus on progression of gait. Pt needing min guard throughout gait with RW with no overt LOB noted, chair follow for safety for first gait trial. Pt following all single step commands and pleasant throughout session. Pt continues to be limited by impaired balance/postural reactions, decreased activity tolerance, poor insight in deficits and weakness. Current plan remains appropriate to address deficits and maximize functional independence and safety. Pt continues to benefit from skilled PT services to progress toward functional mobility goals.    Recommendations for follow up therapy are one component of a multi-disciplinary discharge planning process, led by the attending physician.  Recommendations may be updated based on patient status, additional functional criteria and insurance authorization.  Follow Up Recommendations  Skilled nursing-short term rehab (<3 hours/day) Can patient physically be transported by private vehicle: No   Assistance Recommended at Discharge Frequent or constant Supervision/Assistance  Patient can return home with the following Two people to help with walking and/or transfers;Assistance with cooking/housework;Assistance with feeding;Direct supervision/assist for medications management;Direct supervision/assist for financial management;Assist for transportation;Help with stairs or ramp for entrance   Equipment Recommendations  None  recommended by PT    Recommendations for Other Services       Precautions / Restrictions Precautions Precautions: Fall Restrictions Weight Bearing Restrictions: No     Mobility  Bed Mobility               General bed mobility comments: In restroom on arrival and recliner upon departure    Transfers Overall transfer level: Needs assistance Equipment used: Rolling walker (2 wheels) Transfers: Sit to/from Stand Sit to Stand: Min guard           General transfer comment: Min guard A for safety    Ambulation/Gait Ambulation/Gait assistance: Min guard, +2 safety/equipment Gait Distance (Feet): 150 Feet Assistive device: Rolling walker (2 wheels) Gait Pattern/deviations: Step-through pattern, Decreased stride length, Trunk flexed Gait velocity: decr     General Gait Details: cues for RW proximity as pt pushing RW far out in front, no overt LOB   Stairs             Wheelchair Mobility    Modified Rankin (Stroke Patients Only)       Balance Overall balance assessment: Needs assistance Sitting-balance support: Feet supported, Single extremity supported Sitting balance-Leahy Scale: Fair     Standing balance support: Bilateral upper extremity supported, During functional activity, Reliant on assistive device for balance Standing balance-Leahy Scale: Fair Standing balance comment: reliant on RW                            Cognition Arousal/Alertness: Awake/alert Behavior During Therapy: WFL for tasks assessed/performed, Flat affect (intermittently flat, but overall more expressive this session) Overall Cognitive Status: Impaired/Different from baseline Area of Impairment: Following commands, Safety/judgement, Awareness, Problem solving, Memory, Attention                   Current Attention Level: Sustained, Selective (boputs of selective attention  with min background distraction (medical team speaking outside of room) with door  open) Memory: Decreased short-term memory, Decreased recall of precautions Following Commands: Follows one step commands consistently, Follows one step commands with increased time, Follows multi-step commands inconsistently (min-mod indirect cues to follow three step command) Safety/Judgement: Decreased awareness of safety, Decreased awareness of deficits (cues for placement of RW at sink and during functional mobility) Awareness: Intellectual Problem Solving: Slow processing, Difficulty sequencing, Requires verbal cues General Comments: Pt following all one step commands; pleasant and with appropriate facial expression ~80% of session. Pt oriented to person, place, time, and event. Following three step command with mod indirect cues, but requesting assist as needed and benefiting from indirect cues. When asked to clap three times, was observed to clap 4 times.        Exercises      General Comments General comments (skin integrity, edema, etc.): daughter present and supportive      Pertinent Vitals/Pain Pain Assessment Pain Assessment: Faces Faces Pain Scale: No hurt Pain Intervention(s): Monitored during session    Home Living                          Prior Function            PT Goals (current goals can now be found in the care plan section) Acute Rehab PT Goals Patient Stated Goal: to get home to put up christmas tree PT Goal Formulation: Patient unable to participate in goal setting Time For Goal Achievement: 05/03/22 Progress towards PT goals: Progressing toward goals    Frequency    Min 2X/week      PT Plan      Co-evaluation              AM-PAC PT "6 Clicks" Mobility   Outcome Measure  Help needed turning from your back to your side while in a flat bed without using bedrails?: A Lot Help needed moving from lying on your back to sitting on the side of a flat bed without using bedrails?: A Lot Help needed moving to and from a bed to a chair  (including a wheelchair)?: A Little Help needed standing up from a chair using your arms (e.g., wheelchair or bedside chair)?: A Little Help needed to walk in hospital room?: A Little Help needed climbing 3-5 steps with a railing? : A Lot 6 Click Score: 15    End of Session Equipment Utilized During Treatment: Gait belt Activity Tolerance: Patient tolerated treatment well Patient left: in chair;with call bell/phone within reach;with chair alarm set;with family/visitor present Nurse Communication: Mobility status PT Visit Diagnosis: Other abnormalities of gait and mobility (R26.89);Repeated falls (R29.6);Muscle weakness (generalized) (M62.81)     Time: 4097-3532 PT Time Calculation (min) (ACUTE ONLY): 25 min  Charges:  $Gait Training: 8-22 mins                     Liylah Najarro R. PTA Acute Rehabilitation Services Office: [place] 04/22/2022, 1:02 PM

## 2022-04-22 NOTE — Progress Notes (Addendum)
Subjective:  Daughter April [name] reports the patient is getting better today. Her speech is getting a little faster and she is getting closer to baseline. The patient ate some of her lunch yesterday, and April [name] was told the patient ate the majority of her dinner. She reports the patient's outpatient psych NP has dropped the patient due to medication misuse, and she would like support finding a new psychiatrist.   The patient was seen ambulating at a moderate speed with OT today, smiling, engaging in conversation.   Objective:  Vital signs in last 24 hours: Vitals:   04/22/22 0331 04/22/22 0729 04/22/22 1337 04/22/22 1556  BP: 115/85 120/78 117/73 112/80  Pulse: 75 71 70 80  Resp: '18 20 20 20  '$ Temp: 98.6 F (37 C) 98.3 F (36.8 C) 97.7 F (36.5 C) 97.7 F (36.5 C)  TempSrc:  Oral Oral Oral  SpO2: 96% 99% 100% 100%   Weight change:   Intake/Output Summary (Last 24 hours) at 04/22/2022 1607 Last data filed at 04/22/2022 0330 Gross per 24 hour  Intake 640 ml  Output 1600 ml  Net -960 ml   General: Ambulating in the halls with OT without apparent difficulty; No acute distress Pulmonary: Normal respiratory effort Neuro: Moves all extremities spontaneously; normal gait; follows PT commands; appropriate responses to questions Psych: Good eye contact; speech latency improving; intermittently laughing and joking with PT; responds to questions appropriately    Assessment/Plan:  Principal Problem:   Lithium toxicity Active Problems:   Anxiety   Toxic encephalopathy   Bipolar disorder (HCC)   Cocaine use disorder (HCC)   Postoperative hypothyroidism   Abnormal behavior   Encephalopathy   [name] is a 51 yo F with a PMH of bipolar disorder, complex regional pain syndrome/fibromyalgia, Graves' disease status post thyroidectomy (02/2021), peripheral neuropathy, anxiety, and substance use disorder (cocaine and THC) who presents after worsening confusion and imbalance  found to have AKI with elevated lithium level and UDS + for benzodiazepines.    #Toxic encephalopathy #Lithium toxicity  The patient's mental status continues to improve, and she is seen ambulating well with OT today. Psychiatry notes that the patient's is now fully oriented with some difficulty with sustained attention. She is still not ready for more comprehensive psychiatric interview. OT notes the patient is rapidly progressing. They continue to recommend SNF/short-term rehab to optimize safety and independence. CSW is facilitating SNF/short-term rehab placement with daughter April [name], and have offered CST or ACTT for ongoing outpatient psychiatric and rehab needs. CSW is also helping the patient and her daughter contact [facility] for outpatient psychiatry referral.  - f/u psychiatry recommendations  - Clonazepam 0.5 mg BID - Encourage PO fluid intake - CSW outpatient psychiatry referral    #Hypothyroidism s/p total thyroidectomy 02/2021   Discussed today that Levothyroxine 88 mcg would be more appropriate for the patient's weight, rather than 50 mcg. Will adjust dosage. - Change Levothyroxine 88 mcg daily - Continue Cytomel 5 mcg BID  #Hyponatremia #Hypophosphatemia Sodium has corrected to 138 with fluids. Phosphorous has improved to 2.5 with IV potassium phosphate. Daughter April [name] reports the patient is eating more, so will continue to encourage PO intake. Will monitor BMP and replete as needed.  - BMP tomorrow  - phos tomorrow  #Bipolar 2 Disorder   Inpatient psych team believes this is a misdiagnosis.    #CRPS  - gabapentin stopped    #Anxiety  - Clonazepam 0.5 mg BID    LOS: 4 days  Paulo Fruit, Medical Student 04/22/2022, 4:07 PM   Attestation for Student Documentation: I personally was present and performed or re-performed the history, physical exam and medical decision-making activities of this service and have verified that the service and findings  are accurately documented in the student's note. Delene Ruffini, MD 04/22/2022, 6:26 PM

## 2022-04-22 NOTE — Consult Note (Signed)
[facility] Followup Face-to-Face Psychiatric Evaluation   Service Date: April 22, 2022 LOS:  LOS: 4 days    Assessment  April [name] is a 51 year old female with a history of bipolar affective disorder, cocaine use disorder, and chronic pain.  No previous psychiatric hospitalizations can be found in the medical record.  Her past medical history is notable for Graves' disease s/p thyroidectomy.  She was medically admitted on 11/25 for encephalopathy thought to be due to lithium toxicity, with hyperthyroidism as another consideration.  Psychiatry consult was requested for medication assistance.  On assessment 11/28 the patient is still encephalopathic, with inattention, speech latency, trouble following complex commands. Low suspicion for catatonia or serotonin syndrome based on our exam. (No ocular clonus or clonus of the extremities, no hyperreflexia noted: no grasp reflex, mutism, immobility, or stereotypy/mannerisms).  Suspect continued toxic encephalopathy due to lithium toxicity which only recently resolved (lithium level has been normal for less than 24 hours at this point).  Suspect hypothyroidism is also contributing.  Management per primary.  Will also continue to consider catatonia.  Her psychotropic medication has been restarted as below and is appropriate for the moment.  With regard to safety, the patient has no prior history of psychiatric hospitalization or suicide attempts or even making suicidal statements according to her daughter, April [name].  Despite this, it is concerning that she took enough lithium to have a level above 2.  We will continue to assess the patient and determine if there was any intention to harm herself with the excessive ingestion.  Regarding outpatient follow-up, it is clear that the patient should no longer be taking lithium, given that she has been found to have supratherapeutic levels during 2 hospitalizations related to altered mental  status.  Plan to call and discussed this with her outpatient psychiatric provider April [name], [phone].   11/29 Patient with moderately improved mentation today.  Alert and somewhat oriented.  Tangential responses with long delays but is attempting to converse.  Daughter at bedside provides information regarding the patient's previous substance use and abuse of prescription drugs.  Called patient's outpatient provider at the number above.  He agrees that the patient should not be prescribed lithium or Seroquel.  He states the patient has not had any outpatient lithium levels despite her being on medication for 3 months.  Will continue to assess concern for potential suicide attempt once the patient's mentation is more appropriate. Will also consider new medication regimen once we can further assess patient's psychiatric symptoms. Will attempt to find another provider for the patient to follow up with upon D/C.  11/30 Mentation continues to improve, still not ready for more comprehensive psychiatric interview and safety assessment. LCSW working on outpatient follow up, states patient may be able to get CST.   Diagnoses:  Active Hospital problems: Principal Problem:   Lithium toxicity Active Problems:   Anxiety   Toxic encephalopathy   Bipolar disorder (HCC)   Cocaine use disorder (HCC)   Postoperative hypothyroidism   Abnormal behavior   Encephalopathy     Plan  ## Safety and Observation Level:  - Based on my clinical evaluation, I estimate the patient to be at low risk of self harm in the current setting - At this time, we recommend a routine level of observation. This decision is based on my review of the chart including patient's history and current presentation, interview of the patient, mental status examination, and consideration of suicide risk including evaluating suicidal  ideation, plan, intent, suicidal or self-harm behaviors, risk factors, and protective factors. This  judgment is based on our ability to directly address suicide risk, implement suicide prevention strategies and develop a safety plan while the patient is in the clinical setting. Please contact our team if there is a concern that risk level has changed.   ## Medications:  -- Discontinue home Seroquel - Continue to hold home Risperdal - Continue to hold home lithium - Continue home Klonopin at a reduced dose of 0.5 mg twice daily  ## Medical Decision Making Capacity:  Not assessed on this encounter  ## Further Work-up:  Per primary  ## Disposition:  TBD  ## Behavioral / Environmental:  --Routine obs  ##Legal Status VOL  Thank you for this consult request. Recommendations have been communicated to the primary team.  We will follow at this time.   Corky Sox, MD   NEW history  Relevant Aspects of Hospital Course:  Admitted on 04/17/2022 for AMS.  Patient Report:   Pt seen in mid-AM. Notable latency of responses. She is unable to answer where she is from or where she is currently living. She appears to be in some distress, but is unable to name why she is upset. She is able to name that we are in a hospital with multiple choice options. She is unable to name what city the hospital is in. She states her name is April [name] - states that is not her legal name - needed to have [place] (pronounced [place]). supplied (could not name). Later stated that she does not go my April [name] and goes by [name].   Mental status exam is notable for a patient who is relatively alert but exhibits severe speech latency and is unable to follow complex commands or engage in tasks requiring minimal amounts of attention.  Physical exam is as documented below.  States "I'm cold". She does not know when her last bowel movement was. She has difficulty stating whether or not she is in pain.   Tremor has returned.    11/29 Pt with ongoing speech latency and lability - latency with some improvement. Able to  keep her mind on question asked even if there is a 10-20 second delay in answering which is a definite improvement. Now more oriented to self, location - does not know month. Gave season of "fall" and knew the year. Knows current president and got immediate prior with significant delay. No waxy flexibility observed.   Stated mood is "sad". Has not been eating much. NO SI/HI, no AH/VH.   Daughter at bedside with pt's assent.    11/30 Fully alert and oriented. Difficulty with sustained attention. xDOWB, xaddition problems. Denies SI/HI/AVH.    ROS:  As above  Collateral information:  Elwin Mocha, Daughter Level of contact: few times per week via phone, she lives in [place], hard to visit; sister [name] is a drug addict and steals things.  Daughter: Saturday, converse but confused  Functional September - took care of April [name] after her gallbladder surgery  Going on for two months: hall, del, no strength D/t not taking meds, taking other meds, permanent damage due to drugs I know she's not taking her meds, abusing lithium and seroquel   Manic episodes - yes, unsure if in context of drugs Drugs - cocaine, a lot - nose damage found recently Psych hosp: no SI: yes, about living with sis, OD on cocaine SA: no Patient is tired of living with [place], she's stealing money, but the  patient has no financial means, cannot live with April [name] b/c she has a son Took 3-4 days to improve at [place], only normal at day 6  Psychiatric History:  Information collected from patient, EMR  Family psych history: none   Social History:  As above   Family History:  The patient's family history includes Alcoholism in her father; Heart disease in her father; Lung cancer in her mother.  Medical History: Past Medical History:  Diagnosis Date   ADHD (attention deficit hyperactivity disorder)    Anemia    years ago   Anxiety    Arthritis    Asthma    Cancer ([place])    uterine cancer,  hysterectomy - ?2005   Chronic pain 9/47/0962   Complication of anesthesia    woke up with an anxiety attack with last surgery (2013), pt. reports that she has high tolerance to medicine, also has woken during anesth.    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   Neuropathic pain 12/10/2014   Pneumonia 2012   Shortness of breath dyspnea     Surgical History: Past Surgical History:  Procedure Laterality Date   BUNIONECTOMY Bilateral    COLONOSCOPY     DILATION AND CURETTAGE OF UTERUS     ESOPHAGOGASTRODUODENOSCOPY     FRACTURE SURGERY     right tibia   HARDWARE REMOVAL Right 04/02/2014   Procedure: AND HARDWARE REMOVAL ;  Surgeon: Rozanna Box, MD;  Location: [facility];  Service: Orthopedics;  Laterality: Right;   HARDWARE REMOVAL Right 07/16/2014   Procedure: HARDWARE REMOVAL;  Surgeon: Rozanna Box, MD;  Location: [facility];  Service: Orthopedics;  Laterality: Right;   HARDWARE REMOVAL Right 12/05/2014   Procedure: HARDWARE REMOVAL RIGHT TIBIAL;  Surgeon: Altamese Morven, MD;  Location: [facility];  Service: Orthopedics;  Laterality: Right;   HARDWARE REMOVAL Right 03/31/2017   Procedure: HARDWARE REMOVAL RIGHT TIBIA (SCREWS ONLY);  Surgeon: Altamese [place], MD;  Location: [facility];  Service: Orthopedics;  Laterality: Right;   HARVEST BONE GRAFT Right 04/02/2014   Procedure:  AND ILIAC BONE GRAFT;  Surgeon: Rozanna Box, MD;  Location: [facility];  Service: Orthopedics;  Laterality: Right;   ORIF TIBIA FRACTURE Right 04/02/2014   Procedure: OPEN REDUCTION INTERNAL  FIXATION (ORIF) RIGHT DISTAL TIBIA FRACTURE;  Surgeon: Rozanna Box, MD;  Location: [facility];  Service: Orthopedics;  Laterality: Right;   TIBIA IM NAIL INSERTION Right 12/05/2014   Procedure: INTRAMEDULLARY (IM) NAIL RIGHT  TIBIAL;  Surgeon: Altamese [place], MD;  Location: [facility];  Service: Orthopedics;  Laterality: Right;   TIBIA OSTEOTOMY Right 07/16/2014   Procedure: RIGHT TIBIAL OSTEOTOMY;  Surgeon: Rozanna Box, MD;  Location: [facility];  Service: Orthopedics;  Laterality: Right;   TONSILLECTOMY     TUBAL LIGATION     VAGINAL HYSTERECTOMY  2000   WISDOM TOOTH EXTRACTION      Medications:   Current Facility-Administered Medications:    acetaminophen (  TYLENOL) tablet 650 mg, 650 mg, Oral, Q6H PRN, 650 mg at 04/18/22 2228 **OR** acetaminophen (TYLENOL) suppository 650 mg, 650 mg, Rectal, Q6H PRN, Rick Duff, MD   clonazePAM Bobbye [place]) tablet 0.5 mg, 0.5 mg, Oral, BID, Delene Ruffini, MD, 0.5 mg at 04/22/22 9924   diclofenac Sodium (VOLTAREN) 1 % topical gel 2 g, 2 g, Topical, BID PRN, Gaylan Gerold, DO   folic acid (FOLVITE) tablet 1 mg, 1 mg, Oral, Daily, Rick Duff, MD, 1 mg at 04/22/22 2683   levothyroxine (SYNTHROID) tablet 50 mcg, 50 mcg, Oral, QAC breakfast, Delene Ruffini, MD, 50 mcg at 04/22/22 4196   liothyronine (CYTOMEL) tablet 5 mcg, 5 mcg, Oral, BID, Delene Ruffini, MD, 5 mcg at 04/22/22 0934   multivitamin with minerals tablet 1 tablet, 1 tablet, Oral, Daily, Rick Duff, MD, 1 tablet at 04/22/22 0934   ondansetron (ZOFRAN) tablet 4 mg, 4 mg, Oral, Q6H PRN **OR** ondansetron (ZOFRAN) injection 4 mg, 4 mg, Intravenous, Q6H PRN, Rick Duff, MD   rivaroxaban Alveda Reasons) tablet 10 mg, 10 mg, Oral, Daily, Rick Duff, MD, 10 mg at 04/22/22 2229   thiamine (VITAMIN B1) tablet 100 mg, 100 mg, Oral, Daily, Rick Duff, MD, 100 mg at 04/22/22 7989  Allergies: Allergies  Allergen Reactions   Asa [Aspirin] Other (See Comments)    Stomach  irritation/upset.       Objective  Vital signs:  Temp:  [98.3 F (36.8 C)-98.6 F (37 C)] 98.3 F (36.8 C) (11/30 0729) Pulse Rate:  [71-79] 71 (11/30 0729) Resp:  [16-20] 20 (11/30 0729) BP: (104-123)/(70-85) 120/78 (11/30 0729) SpO2:  [96 %-100 %] 99 % (11/30 0729)  Psychiatric Specialty Exam: Physical Exam Constitutional:      Appearance: the patient is not toxic-appearing.  Pulmonary:     Effort: Pulmonary effort is normal.  Neurological:     Mental status as above    No ocular or extremity clonus, no patellar hyperreflexia    No palmar reflex, no mannerisms/stereotypy   Review of Systems  Respiratory:  Negative for shortness of breath.   Cardiovascular:  Negative for chest pain.  Gastrointestinal:  Negative for abdominal pain, constipation, diarrhea, nausea and vomiting.  Neurological:  Negative for headaches.      BP 120/78 (BP Location: Left Arm)   Pulse 71   Temp 98.3 F (36.8 C) (Oral)   Resp 20   SpO2 99%   General Appearance: Fairly Groomed  Eye Contact:  fair  Speech:  garbled  Volume:  Normal  Mood:  "better"  Affect:  congruent, euthymic  Thought Process:  illogical, tangential  Orientation:  Full (Time, Place, and Person)  Thought Content: denies SI/HI/AVH  Suicidal Thoughts:  denies  Homicidal Thoughts:  denies  Memory:  poor  Judgement: impaired  Insight:  impaired  Psychomotor Activity:  reduced  Concentration:  poor  Recall:  poor  Fund of Knowledge: unable to assess  Language: poor  Akathisia:  NA  Handed:    AIMS (if indicated): not done  Assets:  Armed forces logistics/support/administrative officer Desire for Improvement Financial Resources/Insurance Housing Leisure Time Physical Health  ADL's:  Intact  Cognition: WNL  Sleep:  Fair   Corky Sox, MD PGY-2

## 2022-04-22 NOTE — TOC Progression Note (Signed)
Transition of Care [facility]) - Progression Note    Patient Details  Name: April [name] MRN: [id number] Date of Birth: Sep 28, 1970  [facility] CM/SW Contact  Jinger Neighbors, [place] Phone Number: 04/22/2022, 10:13 AM  Clinical Narrative:    CSW conversed with Pt's daughter regarding her choice of SNF. She preferred [facility]; however, CSW called to find out when bed side review would be completed and Raquel Sarna informed CSW they currently have no female beds. CSW spoke with Altha Harm at [facility] to inquire about bed side review and she reports Shawn in the Clinical Liaison who will complete the assessment. CSW contacted Shawn and he believes he will meet with patient on tomorrow. He emailed CSW an updated list for the [place] and CSW will provide to daughter to review. CSW consulted with Dr. Alvie Heidelberg after review of his note. CSW asked about his recommendation for another Psychiatrist, post d/c, further stating CSW will assist patient and her daughter if that is what he is recommending. Dr. Alvie Heidelberg asked for CSW to assist with linkage; CSW will help pt and her daughter contact [facility] to get a CCA scheduled.    Expected Discharge Plan: [place] Barriers to Discharge: SNF Pending transportation, SNF Pending bed offer, SNF Pending discharge orders, Inadequate or no insurance  Expected Discharge Plan and Services Expected Discharge Plan: [place] Choice: [place] arrangements for the past 2 months: Single Family Home                                       Social Determinants of Health (SDOH) Interventions    Readmission Risk Interventions     No data to display

## 2022-04-23 DIAGNOSIS — F3181 Bipolar II disorder: Secondary | ICD-10-CM

## 2022-04-23 DIAGNOSIS — F419 Anxiety disorder, unspecified: Secondary | ICD-10-CM

## 2022-04-23 DIAGNOSIS — F1290 Cannabis use, unspecified, uncomplicated: Secondary | ICD-10-CM | POA: Diagnosis present

## 2022-04-23 LAB — BASIC METABOLIC PANEL
Anion gap: 6 (ref 5–15)
BUN: 7 mg/dL (ref 6–20)
CO2: 24 mmol/L (ref 22–32)
Calcium: 8.9 mg/dL (ref 8.9–10.3)
Chloride: 106 mmol/L (ref 98–111)
Creatinine, Ser: 0.84 mg/dL (ref 0.44–1.00)
GFR, Estimated: >60 mL/min (ref >60)
Glucose, Bld: 103 mg/dL — ABNORMAL HIGH (ref 70–99)
Potassium: 3.9 mmol/L (ref 3.5–5.1)
Sodium: 136 mmol/L (ref 135–145)

## 2022-04-23 LAB — PHOSPHORUS: Phosphorus: 3.3 mg/dL (ref 2.5–4.6)

## 2022-04-23 MED ORDER — ARIPIPRAZOLE 10 MG PO TABS
5.0000 mg | ORAL_TABLET | Freq: Once | ORAL | Status: AC
  Administered 2022-04-23: 5 mg via ORAL
  Filled 2022-04-23: qty 1

## 2022-04-23 MED ORDER — ARIPIPRAZOLE 5 MG PO TABS
10.0000 mg | ORAL_TABLET | Freq: Every day | ORAL | Status: DC
  Administered 2022-04-24 – 2022-04-26 (×3): 10 mg via ORAL
  Filled 2022-04-23: qty 1
  Filled 2022-04-23 (×2): qty 2

## 2022-04-23 MED ORDER — IBUPROFEN 200 MG PO TABS
600.0000 mg | ORAL_TABLET | Freq: Four times a day (QID) | ORAL | Status: DC | PRN
  Administered 2022-04-23 – 2022-04-26 (×7): 600 mg via ORAL
  Filled 2022-04-23 (×9): qty 3

## 2022-04-23 MED ORDER — MIRTAZAPINE 15 MG PO TABS
7.5000 mg | ORAL_TABLET | Freq: Every day | ORAL | Status: DC
  Administered 2022-04-23 – 2022-04-25 (×3): 7.5 mg via ORAL
  Filled 2022-04-23 (×3): qty 1

## 2022-04-23 MED ORDER — CLONAZEPAM 0.25 MG PO TBDP
0.2500 mg | ORAL_TABLET | Freq: Two times a day (BID) | ORAL | Status: DC
  Administered 2022-04-23 – 2022-04-26 (×6): 0.25 mg via ORAL
  Filled 2022-04-23 (×6): qty 1

## 2022-04-23 NOTE — Consult Note (Addendum)
[facility] Followup Face-to-Face Psychiatric Evaluation   Service Date: April 23, 2022 LOS:  LOS: 5 days   Assessment  April [name] is a 51 year old female with a history of bipolar affective disorder, cocaine use disorder, and chronic pain.  No previous psychiatric hospitalizations can be found in the medical record.  Her past medical history is notable for Graves' disease s/p thyroidectomy.  She was medically admitted on 11/25 for encephalopathy thought to be due to lithium toxicity, with hyperthyroidism as another consideration.  Psychiatry consult was requested for medication assistance.  On assessment 11/28 the patient is still encephalopathic, with inattention, speech latency, trouble following complex commands. Low suspicion for catatonia or serotonin syndrome based on our exam. (No ocular clonus or clonus of the extremities, no hyperreflexia noted: no grasp reflex, mutism, immobility, or stereotypy/mannerisms).  Suspect continued toxic encephalopathy due to lithium toxicity which only recently resolved (lithium level has been normal for less than 24 hours at this point).  Suspect hypothyroidism is also contributing.  Management per primary.  Will also continue to consider catatonia.  Her psychotropic medication has been restarted as below and is appropriate for the moment.  With regard to safety, the patient has no prior history of psychiatric hospitalization or suicide attempts or even making suicidal statements according to her daughter, April [name].  Despite this, it is concerning that she took enough lithium to have a level above 2.  We will continue to assess the patient and determine if there was any intention to harm herself with the excessive ingestion.  Regarding outpatient follow-up, it is clear that the patient should no longer be taking lithium, given that she has been found to have supratherapeutic levels during 2 hospitalizations related to altered mental status.   Plan to call and discussed this with her outpatient psychiatric provider Gaetano Net, [phone].   11/29 Patient with moderately improved mentation today.  Alert and somewhat oriented.  Tangential responses with long delays but is attempting to converse.  Daughter at bedside provides information regarding the patient's previous substance use and abuse of prescription drugs.  Called patient's outpatient provider at the number above.  He agrees that the patient should not be prescribed lithium or Seroquel.  He states the patient has not had any outpatient lithium levels despite her being on medication for 3 months.  Will continue to assess concern for potential suicide attempt once the patient's mentation is more appropriate. Will also consider new medication regimen once we can further assess patient's psychiatric symptoms. Will attempt to find another provider for the patient to follow up with upon D/C.  11/30 Mentation continues to improve, still not ready for more comprehensive psychiatric interview and safety assessment. LCSW working on outpatient follow up, states patient may be able to get CST.   12/1 Continues to have improved mentation but still with increased latency in responses and poor memory. Patient does not report a history of symptoms consistent with a manic episode but could be consistent with hypomanic episodes (this is also complicated but patient's concurrent substance use). Differential for now includes substance-induced mood d/o, bipolar affective d/o (r/o bipolar II). She is very tearful on examination, reports ongoing depressive symptoms since having to give up her grandkids a couple of months ago. Will treat her depression with remeron given reported decreased sleep and appetite as well as abilify for further mood stabilization and to augment remeron. She is interested in abilify LAI. Reports confusion with her lithium dosing contributed to toxicity,  maintains it was not a  suicide attempt. She reported ongoing cannabis use, encouraged cannabis cessation.     Diagnoses:  Active Hospital problems: Principal Problem:   Lithium toxicity Active Problems:   Anxiety   Toxic encephalopathy   Bipolar disorder (HCC)   Cocaine use disorder (HCC)   Postoperative hypothyroidism   Abnormal behavior   Encephalopathy     Plan  ## Safety and Observation Level:  - Based on my clinical evaluation, I estimate the patient to be at low risk of self harm in the current setting - At this time, we recommend a routine level of observation. This decision is based on my review of the chart including patient's history and current presentation, interview of the patient, mental status examination, and consideration of suicide risk including evaluating suicidal ideation, plan, intent, suicidal or self-harm behaviors, risk factors, and protective factors. This judgment is based on our ability to directly address suicide risk, implement suicide prevention strategies and develop a safety plan while the patient is in the clinical setting. Please contact our team if there is a concern that risk level has changed.  ## Medications:  -- Discontinue home Seroquel - Continue to hold home Risperdal - Continue to hold home lithium - DECREASE home Klonopin at a reduced dose of 0.25 mg twice daily -- START remeron 7.'5mg'$  daily for depression -- START abilify '5mg'$  daily for depression, mood stabilization today, increase to '10mg'$  tomorrow, consider LAI on Monday  ## Medical Decision Making Capacity:  Not assessed on this encounter  ## Further Work-up:  Per primary  ## Disposition:  TBD  ## Behavioral / Environmental:  --Routine obs  ##Legal Status VOL  Thank you for this consult request. Recommendations have been communicated to the primary team.  We will follow at this time.   Rolanda Lundborg, MD   NEW history  Relevant Aspects of Hospital Course:  Admitted on 04/17/2022 for  AMS.  Patient Report:  11/28: Pt seen in mid-AM. Notable latency of responses. She is unable to answer where she is from or where she is currently living. She appears to be in some distress, but is unable to name why she is upset. She is able to name that we are in a hospital with multiple choice options. She is unable to name what city the hospital is in. She states her name is April [name] - states that is not her legal name - needed to have [place] (pronounced [place]). supplied (could not name). Later stated that she does not go my April [name] and goes by [name].   Mental status exam is notable for a patient who is relatively alert but exhibits severe speech latency and is unable to follow complex commands or engage in tasks requiring minimal amounts of attention.  Physical exam is as documented below.  States "I'm cold". She does not know when her last bowel movement was. She has difficulty stating whether or not she is in pain.   Tremor has returned.   11/29: Pt with ongoing speech latency and lability - latency with some improvement. Able to keep her mind on question asked even if there is a 10-20 second delay in answering which is a definite improvement. Now more oriented to self, location - does not know month. Gave season of "fall" and knew the year. Knows current president and got immediate prior with significant delay. No waxy flexibility observed.   Stated mood is "sad". Has not been eating much. NO SI/HI, no AH/VH.   Daughter  at bedside with pt's assent.   11/30: Fully alert and oriented. Difficulty with sustained attention. xDOWB, xaddition problems. Denies SI/HI/AVH.   12/1: Alert and answering questions appropriately. Denies SI/HI/AVH. Denies paranoia, delusions. Intermittently tearful on examination. Reports that she has been feeling depressed since she had to give up her grandkids due to financial difficulties. Reports she last took cocaine 3 months ago after she had to give up the kids.  She reports increased guilt, depression, difficulty sleeping, decreased appetite (5-6lb weight loss in the past month), decreased energy, difficulty with concentration. She reports anhedonia (did not want to cook). She reports prior times where she would have a week of increased energy. She reports she has never been hospitalized for these episodes. She reports she was sleeping 6 hours a day during these times. She reported increased self-esteem. She reported marijuana use - once a day at night with edibles, vaping. She reports she is not interested in outpatient substance use counseling or inpatient rehab at this time but she is interested in outpatient telehealth therapy.   ROS:  As above  Collateral information:  Elwin Mocha, Daughter Level of contact: few times per week via phone, she lives in [place], hard to visit; sister [name] is a drug addict and steals things.  Daughter: Saturday, converse but confused  Functional September - took care of April [name] after her gallbladder surgery  Going on for two months: hall, del, no strength D/t not taking meds, taking other meds, permanent damage due to drugs I know she's not taking her meds, abusing lithium and seroquel   Manic episodes - yes, unsure if in context of drugs Drugs - cocaine, a lot - nose damage found recently Psych hosp: no SI: yes, about living with sis, OD on cocaine SA: no Patient is tired of living with [name], she's stealing money, but the patient has no financial means, cannot live with April [name] b/c she has a son Took 3-4 days to improve at [place], only normal at day 6  Per collateral info from outpatient psychiatrist from Dr. Alvie Heidelberg:  -Patient was placed on lithium after her daughter was diagnosed with a bipolar disorder and started on lithium and it worked well for her.    Psychiatric History:  Information collected from patient, EMR  Family psych history: none  Social History:  As above  Family History:   The patient's family history includes Alcoholism in her father; Heart disease in her father; Lung cancer in her mother.  Medical History: Past Medical History:  Diagnosis Date   ADHD (attention deficit hyperactivity disorder)    Anemia    years ago   Anxiety    Arthritis    Asthma    Cancer ([place])    uterine cancer, hysterectomy - ?2005   Chronic pain 0/97/3532   Complication of anesthesia    woke up with an anxiety attack with last surgery (2013), pt. reports that she has high tolerance to medicine, also has woken during anesth.    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   Fibromyalgia    GERD (gastroesophageal reflux disease)    years ago   Headache    migraines    History of hiatal hernia    years ago   Irritable bowel syndrome  Neuromuscular disorder ([place])    nerve damage in right leg   Neuropathic pain 12/10/2014   Pneumonia 2012   Shortness of breath dyspnea     Surgical History: Past Surgical History:  Procedure Laterality Date   BUNIONECTOMY Bilateral    COLONOSCOPY     DILATION AND CURETTAGE OF UTERUS     ESOPHAGOGASTRODUODENOSCOPY     FRACTURE SURGERY     right tibia   HARDWARE REMOVAL Right 04/02/2014   Procedure: AND HARDWARE REMOVAL ;  Surgeon: Rozanna Box, MD;  Location: [facility];  Service: Orthopedics;  Laterality: Right;   HARDWARE REMOVAL Right 07/16/2014   Procedure: HARDWARE REMOVAL;  Surgeon: Rozanna Box, MD;  Location: [facility];  Service: Orthopedics;  Laterality: Right;   HARDWARE REMOVAL Right 12/05/2014   Procedure: HARDWARE REMOVAL RIGHT TIBIAL;  Surgeon: Altamese [place], MD;  Location: [facility];  Service: Orthopedics;  Laterality: Right;   HARDWARE REMOVAL Right 03/31/2017   Procedure: HARDWARE REMOVAL RIGHT TIBIA (SCREWS ONLY);  Surgeon:  Altamese [place], MD;  Location: [facility];  Service: Orthopedics;  Laterality: Right;   HARVEST BONE GRAFT Right 04/02/2014   Procedure:  AND ILIAC BONE GRAFT;  Surgeon: Rozanna Box, MD;  Location: [facility];  Service: Orthopedics;  Laterality: Right;   ORIF TIBIA FRACTURE Right 04/02/2014   Procedure: OPEN REDUCTION INTERNAL FIXATION (ORIF) RIGHT DISTAL TIBIA FRACTURE;  Surgeon: Rozanna Box, MD;  Location: [facility];  Service: Orthopedics;  Laterality: Right;   TIBIA IM NAIL INSERTION Right 12/05/2014   Procedure: INTRAMEDULLARY (IM) NAIL RIGHT  TIBIAL;  Surgeon: Altamese [place], MD;  Location: [facility];  Service: Orthopedics;  Laterality: Right;   TIBIA OSTEOTOMY Right 07/16/2014   Procedure: RIGHT TIBIAL OSTEOTOMY;  Surgeon: Rozanna Box, MD;  Location: [facility];  Service: Orthopedics;  Laterality: Right;   TONSILLECTOMY     TUBAL LIGATION     VAGINAL HYSTERECTOMY  2000   WISDOM TOOTH EXTRACTION      Medications:   Current Facility-Administered Medications:    acetaminophen (TYLENOL) tablet 650 mg, 650 mg, Oral, Q6H PRN, 650 mg at 04/23/22 1017 **OR** acetaminophen (TYLENOL) suppository 650 mg, 650 mg, Rectal, Q6H PRN, Rick Duff, MD   [COMPLETED] ARIPiprazole (ABILIFY) tablet 5 mg, 5 mg, Oral, Once, 5 mg at 04/23/22 1402 **FOLLOWED BY** [START ON 04/24/2022] ARIPiprazole (ABILIFY) tablet 10 mg, 10 mg, Oral, Daily, Lavella Hammock, MD   clonazePAM Bobbye [place]) disintegrating tablet 0.25 mg, 0.25 mg, Oral, BID, Lavella Hammock, MD   diclofenac Sodium (VOLTAREN) 1 % topical gel 2 g, 2 g, Topical, BID PRN, Gaylan Gerold, DO   folic acid (FOLVITE) tablet 1 mg, 1 mg, Oral, Daily, Rick Duff, MD, 1 mg at 04/23/22 5102   ibuprofen (ADVIL) tablet 600 mg, 600 mg, Oral, Q6H PRN, Delene Ruffini, MD, 600 mg at 04/23/22 1402   levothyroxine (SYNTHROID) tablet 88 mcg, 88 mcg, Oral, QAC breakfast, Delene Ruffini, MD, 88 mcg at 04/23/22 5852   liothyronine (CYTOMEL) tablet 5 mcg, 5 mcg, Oral, BID,  Delene Ruffini, MD, 5 mcg at 04/23/22 0813   mirtazapine (REMERON) tablet 7.5 mg, 7.5 mg, Oral, QHS, Lavella Hammock, MD   multivitamin with minerals tablet 1 tablet, 1 tablet, Oral, Daily, Rick Duff, MD, 1 tablet at 04/23/22 0812   ondansetron (ZOFRAN) tablet 4 mg, 4 mg, Oral, Q6H PRN **OR** ondansetron (ZOFRAN) injection 4 mg, 4 mg, Intravenous, Q6H PRN, Rick Duff, MD   rivaroxaban Alveda Reasons) tablet 10 mg, 10 mg, Oral,  Daily, Rick Duff, MD, 10 mg at 04/23/22 5974   thiamine (VITAMIN B1) tablet 100 mg, 100 mg, Oral, Daily, Rick Duff, MD, 100 mg at 04/23/22 1638  Allergies: Allergies  Allergen Reactions   Asa [Aspirin] Other (See Comments)    Stomach irritation/upset.     Objective  Vital signs:  Temp:  [97.6 F (36.4 C)-98.7 F (37.1 C)] 98.1 F (36.7 C) (12/01 1210) Pulse Rate:  [73-81] 73 (12/01 1210) Resp:  [16-20] 16 (12/01 1210) BP: (100-118)/(68-80) 118/80 (12/01 1210) SpO2:  [98 %-100 %] 100 % (12/01 1210)  Psychiatric Specialty Exam: Physical Exam Constitutional:      Appearance: the patient is not toxic-appearing.  Pulmonary:     Effort: Pulmonary effort is normal.  Neurological:     Alert and answering questions appropriately  Review of Systems  Respiratory:  Negative for shortness of breath.   Cardiovascular:  Negative for chest pain.  Gastrointestinal:  Negative for abdominal pain, constipation, diarrhea, nausea and vomiting.  Neurological:  Negative for headaches.   BP 118/80 (BP Location: Right Arm)   Pulse 73   Temp 98.1 F (36.7 C) (Oral)   Resp 16   SpO2 100%   General Appearance: Fairly Groomed  Eye Contact:  Fair  Speech: Delayed latency in responses.   Volume:  Normal  Mood:  "Depressed"  Affect:  Congruent, tearful  Thought Process:  linear, goal-directed  Orientation:  Full (Time, Place, and Person)  Thought Content: denies SI/HI/AVH  Suicidal Thoughts:  denies  Homicidal Thoughts:  denies  Memory:  poor   Judgement: impaired  Insight:  impaired  Psychomotor Activity:  no psychomotor slowing or agitation observed  Concentration:  poor  Recall:  poor  Fund of Knowledge: unable to assess  Language: poor  Akathisia:  NA  Handed:    AIMS (if indicated): not done  Assets:  Armed forces logistics/support/administrative officer Desire for Improvement Financial Resources/Insurance Housing Leisure Time Physical Health  ADL's:  Intact  Cognition: WNL  Sleep:  Fair   Rolanda Lundborg, MD, PGY-1

## 2022-04-23 NOTE — Progress Notes (Cosign Needed Addendum)
Subjective:  The patient reports a headache located on both sides to the back of her head and describes it as throbbing. The pain sometimes shoots forward to the top of her head. She also endorses photosensitivity. Denies visual changes, floaters, unilateral weakness, paresthesias, and nausea. She was given Tylenol with no relief. She has a history of migraines for which she took medication in the past, though she does not remember the medication. Her past migraines improved once she changed her diet.   She reports feeling today like she "is waking up". She last felt like her normal self 3 years ago, and has not been aware of what was going on around her and how her mental status had declined.She and daughter [name] plan to file a restraining order against her daughter [name] (ongoing heavy substance and prescription medicine abuse) and have her evicted from her home. She used to work for the person who owns her home, who allows her to live there rent-free.  In the meantime, she will live temporarily with dtr [name].   Objective:  Vital signs in last 24 hours: Vitals:   04/22/22 2340 04/23/22 0330 04/23/22 0807 04/23/22 1210  BP: 105/68 100/74 116/76 118/80  Pulse: 77 79 81 73  Resp: '16 18 16 16  '$ Temp: 97.6 F (36.4 C) 97.6 F (36.4 C) 98.1 F (36.7 C) 98.1 F (36.7 C)  TempSrc:   Oral Oral  SpO2: 98% 99% 99% 100%    General: No acute distress.  Looks tired and uncomfortable HEENT: Normocephalic, PEERL,  Pulmonary: Normal respiratory effort MSK: Tight paracervical muscles, neck pain relieved with muscle massage.  Neck movement limited by muscle tension discomfort. Neuro: A&O x 4, oriented to person, place, and time; doesn't remember experiencing admission situation but knows what she was told; PEERL, no facial droop, smile symmetric, tongue midline, sensation grossly intact, moves all extremities, No focal deficits  speech latency improving; responds to questions and instructions  appropriately  Psych: Good eye contact, intermittently tearful (talking about home situation and painful headache), cooperative,  no emotional lability.  Assessment/Plan:  Principal Problem:   Lithium toxicity Active Problems:   Anxiety   Toxic encephalopathy   Bipolar disorder (HCC)   Cocaine use disorder (HCC)   Postoperative hypothyroidism   Abnormal behavior   Encephalopathy   Ms. Burnie Hank is a 51 yo F with a PMH of bipolar disorder, Graves' disease status post thyroidectomy (02/2021), anxiety, and substance use disorder (cocaine and THC) who presents after worsening confusion and imbalance found to have AKI with elevated lithium level and UDS + for benzodiazepines.     #Toxic encephalopathy, resolved #Lithium toxicity, resolved The patient's mentation is now normal; her speech is much faster, she is interactive and conversational, and is able to recall further details about her history. Disposition is being discussed.  Based on the patient's vast clinical improvement, she also may no longer qualify for that level of support, but will follow up with PT/OT recommendations. If the patient is not accepted at SNF, her next option seems to be short-term stay with daughter [name] while they attempt to get a restraining order and evict daughter [name] from the patient's home. Will reach out to see if [name] is open to this plan, and lives in the area to allow for outpatient follow-up in our clinic.  - f/u psychiatry recommendations  - Encourage PO fluid intake - CSW outpatient psychiatry referral and SNF placement, if still appropriate   #Bipolar 2 Disorder    #  Anxiety   Inpatient psych team wish to further explore this diagnosis. They report she does not meet criteria for past manic episodes, but potentially hypomanic episodes. More likely substance-induced mood disorder, but this will need to be determined once the patient is ready for more comprehensive psychiatric interview,  likely in the outpatient setting. Their current working diagnosis is bipolar affective disorder, and they recommend the following regimen: Mirtazapine 7.5 mg qhs (for sleep, appetite, depression), Abilify 10 mg daily (to augment antidepressant and for mood stability), and decreasing her Clonazepam to 0.25 mg BID. CSW is working with daughter [name] to find outpatient psychiatrist and CST/ACTT outpatient support.  - f/u psychiatry recommendations  - Mirtazapine 7.5 mg qhs - Abilify 10 mg daily  - Decrease Clonazepam to 0.25 mg BID - CSW outpatient referrals   # Headache due to paracervical muscle spasm  The patient endorses a posterior headache since last night, not relieved with Tylenol. She was found to have tight paracervical muscles on exam, and the pain was partially relieved with light massage. No neurological symptoms concerning for stroke or SAH. This is consistent with paracervical muscle spasm. Will have heat applied to the area and monitor symptoms.  #Hypothyroidism s/p total thyroidectomy 02/2021    - Continue Levothyroxine 88 mcg daily - Continue Cytomel 5 mcg BID  #Hyponatremia - resolved #Hypophosphatemia - resolved  Sodium remains stable at 136. Phosphorous stable at 3.3. The patient's appetite has improved, so will continue to encourage PO intake. - Encourage PO  #CRPS   - Gabapentin stopped, pain has not recurred    LOS: 5 days   Paulo Fruit, Medical Student 04/23/2022, 3:06 PM   Attestation for Student Documentation: I personally was present and performed or re-performed the history, physical exam and medical decision-making activities of this service and have verified that the service and findings are accurately documented in the student's note. Delene Ruffini, MD 04/23/2022, 5:04 PM

## 2022-04-23 NOTE — TOC Progression Note (Addendum)
Transition of Care [facility]) - Progression Note    Patient Details  Name: April [name] MRN: [id number] Date of Birth: 1970-07-21  [facility] CM/SW Contact  Jinger Neighbors, [place] Phone Number: 04/23/2022, 2:31 PM  Clinical Narrative:    CSW followed up with Raquel Sarna, [place] regarding his bedside review. Shawn reports they will not be able to accept pt due to her poly-substance use and her non-compliance with medications. Pt's daughter does not want her mother going to the other faciity that is pending bed review and she states the facility in [place] is too far. Pt's daughter contacted CSW earlier in the morning stating she did not feel well and may be at the hospital later in the day.  CSW later spoke with pt's daughter and explained the denial from SNF. PT is unable to go to [facility] due to lack of female beds and the other facility "considering" is too far, according to daughter. Discussed other options with pt to go home with home health. Discussed healthy boundary setting with pt and empathized with pt's daughters caregiver burnout'. Will f/u on Monday and call [facility] to schedule a CCA, with hopes of an enhanced service in the community.     Expected Discharge Plan: [place] Barriers to Discharge: SNF Pending transportation, SNF Pending bed offer, SNF Pending discharge orders, Inadequate or no insurance  Expected Discharge Plan and Services Expected Discharge Plan: [place] Choice: [place] arrangements for the past 2 months: Single Family Home                                       Social Determinants of Health (SDOH) Interventions    Readmission Risk Interventions     No data to display

## 2022-04-23 NOTE — Progress Notes (Signed)
Mobility Specialist Progress Note:   04/23/22 0930  Mobility  Activity Ambulated with assistance in hallway  Level of Assistance Contact guard assist, steadying assist  Assistive Device None  Distance Ambulated (ft) 200 ft  Activity Response Tolerated well  Mobility Referral Yes  $Mobility charge 1 Mobility   Pt eager for mobility session. Required steadying assist throughout ambulation with very small shuffling steps throughout. Pt left sitting EOB with all needs met.   Nelta Numbers Mobility Specialist Please contact via SecureChat or  Rehab office at [phone]

## 2022-04-24 ENCOUNTER — Encounter (HOSPITAL_COMMUNITY): Payer: Self-pay | Admitting: Student in an Organized Health Care Education/Training Program

## 2022-04-24 DIAGNOSIS — F3132 Bipolar disorder, current episode depressed, moderate: Secondary | ICD-10-CM

## 2022-04-24 NOTE — Progress Notes (Signed)
Mobility Specialist Progress Note:   04/24/22 0845  Mobility  Activity Ambulated with assistance in hallway  Level of Assistance Contact guard assist, steadying assist  Assistive Device None  Distance Ambulated (ft) 200 ft  Activity Response Tolerated well  Mobility Referral Yes  $Mobility charge 1 Mobility   Pt eager for mobility session. Required steadying assist throughout. Pt left sitting EOB with all needs met.   Nelta Numbers Mobility Specialist Please contact via SecureChat or  Rehab office at [phone]

## 2022-04-24 NOTE — Progress Notes (Signed)
Patient just transferred to 2W-26, and patient's daughter [name] is notified.

## 2022-04-24 NOTE — Consult Note (Signed)
[facility] Followup Face-to-Face Psychiatric Evaluation   Service Date: April 24, 2022 LOS:  LOS: 6 days   Assessment  April [name] is a 51 year old female with a history of bipolar affective disorder, cocaine use disorder, and chronic pain.  No previous psychiatric hospitalizations can be found in the medical record.  Her past medical history is notable for Graves' disease s/p thyroidectomy.  She was medically admitted on 11/25 for encephalopathy thought to be due to lithium toxicity, with hyperthyroidism as another consideration.  Psychiatry consult was requested for medication assistance.  On assessment 11/28 the patient is still encephalopathic, with inattention, speech latency, trouble following complex commands. Low suspicion for catatonia or serotonin syndrome based on our exam. (No ocular clonus or clonus of the extremities, no hyperreflexia noted: no grasp reflex, mutism, immobility, or stereotypy/mannerisms).  Suspect continued toxic encephalopathy due to lithium toxicity which only recently resolved (lithium level has been normal for less than 24 hours at this point).  Suspect hypothyroidism is also contributing.  Management per primary.  Will also continue to consider catatonia.  Her psychotropic medication has been restarted as below and is appropriate for the moment.  With regard to safety, the patient has no prior history of psychiatric hospitalization or suicide attempts or even making suicidal statements according to her daughter, April [name].  Despite this, it is concerning that she took enough lithium to have a level above 2.  We will continue to assess the patient and determine if there was any intention to harm herself with the excessive ingestion.  Regarding outpatient follow-up, it is clear that the patient should no longer be taking lithium, given that she has been found to have supratherapeutic levels during 2 hospitalizations related to altered mental status.   Plan to call and discussed this with her outpatient psychiatric provider April [name], [phone].   11/29 Patient with moderately improved mentation today.  Alert and somewhat oriented.  Tangential responses with long delays but is attempting to converse.  Daughter at bedside provides information regarding the patient's previous substance use and abuse of prescription drugs.  Called patient's outpatient provider at the number above.  He agrees that the patient should not be prescribed lithium or Seroquel.  He states the patient has not had any outpatient lithium levels despite her being on medication for 3 months.  Will continue to assess concern for potential suicide attempt once the patient's mentation is more appropriate. Will also consider new medication regimen once we can further assess patient's psychiatric symptoms. Will attempt to find another provider for the patient to follow up with upon D/C.  11/30 Mentation continues to improve, still not ready for more comprehensive psychiatric interview and safety assessment. LCSW working on outpatient follow up, states patient may be able to get CST.   12/1 Continues to have improved mentation but still with increased latency in responses and poor memory. Patient does not report a history of symptoms consistent with a manic episode but could be consistent with hypomanic episodes (this is also complicated but patient's concurrent substance use). Differential for now includes substance-induced mood d/o, bipolar affective d/o (r/o bipolar II). She is very tearful on examination, reports ongoing depressive symptoms since having to give up her grandkids a couple of months ago. Will treat her depression with remeron given reported decreased sleep and appetite as well as abilify for further mood stabilization and to augment remeron. She is interested in abilify LAI. Reports confusion with her lithium dosing contributed to toxicity,  maintains it was not a  suicide attempt. She reported ongoing cannabis use, encouraged cannabis cessation.  04/24/22:    Patient seen today face to face lying in her hospital room. She is calm, cooperative, alert, awake and oriented x 4. She reports good response to her current mental health medications as evidenced by decreased hypomania symptoms, anxiety, worries, apprehensions and depressive symptoms. However, she still endorsing difficulty staying asleep all night but did not report any adverse reactions to Mirtazapine which she started yesterday. She denies psychosis, delusions and self harming thoughts.   Diagnoses:  Active Hospital problems: Active Problems:   Anxiety   Bipolar disorder ([employer])   Cocaine use disorder (HCC)   Postoperative hypothyroidism   Marijuana use     Plan  ## Safety and Observation Level:  - Based on my clinical evaluation, I estimate the patient to be at low risk of self harm in the current setting - At this time, we recommend a routine level of observation. This decision is based on my review of the chart including patient's history and current presentation, interview of the patient, mental status examination, and consideration of suicide risk including evaluating suicidal ideation, plan, intent, suicidal or self-harm behaviors, risk factors, and protective factors. This judgment is based on our ability to directly address suicide risk, implement suicide prevention strategies and develop a safety plan while the patient is in the clinical setting. Please contact our team if there is a concern that risk level has changed.  ## Medications:  -- Discontinue home Seroquel - Continue to hold home Risperdal - Continue to hold home lithium - Continue Klonopin at a reduced dose of 0.25 mg twice daily for mood/anxiety -- Continue Remeron 7.'5mg'$  at bedtime for depression/sleep -- Abilify is increased to 10 mg daily for depression, mood stabilization today, consider LAI on Monday  ## Medical  Decision Making Capacity:  Not assessed on this encounter  ## Further Work-up:  Per primary  ## Disposition:  TBD  ## Behavioral / Environmental:  --Routine obs  ##Legal Status VOL  Thank you for this consult request. Recommendations have been communicated to the primary team.  We will follow at this time.   Corena Pilgrim, MD   NEW history  Relevant Aspects of Hospital Course:  Admitted on 04/17/2022 for AMS.  Patient Report:  11/28: Pt seen in mid-AM. Notable latency of responses. She is unable to answer where she is from or where she is currently living. She appears to be in some distress, but is unable to name why she is upset. She is able to name that we are in a hospital with multiple choice options. She is unable to name what city the hospital is in. She states her name is [name] - states that is not her legal name - needed to have [place] (pronounced [place]). supplied (could not name). Later stated that she does not go my [name] and goes by [name].   Mental status exam is notable for a patient who is relatively alert but exhibits severe speech latency and is unable to follow complex commands or engage in tasks requiring minimal amounts of attention.  Physical exam is as documented below.  States "I'm cold". She does not know when her last bowel movement was. She has difficulty stating whether or not she is in pain.   Tremor has returned.   11/29: Pt with ongoing speech latency and lability - latency with some improvement. Able to keep her mind on question asked even if  there is a 10-20 second delay in answering which is a definite improvement. Now more oriented to self, location - does not know month. Gave season of "fall" and knew the year. Knows current president and got immediate prior with significant delay. No waxy flexibility observed.   Stated mood is "sad". Has not been eating much. NO SI/HI, no AH/VH.   Daughter at bedside with pt's assent.   11/30: Fully  alert and oriented. Difficulty with sustained attention. xDOWB, xaddition problems. Denies SI/HI/AVH.   12/1: Alert and answering questions appropriately. Denies SI/HI/AVH. Denies paranoia, delusions. Intermittently tearful on examination. Reports that she has been feeling depressed since she had to give up her grandkids due to financial difficulties. Reports she last took cocaine 3 months ago after she had to give up the kids. She reports increased guilt, depression, difficulty sleeping, decreased appetite (5-6lb weight loss in the past month), decreased energy, difficulty with concentration. She reports anhedonia (did not want to cook). She reports prior times where she would have a week of increased energy. She reports she has never been hospitalized for these episodes. She reports she was sleeping 6 hours a day during these times. She reported increased self-esteem. She reported marijuana use - once a day at night with edibles, vaping. She reports she is not interested in outpatient substance use counseling or inpatient rehab at this time but she is interested in outpatient telehealth therapy.   ROS:  As above  Collateral information:  Elwin Mocha, Daughter Level of contact: few times per week via phone, she lives in [place], hard to visit; sister [name] is a drug addict and steals things.  Daughter: Saturday, converse but confused  Functional September - took care of April [name] after her gallbladder surgery  Going on for two months: hall, del, no strength D/t not taking meds, taking other meds, permanent damage due to drugs I know she's not taking her meds, abusing lithium and seroquel   Manic episodes - yes, unsure if in context of drugs Drugs - cocaine, a lot - nose damage found recently Psych hosp: no SI: yes, about living with sis, OD on cocaine SA: no Patient is tired of living with [name], she's stealing money, but the patient has no financial means, cannot live with April [name] b/c she  has a son Took 3-4 days to improve at [place], only normal at day 6  Per collateral info from outpatient psychiatrist from Dr. Alvie Heidelberg:  -Patient was placed on lithium after her daughter was diagnosed with a bipolar disorder and started on lithium and it worked well for her.    Psychiatric History:  Information collected from patient, EMR  Family psych history: none  Social History:  As above  Family History:  The patient's family history includes Alcoholism in her father; Heart disease in her father; Lung cancer in her mother.  Medical History: Past Medical History:  Diagnosis Date   ADHD (attention deficit hyperactivity disorder)    Anemia    years ago   Anxiety    Arthritis    Asthma    Cancer ([place])    uterine cancer, hysterectomy - ?2005   Chronic pain 98/92/1194   Complication of anesthesia    woke up with an anxiety attack with last surgery (2013), pt. reports that she has high tolerance to medicine, also has woken during anesth.    COPD (chronic obstructive pulmonary disease) (HCC)    CRPS (complex regional pain syndrome type II)    Depression  Encephalopathy, toxic 04/18/2022   Family history of adverse reaction to anesthesia    " mother had difficulty waking "  "had alot of issues, including infection".   Fibromyalgia    GERD (gastroesophageal reflux disease)    years ago   Headache    migraines    History of hiatal hernia    years ago   Irritable bowel syndrome    Lithium toxicity 04/18/2022   Neuromuscular disorder ([place])    nerve damage in right leg   Neuropathic pain 12/10/2014   Pneumonia 2012   Shortness of breath dyspnea     Surgical History: Past Surgical History:  Procedure Laterality Date   BUNIONECTOMY Bilateral    COLONOSCOPY     DILATION AND CURETTAGE OF UTERUS     ESOPHAGOGASTRODUODENOSCOPY     FRACTURE SURGERY     right tibia   HARDWARE REMOVAL Right 04/02/2014   Procedure: AND HARDWARE REMOVAL ;  Surgeon: Rozanna Box,  MD;  Location: [facility];  Service: Orthopedics;  Laterality: Right;   HARDWARE REMOVAL Right 07/16/2014   Procedure: HARDWARE REMOVAL;  Surgeon: Rozanna Box, MD;  Location: [facility];  Service: Orthopedics;  Laterality: Right;   HARDWARE REMOVAL Right 12/05/2014   Procedure: HARDWARE REMOVAL RIGHT TIBIAL;  Surgeon: Altamese Volga, MD;  Location: [facility];  Service: Orthopedics;  Laterality: Right;   HARDWARE REMOVAL Right 03/31/2017   Procedure: HARDWARE REMOVAL RIGHT TIBIA (SCREWS ONLY);  Surgeon: Altamese Juntura, MD;  Location: [facility];  Service: Orthopedics;  Laterality: Right;   HARVEST BONE GRAFT Right 04/02/2014   Procedure:  AND ILIAC BONE GRAFT;  Surgeon: Rozanna Box, MD;  Location: [facility];  Service: Orthopedics;  Laterality: Right;   ORIF TIBIA FRACTURE Right 04/02/2014   Procedure: OPEN REDUCTION INTERNAL FIXATION (ORIF) RIGHT DISTAL TIBIA FRACTURE;  Surgeon: Rozanna Box, MD;  Location: [facility];  Service: Orthopedics;  Laterality: Right;   TIBIA IM NAIL INSERTION Right 12/05/2014   Procedure: INTRAMEDULLARY (IM) NAIL RIGHT  TIBIAL;  Surgeon: Altamese [place]stown, MD;  Location: [facility];  Service: Orthopedics;  Laterality: Right;   TIBIA OSTEOTOMY Right 07/16/2014   Procedure: RIGHT TIBIAL OSTEOTOMY;  Surgeon: Rozanna Box, MD;  Location: [facility];  Service: Orthopedics;  Laterality: Right;   TONSILLECTOMY     TUBAL LIGATION     VAGINAL HYSTERECTOMY  2000   WISDOM TOOTH EXTRACTION      Medications:   Current Facility-Administered Medications:    acetaminophen (TYLENOL) tablet 650 mg, 650 mg, Oral, Q6H PRN, 650 mg at 04/23/22 5621 **OR** acetaminophen (TYLENOL) suppository 650 mg, 650 mg, Rectal, Q6H PRN, Rick Duff, MD   [COMPLETED] ARIPiprazole (ABILIFY) tablet 5 mg, 5 mg, Oral, Once, 5 mg at 04/23/22 1402 **FOLLOWED BY** ARIPiprazole (ABILIFY) tablet 10 mg, 10 mg, Oral, Daily, Lavella Hammock, MD, 10 mg at 04/24/22 3086   clonazePAM (KLONOPIN) disintegrating tablet 0.25 mg, 0.25 mg, Oral,  BID, Lavella Hammock, MD, 0.25 mg at 04/24/22 5784   diclofenac Sodium (VOLTAREN) 1 % topical gel 2 g, 2 g, Topical, BID PRN, Gaylan Gerold, DO, 2 g at 69/62/95 2841   folic acid (FOLVITE) tablet 1 mg, 1 mg, Oral, Daily, Rick Duff, MD, 1 mg at 04/24/22 0949   ibuprofen (ADVIL) tablet 600 mg, 600 mg, Oral, Q6H PRN, Delene Ruffini, MD, 600 mg at 04/24/22 0949   levothyroxine (SYNTHROID) tablet 88 mcg, 88 mcg, Oral, QAC breakfast, Delene Ruffini, MD, 88 mcg at 04/24/22 0641   liothyronine (  CYTOMEL) tablet 5 mcg, 5 mcg, Oral, BID, Delene Ruffini, MD, 5 mcg at 04/24/22 0949   mirtazapine (REMERON) tablet 7.5 mg, 7.5 mg, Oral, QHS, Lavella Hammock, MD, 7.5 mg at 04/23/22 2108   multivitamin with minerals tablet 1 tablet, 1 tablet, Oral, Daily, Rick Duff, MD, 1 tablet at 04/24/22 0949   ondansetron (ZOFRAN) tablet 4 mg, 4 mg, Oral, Q6H PRN **OR** ondansetron (ZOFRAN) injection 4 mg, 4 mg, Intravenous, Q6H PRN, Rick Duff, MD   rivaroxaban Alveda Reasons) tablet 10 mg, 10 mg, Oral, Daily, Rick Duff, MD, 10 mg at 04/24/22 8588   thiamine (VITAMIN B1) tablet 100 mg, 100 mg, Oral, Daily, Rick Duff, MD, 100 mg at 04/24/22 5027  Allergies: Allergies  Allergen Reactions   Asa [Aspirin] Other (See Comments)    Stomach irritation/upset.     Objective  Vital signs:  Temp:  [98 F (36.7 C)-98.9 F (37.2 C)] 98.2 F (36.8 C) (12/02 1148) Pulse Rate:  [70-77] 75 (12/02 1148) Resp:  [16] 16 (12/02 1148) BP: (94-112)/(64-87) 107/87 (12/02 1148) SpO2:  [97 %-100 %] 100 % (12/02 1148)  Psychiatric Specialty Exam: Physical Exam Constitutional:      Appearance: the patient is not toxic-appearing.  Pulmonary:     Effort: Pulmonary effort is normal.  Neurological:     Alert and answering questions appropriately  Review of Systems  Respiratory:  Negative for shortness of breath.   Cardiovascular:  Negative for chest pain.  Gastrointestinal:  Negative for  abdominal pain, constipation, diarrhea, nausea and vomiting.  Neurological:  Negative for headaches.   BP 107/87 (BP Location: Right Arm)   Pulse 75   Temp 98.2 F (36.8 C) (Oral)   Resp 16   SpO2 100%   General Appearance: Fairly Groomed  Eye Contact:  Fair  Speech: Delayed latency in responses.   Volume:  Normal  Mood:  "Depressed"  Affect:  Congruent, tearful  Thought Process:  linear, goal-directed  Orientation:  Full (Time, Place, and Person)  Thought Content: denies SI/HI/AVH  Suicidal Thoughts:  denies  Homicidal Thoughts:  denies  Memory:  poor  Judgement: impaired  Insight:  impaired  Psychomotor Activity:  no psychomotor slowing or agitation observed  Concentration:  poor  Recall:  poor  Fund of Knowledge: unable to assess  Language: poor  Akathisia:  NA  Handed:    AIMS (if indicated): not done  Assets:  Armed forces logistics/support/administrative officer Desire for Improvement Financial Resources/Insurance Housing Leisure Time Physical Health  ADL's:  Intact  Cognition: WNL  Sleep:  Fair   Corena Pilgrim, MD

## 2022-04-24 NOTE — Progress Notes (Signed)
HD#6 Subjective:   Summary: April [name] is a 51 yo F with a PMH of bipolar disorder, complex regional pain syndrome/fibromyalgia, Graves' disease status post thyroidectomy (02/2021), peripheral neuropathy, anxiety, and substance use disorder (cocaine and THC) who presents after worsening confusion and imbalance found to have AKI with elevated lithium level and UDS + for benzodiazepines.    Overnight Events: No acute concerns  [name] is resting in bed comfortably. She denies being in any acute distress. She is in better spirits since spending time with your grandchild yesterday. Her neck pain has improved with voltaren gel and heating pad. Discussed placement difficulties with her Hx of substance use, will wait to see if other offers become available and continue working with PT  Objective:  Vital signs in last 24 hours: Vitals:   04/23/22 2044 04/24/22 0000 04/24/22 0437 04/24/22 0820  BP: 103/68 94/64 101/68 112/67  Pulse: 72 75 70 77  Resp: '16 16 16 16  '$ Temp: 98.9 F (37.2 C) 98 F (36.7 C) 98.4 F (36.9 C) 98 F (36.7 C)  TempSrc: Oral Oral Oral Oral  SpO2: 100% 97% 100% 100%   Physical Exam:  Constitutional: well appearing, no acute distress HENT: normocephalic atraumatic Eyes: conjunctiva non-erythematous Neck: supple Cardiovascular: regular rate Pulmonary/Chest: normal work of breathing on room air MSK: normal bulk and tone Neurological: alert & oriented x 3 Skin: warm and dry Psych: pleasant  Pertinent Labs:    Latest Ref Rng & Units 04/18/2022    8:34 AM 04/17/2022    8:42 PM 03/30/2017    2:55 PM  CBC  WBC 4.0 - 10.5 K/uL  10.0  6.4   Hemoglobin 12.0 - 15.0 g/dL 11.6  11.1  14.6   Hematocrit 36.0 - 46.0 % 34.0  32.9  41.3   Platelets 150 - 400 K/uL  424  280        Latest Ref Rng & Units 04/23/2022    4:10 AM 04/22/2022    6:23 AM 04/21/2022    7:27 AM  CMP  Glucose 70 - 99 mg/dL 103  103  108   BUN 6 - 20 mg/dL 7  6  <5   Creatinine  0.44 - 1.00 mg/dL 0.84  0.78  0.68   Sodium 135 - 145 mmol/L 136  138  133   Potassium 3.5 - 5.1 mmol/L 3.9  4.0  3.5   Chloride 98 - 111 mmol/L 106  104  106   CO2 22 - 32 mmol/L '24  23  21   '$ Calcium 8.9 - 10.3 mg/dL 8.9  9.2  8.6     Assessment/Plan:   Active Problems:   Anxiety   Bipolar disorder (HCC)   Cocaine use disorder (HCC)   Postoperative hypothyroidism   Marijuana use   Patient Summary: April [name] is a 51 yo F with a PMH of bipolar disorder, complex regional pain syndrome/fibromyalgia, Graves' disease status post thyroidectomy (02/2021), peripheral neuropathy, anxiety, and substance use disorder (cocaine and THC) who presents after worsening confusion and imbalance found to have AKI with elevated lithium level and UDS + for benzodiazepines.    Toxic encephaloapthy 2/2 lithium toxicity Encephalopathy has resolved and lithium levels normalized. She is at her baseline mentation and psychiatry consulted to assist in medication management -continue to monitor -PT/OT continue to follow with weakness and deconditioning   Bipolar 2 disorder While admitted started on mirtazapine, abilify, and decreasing clonazepam. Patient denies feeling any different since medication changes,  but mentation has improved significantly.  -continue medications as per above -appreciate psychiatry assistance, recommending establishing outpatient with psychiatrist   Hypothyroidism s/p total thyroidectomy 10/22 Continue levothyroxine 88 mcg daily. Repeat TSH in one month  Tension headache Improving with voltaren gel, tylenol,and heating pad  Diet: Normal IVF: None VTE: DOAC Code: Full PT/OT recs: SNF for Subacute PT.   Dispo: Anticipated discharge to Skilled nursing facility in 2-3 days pending placement and reevaluation by social work and PT/OT  Sanjuana Letters DO Internal Medicine Resident PGY-3 Please contact the on call pager after 5 pm and on weekends at [phone].

## 2022-04-25 MED ORDER — POLYETHYLENE GLYCOL 3350 17 G PO PACK
17.0000 g | PACK | Freq: Every day | ORAL | Status: DC
  Administered 2022-04-25 – 2022-04-26 (×2): 17 g via ORAL
  Filled 2022-04-25: qty 1

## 2022-04-25 MED ORDER — SENNOSIDES-DOCUSATE SODIUM 8.6-50 MG PO TABS
1.0000 | ORAL_TABLET | Freq: Every day | ORAL | Status: DC
  Administered 2022-04-25: 1 via ORAL
  Filled 2022-04-25: qty 1

## 2022-04-25 NOTE — Progress Notes (Addendum)
Subjective:  The patient feels well today, more like herself than she has felt in years. She reports her headache is improving with the heat, Voltaren gel, and Tylenol, but the muscles are still sensitive. She reports she has been eating much more of her food recently. She denies any adverse effects of the new psychiatric medications she was started on, but notes some constipation; she has not had a bowel movement since her admission. The patient believes she can handle being discharged home despite her complicated living situation with her daughter.   Spoke to daughter April [name] today who reports that disposition options for her mother at this point are SNF, finding someone else to live with, or returning home to a "toxic" situation. April [name] is unsure if she is able to provide short-term housing for the patient, and reports there are no other family or friends in the area for her to stay with.   Objective:  Vital signs in last 24 hours: Vitals:   04/24/22 1521 04/24/22 1918 04/25/22 0557 04/25/22 0735  BP: 96/70 100/79 123/71 120/83  Pulse: 80 77 79 72  Resp: '16 14 16 18  '$ Temp: 98.5 F (36.9 C) 98.8 F (37.1 C) 97.7 F (36.5 C) 98.3 F (36.8 C)  TempSrc: Oral Oral Oral Oral  SpO2: 100% 98% 100% 100%   Weight change:  No intake or output data in the 24 hours ending 04/25/22 1056  General: Sitting comfortably on the edge of the bed; no acute distress Cardiovascular: RRR; no murmurs, rubs, or gallops Pulmonary: Normal respiratory effort Neuro: Alert and oriented x4; no focal deficits  Psych: Speech normal rate and volume; cooperative; Answers questions appropriately   Assessment/Plan:  Active Problems:   Anxiety   Bipolar disorder ([place])   Cocaine use disorder ([facility])   Postoperative hypothyroidism   Marijuana use  [name] is a 51 yo F with a PMH of bipolar disorder, complex regional pain syndrome/fibromyalgia, Graves' disease status post thyroidectomy (02/2021),  peripheral neuropathy, anxiety, and substance use disorder (cocaine and THC) who presents after worsening confusion and imbalance found to have AKI with elevated lithium level and UDS + for benzodiazepines.     Toxic encephaloapthy 2/2 lithium toxicity - resolved The patient is A&O x 4 today and reports she feels at her baseline. The patient appears to have made great improvement in her mobility and cognitive function, so will await PT/OT recommendations to see if SNF is still appropriate. If she is not accepted to SNF, it seems she will no longer be able to stay with daughter April [name], and will likely be discharged to home. CSW will follow-up on Monday, 12/4, with one pending SNF option. Will also add on bowel regimen for constipation.  - Continue to monitor - PT/OT continue to follow with weakness and deconditioning  - Miralax for constipation   Bipolar 2 disorder  The patient is on day 3 of her adjusted medication regimen, which includes Mirtazapine, Abilify, and decreased Clonazepam. She denies any adverse side effects, but does not believe the Mirtazapine has aided in her sleep yet. Psychiatry notes a good response to the new regimen so far. CSW will follow-up with [facility] for outpatient support.  - Continue medications as per above - Appreciate psychiatry assistance, recommending establishing outpatient with psychiatrist   Hypothyroidism s/p total thyroidectomy 10/22 Continue levothyroxine 88 mcg daily. Repeat TSH in one month   Tension headache Improving with voltaren gel, tylenol,and heating pad      LOS: 7  days   April [name], Medical Student 04/25/2022, 10:56 AM

## 2022-04-25 NOTE — Plan of Care (Signed)

## 2022-04-25 NOTE — Progress Notes (Signed)
Mobility Specialist Progress Note:   04/25/22 0900  Mobility  Activity Ambulated with assistance in hallway  Level of Assistance Standby assist, set-up cues, supervision of patient - no hands on  Assistive Device None  Distance Ambulated (ft) 300 ft  Activity Response Tolerated well  Mobility Referral Yes  $Mobility charge 1 Mobility   Pt eager for mobility session. Required no physical assistance throughout session. Pt left sitting EOB with all needs met.   Nelta Numbers Mobility Specialist Please contact via SecureChat or  Rehab office at [phone]

## 2022-04-26 ENCOUNTER — Other Ambulatory Visit (HOSPITAL_COMMUNITY): Payer: Self-pay

## 2022-04-26 DIAGNOSIS — Z8669 Personal history of other diseases of the nervous system and sense organs: Secondary | ICD-10-CM

## 2022-04-26 DIAGNOSIS — G9340 Encephalopathy, unspecified: Secondary | ICD-10-CM

## 2022-04-26 MED ORDER — ARIPIPRAZOLE 10 MG PO TABS
10.0000 mg | ORAL_TABLET | Freq: Every day | ORAL | 0 refills | Status: AC
  Filled 2022-04-26: qty 30, 30d supply, fill #0

## 2022-04-26 MED ORDER — LEVOTHYROXINE SODIUM 88 MCG PO TABS
88.0000 ug | ORAL_TABLET | Freq: Every day | ORAL | 0 refills | Status: AC
  Filled 2022-04-26: qty 30, 30d supply, fill #0

## 2022-04-26 MED ORDER — MIRTAZAPINE 7.5 MG PO TABS
7.5000 mg | ORAL_TABLET | Freq: Every day | ORAL | 0 refills | Status: AC
  Filled 2022-04-26: qty 30, 30d supply, fill #0

## 2022-04-26 MED ORDER — CLONAZEPAM 0.25 MG PO TBDP
0.2500 mg | ORAL_TABLET | Freq: Two times a day (BID) | ORAL | 0 refills | Status: AC
  Filled 2022-04-26: qty 60, 30d supply, fill #0

## 2022-04-26 NOTE — TOC Transition Note (Signed)
Transition of Care [facility]) - CM/SW Discharge Note   Patient Details  Name: Kailoni Vahle MRN: [id number] Date of Birth: 08-Jan-1971  [facility] of [facility] CM/SW Contact:  Loreta [address], [place] Phone Number: 04/26/2022, 1:07 PM   Clinical Narrative:     CSW received chat from Medical Student stating pt is requesting to go home vs SNF. CSW called pt's daughter [name] who states she will pick pt around 3:30 pm, she states pt will stay with her for a couple of days and then pt will return home. All questions answered, MD updated.   Final next level of care: Skilled Nursing Facility Barriers to Discharge: SNF Pending transportation, SNF Pending bed offer, SNF Pending discharge orders, Inadequate or no insurance   Patient Goals and CMS Choice   CMS Medicare.gov Compare Post Acute Care list provided to:: Patient Choice offered to / list presented to : Patient  Discharge Placement                       Discharge Plan and Services     Post Acute Care Choice: Skilled Nursing Facility                               Social Determinants of Health (SDOH) Interventions     Readmission Risk Interventions     No data to display

## 2022-04-26 NOTE — Discharge Summary (Addendum)
Name: April [name] MRN: [id number] DOB: Oct 27, 1970 51 y.o. PCP: [facility] [place]  Date of Admission: 04/17/2022  7:53 PM Date of Discharge: 04/26/2022 Attending Physician: Velna Ochs, MD  Discharge Diagnosis: 1. Active Problems:   Anxiety   Bipolar disorder ([place])   Cocaine use disorder (HCC)   Postoperative hypothyroidism   Marijuana use    Discharge Medications: Allergies as of 04/26/2022       Reactions   Asa [aspirin] Other (See Comments)   Stomach irritation/upset.        Medication List     STOP taking these medications    BC Fast Pain Relief 650-195-33.3 MG Pack Generic drug: Aspirin-Salicylamide-Caffeine   clonazePAM 1 MG tablet Commonly known as: KLONOPIN Replaced by: clonazePAM 0.25 MG disintegrating tablet   Eszopiclone 3 MG Tabs   gabapentin 300 MG capsule Commonly known as: NEURONTIN   lithium carbonate 300 MG CR tablet Commonly known as: LITHOBID   QUEtiapine 200 MG tablet Commonly known as: SEROQUEL   VITAMIN C PO   VITAMIN D PO       TAKE these medications    albuterol 108 (90 Base) MCG/ACT inhaler Commonly known as: VENTOLIN HFA Inhale 2 puffs into the lungs every 6 (six) hours as needed for wheezing or shortness of breath.   ARIPiprazole 10 MG tablet Commonly known as: ABILIFY Take 1 tablet (10 mg total) by mouth daily. Start taking on: April 27, 2022   clonazePAM 0.25 MG disintegrating tablet Commonly known as: KLONOPIN Take 1 tablet (0.25 mg total) by mouth 2 (two) times daily. Replaces: clonazePAM 1 MG tablet   fluticasone 50 MCG/ACT nasal spray Commonly known as: FLONASE Place 1 spray into both nostrils daily as needed for allergies.   levothyroxine 88 MCG tablet Commonly known as: SYNTHROID Take 1 tablet (88 mcg total) by mouth daily before breakfast. Start taking on: April 27, 2022 What changed:  medication strength how much to take   liothyronine 5 MCG tablet Commonly known as:  CYTOMEL Take 5 mcg by mouth 2 (two) times daily.   mirtazapine 7.5 MG tablet Commonly known as: REMERON Take 1 tablet (7.5 mg total) by mouth at bedtime.   montelukast 10 MG tablet Commonly known as: SINGULAIR Take 10 mg by mouth at bedtime.      Subjective: The patient reports she feels more like herself today. She wants to be discharged home, and would decline SNF placement even if she were to be accepted. She feels hopeful that she will be able to continue her improvement at home, despite her challenging relationship with her daughter. She spoke with her daughter [name] today, and is encouraged by the fact that she agreed to go to rehab to address her drug use.    Disposition and follow-up:   April [name] was discharged from [facility] in Good condition.  At the hospital follow up visit please address:  # Lithium toxicity # Bipolar disorder - Quetiapine, lithium, and gabapentin discontinued. Please avoid lithium in the future. New psychiatric regimen is Abilify '10mg'$ , mirtazapine 7.5 QHS, and clonazepam 0.25 BID. Patient given resources for alternative psychiatry follow up and encouraged to establish with DO/MD.  Please discuss outpatient psychiatry follow-up, medication compliance, and whether her home environment is safe and stable.   # Hypothyroidism - restarted on home medications. Synthroid increased to 47mg. TSH level will need ot be rechecked in 1 month post hospitalization.  Labs / imaging needed at time of follow-up: TSH  Pending labs/ test  needing follow-up: None  Follow-up Appointments:  Follow-up [facility]. Schedule an appointment as soon as possible for a visit in 1 week(s).   Contact information: [place] [phone] [place] [facility] Course by problem list:  Toxic encephalopathy secondary to lithium toxicity The patient presented to the ED with her daughter  on 11/26 for several months of problems with speech, balance, and worsening confusion. On exam, she was disoriented, intermittently tearful, tremulous, and had speech latency. She was found to have an elevated TSH of 17.925, low T4 at 0.41, UDS positive for benzodiazepines and THC, and an elevated serum lithium level of 2.73. She was started on IV fluids, q4 lithium levels, and her psychiatric medications were held. She continued on a reduced dose of Clonazepam 0.5 BID. After fluids, her lithium level normalized to 0.71. Her mental status improved back to baseline, and lithium was discontinued from the patient's home medication list. PT/OT initially recommended SNF placement; however, the patient preferred to return home with strategies to improve her living situation. She was felt stable for discharge home.   Bipolar 2 Disorder Per daughter, she has a history of bipolar 2 disorder diagnosed 3-4 months ago, for which she was started on lithium 900 mg daily. The patient has a history of substance use disorder including cocaine and THC. We reached out to the patient's outpatient psychiatry NP, who confirmed that he has not been monitoring the patient's outpatient lithium levels. Psychiatry consulted and suggested a new regimen: Mirtazapine 7.5 mg qhs, Abilify 10 mg daily, and decreased Clonazepam to 0.25 BID. Her mentation vastly improved over the course of admission back to her baseline. CSW has offered outpatient psychiatry follow-up.  Hypothyroidism s/p total thyroidectomy 10/22 The patient has a history of Graves disease and is status post thyroidectomy in 2022. Per daughter, she has not been compliant with her medications and never followed up with surgery after the procedure. She was restarted on Levothyroxine 50 mcg and Cytomel 5 mcg for hypothyroidism. The levothyroxine was then titrated up to 88 mcg, which was more appropriate for her weight. She will have TSH rechecked in 1 month outpatient.    Hypovolemic hyponatremia  Hypophosphatemia The patient had poor PO intake prior to and at the beginning of her admission, leading to hypovolemic hyponatremia and hypophosphatemia. She was repleted with potassium phosphate and a fluid bolus, and these electrolytes corrected. Her PO intake improved over the course of admission, and she did not require any further supplementation.   Right wrist pain The patient reported some right wrist pain from a prior fall on presentation. She received a right wrist XR that showed possible mild soft tissue swelling, with no acute fracture or dislocation identified. On exam, she had some mild right wrist swelling and tenderness, no erythema or warmth, full range of motion. Her pain improved with ice, Tylenol, and Voltaren gel prn.   Tension headache The patient developed a tension headache during admission that was improved with heat, Tylenol, and Voltaren gel.   CRPS The patient's home gabapentin was held on admission. She denied any pain, and did not require any medications for her CRPS.   Discharge Exam:   BP 99/67 (BP Location: Left Arm)   Pulse 69   Temp 98 F (36.7 C) (Oral)   Resp 17   SpO2 98%  Discharge exam:   Constitutional: Appears  to be sitting up comfortably in bed; No acute distress Cardiovascular: RRR; no murmurs, rubs, or gallops; 2+ radial pulses Pulmonary: Normal respiratory effort; Lungs clear to auscultation anteriorly; no wheezes  Extremities: No lower extremity edema Neuro/Psych: A&O x4; Good eye contact; Speech normal rate and volume; cooperative; answers questions appropriately   Pertinent Labs, Studies, and Procedures:      Latest Ref Rng & Units 04/18/2022    8:34 AM 04/17/2022    8:42 PM 03/30/2017    2:55 PM  CBC  WBC 4.0 - 10.5 K/uL  10.0  6.4   Hemoglobin 12.0 - 15.0 g/dL 11.6  11.1  14.6   Hematocrit 36.0 - 46.0 % 34.0  32.9  41.3   Platelets 150 - 400 K/uL  424  280       Latest Ref Rng & Units 04/23/2022     4:10 AM 04/22/2022    6:23 AM 04/21/2022    7:27 AM  CMP  Glucose 70 - 99 mg/dL 103  103  108   BUN 6 - 20 mg/dL 7  6  <5   Creatinine 0.44 - 1.00 mg/dL 0.84  0.78  0.68   Sodium 135 - 145 mmol/L 136  138  133   Potassium 3.5 - 5.1 mmol/L 3.9  4.0  3.5   Chloride 98 - 111 mmol/L 106  104  106   CO2 22 - 32 mmol/L '24  23  21   '$ Calcium 8.9 - 10.3 mg/dL 8.9  9.2  8.6    DG Wrist Complete Right  Result Date: 04/18/2022 CLINICAL DATA:  52 year old female with hallucinations. Confusion. Fall. EXAM: RIGHT WRIST - COMPLETE 3+ VIEW COMPARISON:  None Available. FINDINGS: Bone mineralization is within normal limits. Chronic, healed fracture right 5th metacarpal. There is no evidence of acute fracture or dislocation. There is no evidence of arthropathy or other focal bone abnormality. Possible mild soft tissue swelling. IMPRESSION: Possible mild soft tissue swelling at the right wrist. No acute fracture or dislocation identified. Electronically Signed   By: Genevie Ann M.D.   On: 04/18/2022 08:03   CT Head Wo Contrast  Result Date: 04/17/2022 CLINICAL DATA:  Hallucinations EXAM: CT HEAD WITHOUT CONTRAST TECHNIQUE: Contiguous axial images were obtained from the base of the skull through the vertex without intravenous contrast. RADIATION DOSE REDUCTION: This exam was performed according to the departmental dose-optimization program which includes automated exposure control, adjustment of the mA and/or kV according to patient size and/or use of iterative reconstruction technique. COMPARISON:  None Available. CT head from 12/21/2009 could not be retrieved. FINDINGS: Brain: No evidence of acute infarction, hemorrhage, mass, mass effect, or midline shift. No hydrocephalus or extra-axial fluid collection. Vascular: No hyperdense vessel. Skull: Normal. Negative for fracture or focal lesion. Sinuses/Orbits: Minimal mucosal thickening in the maxillary sinuses. The orbits are unremarkable. Other: The mastoid air cells  are well aerated. IMPRESSION: No acute intracranial process. Electronically Signed   By: Merilyn Baba M.D.   On: 04/17/2022 21:48     Discharge Instructions: We have stopped your lithium and Quetiapine. Our inpatient psychiatry team adjusted your regimen and have started you on Abilify '10mg'$  daily, Mirtazapine 7.'5mg'$  at bedtime, and Clonazepam 0.'25mg'$  twice daily.  We recommend that you establish Care with a different psychiatry team (preferably MD/DO).  [facility] has been set up for you, but we will try to get you resources for alternative. If you do not want to go to [facility], please present to one of the  following facilities to start medication management and therapy services: [facility] [address] [place], [place], [place] [address] [phone] or [facility] [address], [place], [place] [address] [phone]  We have also adjusted your thyroid medication. We increased your levothyroxine dose to 62mg daily. Please continue taking the cytomel at the same dose. These medications are important to take every day. Please ask your PCP about having your TSH checked in 1 month.  Please follow up with a PCP in 1-2 weeks. Our clinic[facility] medicine [facility] is available for follow up if you would like.  Signed: SCorene Cornea MS3  Attestation for Student Documentation: I personally was present and performed or re-performed the history, physical exam and medical decision-making activities of this service and have verified that the service and findings are accurately documented in the student's note. GDelene Ruffini MD 04/26/2022, 12:58 PM

## 2022-04-26 NOTE — Consult Note (Signed)
[facility] Followup Face-to-Face Psychiatric Evaluation   Service Date: April 26, 2022 LOS:  LOS: 8 days   Assessment  April [name] [name] is a 51 year old female with a history of bipolar affective disorder, cocaine use disorder, and chronic pain.  No previous psychiatric hospitalizations can be found in the medical record.  Her past medical history is notable for Graves' disease s/p thyroidectomy.  She was medically admitted on 11/25 for encephalopathy thought to be due to lithium toxicity, with hyperthyroidism as another consideration.  Psychiatry consult was requested for medication assistance.  On assessment 11/28 the patient is still encephalopathic, with inattention, speech latency, trouble following complex commands. Low suspicion for catatonia or serotonin syndrome based on our exam. (No ocular clonus or clonus of the extremities, no hyperreflexia noted: no grasp reflex, mutism, immobility, or stereotypy/mannerisms).  Suspect continued toxic encephalopathy due to lithium toxicity which only recently resolved (lithium level has been normal for less than 24 hours at this point).  Suspect hypothyroidism is also contributing.  Management per primary.  Her psychotropic medication was restarted with home seroquel and klonopin at reduced doses. With regard to safety, the patient has no prior history of psychiatric hospitalization or suicide attempts or even making suicidal statements according to her daughter, April [name] [name].  Despite this, it is concerning that she took enough lithium to have a level above 2. As patient's mentation improved, she denied any intention to harm herself with the excessive ingestion, reports she was confused. Regarding outpatient follow-up, it is clear that the patient should no longer be taking lithium, given that she has been found to have supratherapeutic levels during 2 hospitalizations related to altered mental status. Both her lithium and seroquel were  discontinued. Her mentation continued to improve during hospitalization. Patient does not report a history of symptoms consistent with a manic episode but could be consistent with hypomanic episodes (this is also complicated but patient's concurrent substance use). Differential for now includes substance-induced mood d/o, bipolar affective d/o (r/o bipolar II). She is very tearful on examination, reports ongoing depressive symptoms since having to give up her grandkids a couple of months ago, she was started on remeron given reported decreased sleep and appetite as well as abilify for further mood stabilization and to augment remeron. Cannabis cessation was encouraged.  12/4 Patient's altered mental status continued to improve and she was without increased latency in responses this afternoon. Her encephalopathy was most likely in the setting of her lithium toxicity. She reports improvement in her depression with current regimen though she continues to report poor sleep. Discussed with patient that sleep will likely improve when she is out of the hospital. Discussed that she should follow-up with PCP and find a new provider to help with medication management for her mental health (resources provided to primary team for [facility] given patient is returning home).    Diagnoses:  Active Hospital problems: Active Problems:   Anxiety   Bipolar disorder ([place])   Cocaine use disorder (HCC)   Postoperative hypothyroidism   Marijuana use   Plan  ## Safety and Observation Level:  - Based on my clinical evaluation, I estimate the patient to be at low risk of self harm in the current setting - At this time, we recommend a routine level of observation. This decision is based on my review of the chart including patient's history and current presentation, interview of the patient, mental status examination, and consideration of suicide risk including evaluating suicidal ideation, plan, intent,  suicidal or  self-harm behaviors, risk factors, and protective factors. This judgment is based on our ability to directly address suicide risk, implement suicide prevention strategies and develop a safety plan while the patient is in the clinical setting. Please contact our team if there is a concern that risk level has changed.  ## Medications:  -- Discontinue home Seroquel - Discontinue home Risperdal - Discontinue home lithium - Continue Klonopin at a reduced dose of 0.25 mg twice daily for mood/anxiety -- Continue Remeron 7.'5mg'$  at bedtime for depression/sleep -- Continue Abilify 10 mg daily for depression, mood stabilization   ## Medical Decision Making Capacity:  Not assessed on this encounter  ## Further Work-up:  Per primary  ## Disposition:  - Home   ## Behavioral / Environmental:  --Routine obs  ##Legal Status VOL  Thank you for this consult request. Recommendations have been communicated to the primary team.  We will sign off at this time.   Rolanda Lundborg, MD   NEW history  Relevant Aspects of Hospital Course:  Admitted on 04/17/2022 for AMS.  Patient Report:  11/28: Pt seen in mid-AM. Notable latency of responses. She is unable to answer where she is from or where she is currently living. She appears to be in some distress, but is unable to name why she is upset. She is able to name that we are in a hospital with multiple choice options. She is unable to name what city the hospital is in. She states her name is April [name] [name] - states that is not her legal name - needed to have April [name] [name] (pronounced [place]). supplied (could not name). Later stated that she does not go my April [name] [name] and goes by April [name] [name].   Mental status exam is notable for a patient who is relatively alert but exhibits severe speech latency and is unable to follow complex commands or engage in tasks requiring minimal amounts of attention.  Physical exam is as documented below.  States "I'm cold". She does not know when her  last bowel movement was. She has difficulty stating whether or not she is in pain.   Tremor has returned.   11/29: Pt with ongoing speech latency and lability - latency with some improvement. Able to keep her mind on question asked even if there is a 10-20 second delay in answering which is a definite improvement. Now more oriented to self, location - does not know month. Gave season of "fall" and knew the year. Knows current president and got immediate prior with significant delay. No waxy flexibility observed.   Stated mood is "sad". Has not been eating much. NO SI/HI, no AH/VH.   Daughter at bedside with pt's assent.   11/30: Fully alert and oriented. Difficulty with sustained attention. xDOWB, xaddition problems. Denies SI/HI/AVH.   12/1: Alert and answering questions appropriately. Denies SI/HI/AVH. Denies paranoia, delusions. Intermittently tearful on examination. Reports that she has been feeling depressed since she had to give up her grandkids due to financial difficulties. Reports she last took cocaine 3 months ago after she had to give up the kids. She reports increased guilt, depression, difficulty sleeping, decreased appetite (5-6lb weight loss in the past month), decreased [name], difficulty with concentration. She reports anhedonia (did not want to cook). She reports prior times where she would have a week of increased [name]. She reports she has never been hospitalized for these episodes. She reports she was sleeping 6 hours a day during these times. She reported increased self-esteem. She reported marijuana use -  once a day at night with edibles, vaping. She reports she is not interested in outpatient substance use counseling or inpatient rehab at this time but she is interested in outpatient telehealth therapy.   04/24/22:    Patient seen today face to face lying in her hospital room. She is calm, cooperative, alert, awake and oriented x 4. She reports good response to her current  mental health medications as evidenced by decreased hypomania symptoms, anxiety, worries, apprehensions and depressive symptoms. However, she still endorsing difficulty staying asleep all night but did not report any adverse reactions to Mirtazapine which she started yesterday. She denies psychosis, delusions and self harming thoughts.   04/26/22:  Patient seen today lying in her hospital bed. She reports she is less depressed. She reports headache and continued difficulty sleeping. Discussed that going back to her own bed will likely help with that. She reports she feels "awake, more together." She denies SI/HI. She denies auditory or visual hallucinations. She reports she feels like this all started when her son-in-law killed himself. Discussed the importance of following up with her PCP as well as a different outpatient psychiatric provider. Discussed important side effects to watch out for with her current medication regimen.   ROS:  As above  Collateral information:  Elwin Mocha, Daughter Level of contact: few times per week via phone, she lives in [place], hard to visit; sister [name] is a drug addict and steals things.  Daughter: Saturday, converse but confused  Functional September - took care of April [name] [name] after her gallbladder surgery  Going on for two months: hall, del, no strength D/t not taking meds, taking other meds, permanent damage due to drugs I know she's not taking her meds, abusing lithium and seroquel   Manic episodes - yes, unsure if in context of drugs Drugs - cocaine, a lot - nose damage found recently Psych hosp: no SI: yes, about living with sis, OD on cocaine SA: no Patient is tired of living with [name], she's stealing money, but the patient has no financial means, cannot live with April [name] [name] b/c she has a son Took 3-4 days to improve at [place], only normal at day 6  Per collateral info from outpatient psychiatrist from Dr. Alvie Heidelberg:  -Patient was placed on  lithium after her daughter was diagnosed with a bipolar disorder and started on lithium and it worked well for her.    Daughter at bedside provides information regarding the patient's previous substance use and abuse of prescription drugs.  Called patient's outpatient provider.  He agrees that the patient should not be prescribed lithium or Seroquel.  He states the patient has not had any outpatient lithium levels despite her being on medication for 3 months.    Psychiatric History:  Information collected from patient, EMR  Family psych history: none  Social History:  As above  Family History:  The patient's family history includes Alcoholism in her father; Heart disease in her father; Lung cancer in her mother.  Medical History: Past Medical History:  Diagnosis Date   ADHD (attention deficit hyperactivity disorder)    Anemia    years ago   Anxiety    Arthritis    Asthma    Cancer ([place])    uterine cancer, hysterectomy - ?2005   Chronic pain 85/63/1497   Complication of anesthesia    woke up with an anxiety attack with last surgery (2013), pt. reports that she has high tolerance to medicine, also has woken during anesth.  COPD (chronic obstructive pulmonary disease) (HCC)    CRPS (complex regional pain syndrome type II)    Depression    Encephalopathy, toxic 04/18/2022   Family history of adverse reaction to anesthesia    " mother had difficulty waking "  "had alot of issues, including infection".   Fibromyalgia    GERD (gastroesophageal reflux disease)    years ago   Headache    migraines    History of hiatal hernia    years ago   Irritable bowel syndrome    Lithium toxicity 04/18/2022   Neuromuscular disorder ([place])    nerve damage in right leg   Neuropathic pain 12/10/2014   Pneumonia 2012   Shortness of breath dyspnea     Surgical History: Past Surgical History:  Procedure Laterality Date   BUNIONECTOMY Bilateral    COLONOSCOPY     DILATION AND CURETTAGE OF  UTERUS     ESOPHAGOGASTRODUODENOSCOPY     FRACTURE SURGERY     right tibia   HARDWARE REMOVAL Right 04/02/2014   Procedure: AND HARDWARE REMOVAL ;  Surgeon: Rozanna Box, MD;  Location: [facility];  Service: Orthopedics;  Laterality: Right;   HARDWARE REMOVAL Right 07/16/2014   Procedure: HARDWARE REMOVAL;  Surgeon: Rozanna Box, MD;  Location: [facility];  Service: Orthopedics;  Laterality: Right;   HARDWARE REMOVAL Right 12/05/2014   Procedure: HARDWARE REMOVAL RIGHT TIBIAL;  Surgeon: Altamese Dry Prong, MD;  Location: [facility];  Service: Orthopedics;  Laterality: Right;   HARDWARE REMOVAL Right 03/31/2017   Procedure: HARDWARE REMOVAL RIGHT TIBIA (SCREWS ONLY);  Surgeon: Altamese Dunn, MD;  Location: [facility];  Service: Orthopedics;  Laterality: Right;   HARVEST BONE GRAFT Right 04/02/2014   Procedure:  AND ILIAC BONE GRAFT;  Surgeon: Rozanna Box, MD;  Location: [facility];  Service: Orthopedics;  Laterality: Right;   ORIF TIBIA FRACTURE Right 04/02/2014   Procedure: OPEN REDUCTION INTERNAL FIXATION (ORIF) RIGHT DISTAL TIBIA FRACTURE;  Surgeon: Rozanna Box, MD;  Location: [facility];  Service: Orthopedics;  Laterality: Right;   TIBIA IM NAIL INSERTION Right 12/05/2014   Procedure: INTRAMEDULLARY (IM) NAIL RIGHT  TIBIAL;  Surgeon: Altamese Bovill, MD;  Location: [facility];  Service: Orthopedics;  Laterality: Right;   TIBIA OSTEOTOMY Right 07/16/2014   Procedure: RIGHT TIBIAL OSTEOTOMY;  Surgeon: Rozanna Box, MD;  Location: [facility];  Service: Orthopedics;  Laterality: Right;   TONSILLECTOMY     TUBAL LIGATION     VAGINAL HYSTERECTOMY  2000   WISDOM TOOTH EXTRACTION      Medications:   Current Facility-Administered Medications:    acetaminophen (TYLENOL) tablet 650 mg, 650 mg, Oral, Q6H PRN, 650 mg at 04/26/22 0926 **OR** acetaminophen (TYLENOL) suppository 650 mg, 650 mg, Rectal, Q6H PRN, Rick Duff, MD   [COMPLETED] ARIPiprazole (ABILIFY) tablet 5 mg, 5 mg, Oral, Once, 5 mg at 04/23/22 1402 **FOLLOWED  BY** ARIPiprazole (ABILIFY) tablet 10 mg, 10 mg, Oral, Daily, Lavella Hammock, MD, 10 mg at 04/26/22 0859   clonazePAM (KLONOPIN) disintegrating tablet 0.25 mg, 0.25 mg, Oral, BID, Lavella Hammock, MD, 0.25 mg at 04/26/22 1610   diclofenac Sodium (VOLTAREN) 1 % topical gel 2 g, 2 g, Topical, BID PRN, Gaylan Gerold, DO, 2 g at 96/04/54 0981   folic acid (FOLVITE) tablet 1 mg, 1 mg, Oral, Daily, Rick Duff, MD, 1 mg at 04/26/22 1914   ibuprofen (ADVIL) tablet 600 mg, 600 mg, Oral, Q6H PRN, Delene Ruffini, MD, 600 mg at 04/26/22 [phone]  levothyroxine (SYNTHROID) tablet 88 mcg, 88 mcg, Oral, QAC breakfast, Delene Ruffini, MD, 88 mcg at 04/26/22 0736   liothyronine (CYTOMEL) tablet 5 mcg, 5 mcg, Oral, BID, Delene Ruffini, MD, 5 mcg at 04/26/22 0900   mirtazapine (REMERON) tablet 7.5 mg, 7.5 mg, Oral, QHS, Lavella Hammock, MD, 7.5 mg at 04/25/22 2204   multivitamin with minerals tablet 1 tablet, 1 tablet, Oral, Daily, Rick Duff, MD, 1 tablet at 04/26/22 0900   ondansetron (ZOFRAN) tablet 4 mg, 4 mg, Oral, Q6H PRN **OR** ondansetron (ZOFRAN) injection 4 mg, 4 mg, Intravenous, Q6H PRN, Rick Duff, MD   polyethylene glycol (MIRALAX / GLYCOLAX) packet 17 g, 17 g, Oral, Daily, Katsadouros, Vasilios, MD, 17 g at 04/26/22 0859   rivaroxaban (XARELTO) tablet 10 mg, 10 mg, Oral, Daily, Rick Duff, MD, 10 mg at 04/26/22 0858   senna-docusate (Senokot-S) tablet 1 tablet, 1 tablet, Oral, QHS, Katsadouros, Vasilios, MD, 1 tablet at 04/25/22 2205   thiamine (VITAMIN B1) tablet 100 mg, 100 mg, Oral, Daily, Rick Duff, MD, 100 mg at 04/26/22 2585  Current Outpatient Medications:    albuterol (PROVENTIL HFA;VENTOLIN HFA) 108 (90 BASE) MCG/ACT inhaler, Inhale 2 puffs into the lungs every 6 (six) hours as needed for wheezing or shortness of breath., Disp: , Rfl:    fluticasone (FLONASE) 50 MCG/ACT nasal spray, Place 1 spray into both nostrils daily as needed for allergies.,  Disp: , Rfl:    liothyronine (CYTOMEL) 5 MCG tablet, Take 5 mcg by mouth 2 (two) times daily., Disp: , Rfl:    montelukast (SINGULAIR) 10 MG tablet, Take 10 mg by mouth at bedtime., Disp: , Rfl:    [START ON 04/27/2022] ARIPiprazole (ABILIFY) 10 MG tablet, Take 1 tablet (10 mg total) by mouth daily., Disp: 30 tablet, Rfl: 0   clonazePAM (KLONOPIN) 0.25 MG disintegrating tablet, Take 1 tablet (0.25 mg total) by mouth 2 (two) times daily., Disp: 60 tablet, Rfl: 0   [START ON 04/27/2022] levothyroxine (SYNTHROID) 88 MCG tablet, Take 1 tablet (88 mcg total) by mouth daily before breakfast., Disp: 30 tablet, Rfl: 0   mirtazapine (REMERON) 7.5 MG tablet, Take 1 tablet (7.5 mg total) by mouth at bedtime., Disp: 30 tablet, Rfl: 0  Allergies: Allergies  Allergen Reactions   Asa [Aspirin] Other (See Comments)    Stomach irritation/upset.     Objective  Vital signs:  Temp:  [98 F (36.7 C)-98.4 F (36.9 C)] 98 F (36.7 C) (12/04 0428) Pulse Rate:  [69-76] 69 (12/04 0428) Resp:  [16-18] 17 (12/04 0428) BP: (99-127)/(67-80) 99/67 (12/04 0428) SpO2:  [98 %-100 %] 98 % (12/04 0428)   Mental Status Exam   Appearance and Grooming: Patient is casually dressed. The patient has no noticeable scent or odor. Motor activity: The patient's movement speed was normal; her gait was not observed during encounter. There was no notable abnormal facial movements and no notable abnormal extremity movements. Behavior: The patient appears in no acute distress, and during the interview, was calm, focused, required minimal redirection, and behaving appropriately to scenario; she was able to follow commands and compliant to requests and made good eye contact. The patient did not appear internally or externally preoccupied. Attitude: Patient's attitude towards the interviewer was cooperative and open. Speech: The patient's speech was clear, fluent, with good articulation, and with appropriately placed inflections. The  volume of her speech was normal and normal in quantity. The rate was slow with a normal rhythm. Responses were normal in latency. There were no abnormal  patterns in speech. Mood: "Less depressed" Affect: Patient's affect is euthymic with broad range and even fluctuations; her affect is congruent with her stated mood. ------------------------------------------------------------------------------------------------------------------------- Thought Content The patient experiences no hallucinations. The patient describes no delusional thoughts Patient at the time of interview denies active suicidal intent and denies passive suicidal ideation; she denies homicidal intent. Thought Process The patient's thought process is linear and is goal-directed. Insight The patient at the time of interview demonstrates fair insight. Judgement The patient over the past 24 hours demonstrates fair judgement  Memory: intact  Executive Functions  Concentration: intact Attention Span: fair Recall: intact Fund of Knowledge: not formally assessed   Alertness/Orientation: Alert and answering questions appropriately  Physical Exam Constitutional:      Appearance: the patient is not toxic-appearing.  Pulmonary:     Effort: Pulmonary effort is normal.  Neurological:     Alert and answering questions appropriately  Review of Systems  Respiratory:  Negative for shortness of breath.   Cardiovascular:  Negative for chest pain.  Gastrointestinal:  Negative for abdominal pain, constipation, diarrhea, nausea and vomiting.  Neurological:  Positive for headache.   BP 99/67 (BP Location: Left Arm)   Pulse 69   Temp 98 F (36.7 C) (Oral)   Resp 17   SpO2 98%    Rolanda Lundborg, MD, PGY-1

## 2022-04-26 NOTE — Discharge Instructions (Addendum)
Dear Mrs. Dimino,  Thank you for trusting [place] with your care. We treated you in the hospital for lithium toxicity.   We have stopped your lithium and Quetiapine. Our inpatient psychiatry team adjusted your regimen and have started you on Abilify '10mg'$  daily, Mirtazapine 7.'5mg'$  at bedtime, and Clonazepam 0.'25mg'$  twice daily.  We recommend that you establish Care with a different psychiatry team (preferably MD/DO).  [facility] has been set up for you, but we will try to get you resources for alternative. If you do not want to go to [facility], please present to one of the following facilities to start medication management and therapy services: [facility] [address], [place], [place] [address] [phone] or [facility] [address], [place], [place] [address] [phone]  We have also adjusted your thyroid medication. We increased your levothyroxine dose to 61mg daily. Please continue taking the cytomel at the same dose. These medications are important to take every day. Please ask your PCP about having your TSH checked in 1 month.  Please follow up with a PCP in 1-2 weeks. Our clinic[facility] available for follow up if you would like.

## 2022-04-26 NOTE — TOC Progression Note (Signed)
Transition of Care [facility]) - Progression Note    Patient Details  Name: April [name] MRN: [id number] Date of Birth: [birth year]  Transition of Care [facility]) CM/SW [place], [place] Phone Number: 04/26/2022, 12:23 PM  Clinical Narrative:     CSW spoke with [place] at [facility], states no female beds at this time. CSW attempted to reach [place] at [facility] to confirm bed still available, had to leave a vm. TOC will continue to follow.   Expected Discharge Plan: [place] Barriers to Discharge: SNF Pending transportation, SNF Pending bed offer, SNF Pending discharge orders, Inadequate or no insurance  Expected Discharge Plan and Services Expected Discharge Plan: [place] Choice: [place] arrangements for the past 2 months: Single Family Home                                       Social Determinants of Health (SDOH) Interventions    Readmission Risk Interventions     No data to display

## 2022-04-26 NOTE — Progress Notes (Signed)
Mobility Specialist Progress Note:   04/26/22 1135  Mobility  Activity Ambulated independently in hallway  Level of Assistance Independent  Assistive Device None  Distance Ambulated (ft) 500 ft  Activity Response Tolerated well  Mobility Referral Yes  $Mobility charge 1 Mobility   Pt received sitting EOB and agreeable. No complaints. Pt left sitting EOB with all needs met and call bell in reach.   Andrey Campanile Mobility Specialist Please contact via SecureChat or  Rehab office at [phone]

## 2022-04-27 DIAGNOSIS — Z8669 Personal history of other diseases of the nervous system and sense organs: Secondary | ICD-10-CM

## 2022-04-28 ENCOUNTER — Telehealth: Payer: Self-pay | Admitting: Internal Medicine

## 2022-04-28 NOTE — Telephone Encounter (Cosign Needed)
Opened in error

## 2023-10-04 ENCOUNTER — Encounter: Payer: Self-pay | Admitting: Neurology

## 2024-01-04 ENCOUNTER — Encounter: Payer: Self-pay | Admitting: Neurology

## 2024-01-04 ENCOUNTER — Ambulatory Visit: Admitting: Neurology
